# Patient Record
Sex: Male | Born: 1935 | Race: White | Hispanic: No | Marital: Married | Smoking: Former smoker
Health system: Southern US, Community
[De-identification: ages and names within clinical notes are randomized; demographics above are authoritative.]

## PROBLEM LIST (undated history)

## (undated) DIAGNOSIS — I10 Essential (primary) hypertension: Secondary | ICD-10-CM

## (undated) HISTORY — DX: Essential (primary) hypertension: I10

## (undated) HISTORY — PX: KNEE SURGERY: SHX244

## (undated) HISTORY — PX: COLON SURGERY: SHX602

---

## 2007-11-02 ENCOUNTER — Encounter: Admission: RE | Admit: 2007-11-02 | Discharge: 2007-11-02 | Payer: Self-pay | Admitting: Gastroenterology

## 2007-11-17 ENCOUNTER — Encounter: Payer: Self-pay | Admitting: General Surgery

## 2007-11-17 ENCOUNTER — Ambulatory Visit: Payer: Self-pay | Admitting: Cardiovascular Disease

## 2007-11-17 ENCOUNTER — Observation Stay (HOSPITAL_COMMUNITY): Admission: EM | Admit: 2007-11-17 | Discharge: 2007-11-18 | Payer: Self-pay | Admitting: Emergency Medicine

## 2007-11-18 ENCOUNTER — Encounter: Payer: Self-pay | Admitting: Cardiovascular Disease

## 2007-11-21 ENCOUNTER — Ambulatory Visit: Payer: Self-pay

## 2007-11-29 ENCOUNTER — Encounter (INDEPENDENT_AMBULATORY_CARE_PROVIDER_SITE_OTHER): Payer: Self-pay | Admitting: General Surgery

## 2007-11-29 ENCOUNTER — Inpatient Hospital Stay (HOSPITAL_COMMUNITY): Admission: RE | Admit: 2007-11-29 | Discharge: 2007-12-04 | Payer: Self-pay | Admitting: General Surgery

## 2007-12-12 ENCOUNTER — Ambulatory Visit: Payer: Self-pay | Admitting: Oncology

## 2007-12-22 ENCOUNTER — Encounter: Admission: RE | Admit: 2007-12-22 | Discharge: 2007-12-22 | Payer: Self-pay | Admitting: General Surgery

## 2008-01-06 ENCOUNTER — Encounter: Admission: RE | Admit: 2008-01-06 | Discharge: 2008-01-06 | Payer: Self-pay | Admitting: Gastroenterology

## 2008-01-18 ENCOUNTER — Inpatient Hospital Stay (HOSPITAL_COMMUNITY): Admission: RE | Admit: 2008-01-18 | Discharge: 2008-01-21 | Payer: Self-pay | Admitting: Orthopedic Surgery

## 2008-07-06 ENCOUNTER — Encounter: Admission: RE | Admit: 2008-07-06 | Discharge: 2008-07-06 | Payer: Self-pay | Admitting: Gastroenterology

## 2009-03-18 ENCOUNTER — Encounter
Admission: RE | Admit: 2009-03-18 | Discharge: 2009-03-18 | Payer: Self-pay | Admitting: Physical Medicine and Rehabilitation

## 2010-02-23 HISTORY — PX: PROSTATE SURGERY: SHX751

## 2010-07-08 NOTE — Discharge Summary (Signed)
NAME:  David [name], David [name] NO.:  [id number]   MEDICAL RECORD NO.:  [id number]          PATIENT TYPE:  INP   LOCATION:  1612                         FACILITY:  [name]   PHYSICIAN:  Ollen Gross, M.D.    DATE OF BIRTH:  [birth year]   DATE OF ADMISSION:  01/18/2008  DATE OF DISCHARGE:  01/21/2008                               DISCHARGE SUMMARY   ADMITTING DIAGNOSES:  1. Osteoarthritis right knee.  2. Hypertension.  3. Past history of bradycardia (recent stress test and recent chest x-      ray and EKG).  4. Emphysema.  5. Urinary incontinence.  6. Renal calculi.  7. Questionable liver cysts, MRI pending, results unknown.   DISCHARGE DIAGNOSES:  1. Osteoarthritis right knee, status post right total knee replacement      arthroplasty.  2. Preop hyperkalemia, resolved.  3. Osteoarthritis right knee.  4. Hypertension.  5. Past history of bradycardia (recent stress test and recent chest x-      ray and EKG).  6. Emphysema.  7. Urinary incontinence.  8. Renal calculi.  9. Questionable liver cysts, MRI pending, results unknown.  10.Postop hyponatremia, improved.   PROCEDURE:  On 01/18/2008, right total knee surgery by Dr Lequita Halt,  assistant Avel Peace PA-C.  Anesthesia:  General.   CONSULTS:  None.   BRIEF HISTORY:  The patient is a 77 year old male with end-stage  arthritis right knee, progressive worsening pain and dysfunction, failed  outpatient regimen including injection, now presents for right total  knee arthroplasty.   LABORATORY DATA:  Preop CBC showed hemoglobin of 13.8, hematocrit 40.2,  white cell count 5.7, platelets 237.  Postop hemoglobin 11.4, then 10.4,  last H/H with a crit of 29.6.  PT/PTT preop 13 and 0.7 respectively.  INR 1. Serial protimes were followed.  PT/INR 22.5/1.9.  Chem panel on  admission showed a potassium at 3.2.  Remaining chem panel within normal  limits.  Serial electrolytes followed.  Potassium came up to 3.3, last  level  at 3.6.  Sodium dropped from 141  to 134, back up to 136. Preop  urinalysis negative.   EKG on 01/17/2008 showed sinus bradycardia, marked sinus arrhythmia,  minimal voltage criteria, no old tracing, ST elevation, consider early  repolarization.  Confirmed by Dr. Chanda Busing.   HOSPITAL COURSE:  The patient was admitted to [facility],  tolerated procedure well, later transferred to recovery and the  orthopedic floor, started on PCA for pain control.  Following surgery  doing pretty well.  On the morning of day #1 started getting up out of  bed, ambulating with therapy, did have a little low potassium preop so  we put him on some potassium supplement.  By day #2, pain was under good  control, potassium was back up to 3.6, walking over 200 feet, up to 280  on postop day #2, dressing change and incision looked good.  By day #3,  pain was under excellent control, progressing well, meeting goals with  therapy and was discharged home.   DISCHARGE/PLAN:  Patient discharged home on 01/21/2008.  DISCHARGE MEDICATIONS:  1. Percocet.  2. Robaxin.  3. Coumadin.   DIET:  As tolerated.   FOLLOWUP:  Two weeks.   DISPOSITION:  Home.   CARDIAC ASSESSMENT:  Improved.      [name], P.A.C.      Ollen Gross, M.D.  Electronically Signed    ALP/MEDQ  D:  03/07/2008  T:  03/08/2008  Job:  [id number]   cc:   Ollen Gross, M.D.  Fax: [phone]   Fredia Beets, MD   Griffith Citron, M.D.  Fax: [phone]

## 2010-07-08 NOTE — Op Note (Signed)
NAME:  ZAINE, ELSASS NO.:  [id number]   MEDICAL RECORD NO.:  [id number]           PATIENT TYPE:   LOCATION:                                 FACILITY:   PHYSICIAN:  Ollen Gross, M.D.    DATE OF BIRTH:  February 27, 1933   DATE OF PROCEDURE:  01/18/2008  DATE OF DISCHARGE:                               OPERATIVE REPORT   PREOPERATIVE DIAGNOSIS:  Osteoarthritis right knee.   POSTOPERATIVE DIAGNOSIS:  Osteoarthritis right knee.   PROCEDURE:  Right total knee arthroplasty.   SURGEON:  Ollen Gross, M.D.   ASSISTANT:  [name], P.A.C.   ANESTHESIA:  General with postop Marcaine pain pump.   ESTIMATED BLOOD LOSS:  Minimal.   DRAIN:  None.   TOURNIQUET TIME:  41 minutes at 300 mmHg.   COMPLICATIONS:  None.   CONDITION:  Stable to recovery.   BRIEF CLINICAL NOTE:  Mr. Bielby is a 77 year old male with end-stage  arthritis of the right knee with progressively worsening pain and  dysfunction.  He has failed nonoperative management including injections  and presents now for right total knee arthroplasty.   PROCEDURE IN DETAIL:  After successful administration of general  anesthetic a tourniquet was placed high on the right thigh and right  lower extremity prepped and draped in the usual sterile fashion.  Extremity was wrapped in Esmarch, knee flexed, tourniquet inflated to  300 mmHg.  Midline incision was made with a 10 blade through  subcutaneous tissue to the level of the extensor mechanism.  A fresh  blade was used to make a medial parapatellar arthrotomy.  Soft tissue of  the proximal medial tibia is subperiosteally elevated to the joint line  with the knife and into the semimembranosus bursa with a Cobb elevator.  Soft tissue laterally is elevated with attention being paid to avoid the  patellar tendon on tibial tubercle.  Patella was subluxed laterally and  knee flexed 90 degrees.  ACL and PCL removed.  Drill was used to create  a starting  hole in the distal femur.  The canal was thoroughly  irrigated.  The 5 degree right valgus alignment guide is placed  referencing off the posterior condyles rotation is marked and the block  pinned to remove 11 mm off the distal femur.  I took 11 because of preop  flexion contracture.  Distal femoral resection is made with an  oscillating saw.  Sizing block was placed, size 5 is most appropriate.  Rotation is marked at the epicondylar axis.  Size 5 cutting block was  placed and the anterior-posterior and chamfer cuts were made.   The tibia is subluxed forward and the menisci removed.  Extramedullary  tibial alignment guide is placed referencing proximally at the medial  aspect of the tibial tubercle and distally along the second metatarsal  axis and tibial crest.  Block is pinned to remove 10 mm off the  nondeficient lateral side.  Tibial resection is made with an oscillating  saw.  Size 5 is the most appropriate tibial component and the proximal  tibia is prepared  with the modular drill and keel punch for the size 5.  Femoral preparation is completed with the intercondylar cut.   Size 5 mobile bearing tibial trial, size 5 posterior stabilized femoral  trial and a 10 mm posterior stabilized rotating platform insert trial  are placed.  With the 10 full extension is achieved with excellent varus-  valgus, anterior-posterior balance throughout full range of motion.  The  patella was everted and thickness measured to be 26 mm.  Freehand  resection was taken to 14 mm, 41 template was placed, lug holes were  drilled, trial patella was placed and it tracks normally.  Osteophytes  removed off the posterior femur with the trial in place.  All trials  were removed and the cut bone surfaces are prepared with pulsatile  lavage.  Cement is mixed and once ready for implantation the size 5  mobile bearing tibial tray, size 5 posterior stabilized femur and 41  patella are cemented into place.  Patella  was held with a clamp.  Trial  10 mm insert was placed and knee held in full extension and all extruded  cement removed.  When the cement has fully hardened then the permanent  10 mm posterior stabilized rotating platform insert was placed into the  tibial tray.  The wound was copiously irrigated with saline solution and  then FloSeal injected onto the posterior capsule of the medial and  lateral gutters and suprapatellar area.  Moist sponge is placed and  tourniquet released for the total time of 41 minutes.  Minimal bleeding  was encountered.  The bleeding that is encountered was stopped with  electrocautery.  The wound was again irrigated and the arthrotomy closed  with interrupted #1 PDS.  Flexion against gravity to 140 degrees.  Subcu  was closed with interrupted 2-0 Vicryl, subcuticular running 4-0  Monocryl.  The incision was cleaned and dried and then the catheter for  the Marcaine pain pump is placed and the pump was initiated.  Steri-  Strips and bulky sterile dressing are applied and he was placed into a  knee immobilizer, awakened and transported to recovery in stable  condition.      Ollen Gross, M.D.  Electronically Signed     FA/MEDQ  D:  01/18/2008  T:  01/18/2008  Job:  [id number]

## 2010-07-08 NOTE — Discharge Summary (Signed)
NAME:  David [name], David [name] NO.:  [id number]   MEDICAL RECORD NO.:  [id number]          PATIENT TYPE:  INP   LOCATION:  5149                         FACILITY:  [facility]   PHYSICIAN:  Ollen Gross. Vernell Morgans, M.D. DATE OF BIRTH:  13-Sep-1933   DATE OF ADMISSION:  11/29/2007  DATE OF DISCHARGE:  12/04/2007                               DISCHARGE SUMMARY   [name] is a 77 year old gentleman who had a right colon adenoma with  high-grade dysplasia.  He was brought to the operating room and  underwent a right laparoscopic-assisted right colectomy on November 29, 2007.  He tolerated the surgery well.  He was placed on the enteric  protocol prior to surgery.  On postop day 1, his blood pressure was a  little bit high.  He was restarted on his home doses of atenolol and  hydrochlorothiazide.  He was maintained on clears until December 02, 2007,  at which time his bowel function began to return.  He was advanced to  full liquids.  He gradually improved and by December 04, 2007, he was  tolerating a diet, ambulating without difficulty.  His pain was under  control with p.o. pain medicine and he was ready for discharge home.   MEDICATIONS:  He is to resume his home meds.  He was given a  prescription for pain medicine.   ACTIVITIES:  No heavy lifting.   DIET:  As tolerated.   FINAL DIAGNOSIS:  Right colon adenoma with dysplasia and follow up with  Dr. Carolynne Edouard in a week and he is discharged home.      Ollen Gross. Vernell Morgans, M.D.  Electronically Signed     PST/MEDQ  D:  01/31/2008  T:  01/31/2008  Job:  [id number]

## 2010-07-08 NOTE — Op Note (Signed)
NAME:  David [name], David [name]                 ACCOUNT NO.:  [id number]   MEDICAL RECORD NO.:  [id number]          PATIENT TYPE:  INP   LOCATION:  5120                         FACILITY:  [facility]   PHYSICIAN:  Ollen Gross. Vernell Morgans, M.D. DATE OF BIRTH:  Sep 02, 1933   DATE OF PROCEDURE:  11/29/2007  DATE OF DISCHARGE:                               OPERATIVE REPORT   PREOPERATIVE DIAGNOSES:  Right colon adenoma with high-grade dysplasia.   POSTOPERATIVE DIAGNOSES:  Right colon adenoma with high-grade dysplasia.   PROCEDURE:  Laparoscopic-assisted right colectomy.   SURGEON:  Ollen Gross. Vernell Morgans, MD   ASSISTANT:  Alfonse Ras, MD   ANESTHESIA:  General endotracheal.   PROCEDURE:  After informed consent was obtained, the patient was brought  to the operating room, and placed in a supine position on the operating  table.  After adequate induction of general anesthesia, the patient's  abdomen was prepped with Betadine and draped in usual sterile manner.  The area above the umbilicus was infiltrated with 0.25% Marcaine.  A  small incision was made with a 15-blade knife.  This incision was  carried down through the subcutaneous tissue bluntly with hemostat and  Army-[employer]y retractors until linea alba was identified.  The linea alba  was incised with a 15-blade knife.  Each side was grasped with Kocher  clamps and elevated anteriorly.  The preperitoneal space was then probed  bluntly with hemostat until the peritoneum was opened and access was  gained to the abdominal cavity.  A 0 Vicryl pursestring stitch was  placed in the fascia around the opening site.  A Hasson cannula was  placed through the opening and anchored in place at the previous with 0  Vicryl pursestring stitch.  The abdomen was then insufflated with carbon  dioxide without difficulty.  The patient was rotated with the right side  up.  Three sites were then chosen on the lower portion of the abdomen  for placement of a 5-mm port.  Each of  these areas were infiltrated with  0.25% Marcaine.  Small stab incisions were made with a 15-blade knife  and 5-mm ports were placed bluntly through these incisions into the  abdominal cavity under direct vision.  Using a Glassman grasper and  harmonic scalpel, the right colon was identified and was mobilized  medially by incising its retroperitoneal attachment along the white line  of Toldt.  The attachments of omentum to the right colon were taken down  also sharply with the harmonic scalpel.  The plane between the  transverse colon and duodenum and gallbladder was identified and this  was also opened up sharply with the harmonic scalpel under direct  vision.  This allowed [place] to bluntly mobilize the colon more medially.  Once this was accomplished, a right midabdomen transverse incision was  made laterally on the abdomen overlying the right colon.  This was done  with a 10-blade knife.  This incision was carried down through the skin,  subcutaneous tissue, fascia, and muscle sharply with the electrocautery  into the abdomen was entered.  We were able  to bring the small bowel and  terminal ileum up into the wound.  The site was chosen for division of  the small bowel at this point.  The mesentery at this point was opened  sharply with the electrocautery.  A GIA-75 stapler was placed across the  small bowel at this point, clamped and fired, thereby dividing the bowel  between staple lines.  The hepatic flexure was then also able to be  brought up into the wound.  A site was chosen for division of the colon  at this point and the mesentery was opened sharply with the  electrocautery.  A GIA stapler was then placed across the colon at this  point, clamped and fired, thereby dividing the colon between staple  lines and mesentery.  The right colon was then taken down with the  LigaSure.  The main right colic artery was then clamped near its base  with a Kelly clamp.  It was ligated and divided  with the LigaSure and  then the stay side of the artery was doubly ligated with two 2-0 silk  ties.  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  No other abnormalities  were noted on inspecting the abdominal cavity.  The terminal ileum and  transverse colon reached each other easily without any tension.  The two  limbs of bowel were held grossly approximated with a couple of  interrupted 3-0 silk stitches.  Small enterotomies were made on the  antimesenteric border near the staple line of each limb of intestine.  The GIA 75 stapler was then placed down the appropriate limb of colon or  small bowel.  It was then clamped and fired, thereby creating a nice  widely patent enteroenterostomy.  The anastomosis was inspected and  appeared to be intact and hemostatic.  The common enterotomy was then  closed with a TA-60 stapler.  Crotch stitch of 3-0 silk was then placed  at the distal into the anastomosis.  The mesentery was then closed with  multiple simple 2-0 silk stitches.  Once this was accomplished, the  anastomosis was then tacked, was able to be drop back into the abdomen  without difficulty.  The would was then irrigated with copious amounts  of saline.  The operative bed appeared to be hemostatic.  The ports were  then removed.  The fascial defect at the supraumbilical port was closed  with the pursestring stitch.  The deep fascial layer of the abdominal  wall was then closed with a running #1 double-stranded loop PDS.  The  superficial fascial layer was then closed with a single-stranded #1 PDS  stitch.  Each layer was irrigated with copious amounts of saline.  The  subcutaneous tissue was irrigated with saline.  Skin was then closed  with staples.  Sterile dressings were applied.  The patient tolerated  the procedure well.  At the end of the case, all  needle, sponge, and  instrument counts were correct.  The patient was then awakened and taken  to the recovery room in stable condition.      Ollen Gross. Vernell Morgans, M.D.  Electronically Signed     PST/MEDQ  D:  11/29/2007  T:  11/30/2007  Job:  [id number]

## 2010-07-08 NOTE — H&P (Signed)
NAME:  David [name], David [name] NO.:  [id number]   MEDICAL RECORD NO.:  [id number]          PATIENT TYPE:  INP   LOCATION:  NA                           FACILITY:  [facility]   PHYSICIAN:  Ollen Gross, M.D.    DATE OF BIRTH:  [birth year]   DATE OF ADMISSION:  01/18/2008  DATE OF DISCHARGE:                              HISTORY & PHYSICAL   DATE OF ADMISSION:  January 18, 2008   CHIEF COMPLAINTS:  Right knee pain.   HISTORY OF PRESENT ILLNESS:  The patient is a 77-year male seen by Dr.  Lequita Halt for ongoing bilateral knee pain right, more problematic and  symptomatic than the left knee.  Has been ongoing for quite some time  now.  He has been seen in the office and evaluated by Dr. Lequita Halt and  found to have moderate advanced arthritis in the right knee, medial and  patellofemoral compartments.  He has reached a point where he is having  significant pain, felt to benefit from surgery.  Risks and benefits have  been discussed and he elects to surgery.   ALLERGIES:  NO KNOWN DRUG ALLERGIES.   CURRENT MEDICATIONS:  Probiotic formula red yeast rice, glucosamine,  Resveratrol,  potassium, atenolol/chlorthalidone, finasteride, Vesicare,  calcium, aspirin, grape seed extract, Prostate Complete, zinc, psyllium,  magnesium,  Estra-C, gluten, folic acid, and B complex.   PAST MEDICAL HISTORY:  1. Hypertension.  2. Prior history of bradycardia for which he was hospitalized      approximately 5 to 6 weeks ago.  Did have a recent stress test so      he has had a recent chest x-ray and EKG.  3. Emphysema.  4. Urinary incontinence.  5. Kidney stones.  6. Questionable liver cyst.  He had an MRI pending at the time of his      preop evaluation, results unknown.   PAST SURGICAL HISTORY:  1. Appendectomy.  2. Tonsillectomy.  3. Kidney stone surgery.  4. Colon surgery with a partial colectomy for precancerous tumor.   FAMILY HISTORY:  Father deceased age 89 with MI.  Mother deceased  at 41  of breast cancer.   SOCIAL HISTORY:  Married, quit smoking about 40 years ago.  No alcohol.  Three children.  He does have a caregiver lined up has a one-story home  with one step entering   REVIEW OF SYSTEMS:  GENERAL:  No fever, chills or night sweats.  NEUROLOGIC:  No seizures, syncope or paralysis.  RESPIRATORY:  A little  bit of shortness of breath on exertion but no shortness of breath at  rest.  No  productive cough or hemoptysis.  CARDIOVASCULAR:  No chest  pain, angina or orthopnea.  GI:  No nausea, vomiting, diarrhea, or  constipation.  GU:  No dysuria, hematuria or discharge.  MUSCULOSKELETAL:  Pertinent to that of the left hip found in history of  present illness.   PHYSICAL EXAMINATION:  VITAL SIGNS:  Pulse 60, respirations 12, blood  pressure 156/60.  GENERAL:  A 77 year old white male,  well-nourished, well-developed in  no acute distress.  Alert, oriented, cooperative, pleasant.  HEENT:  Normocephalic, atraumatic.  Pupils are round and reactive.  Oropharynx clear.  EOMs are intact.  NECK:  Supple.  CHEST:  Clear.  Anterior and posterior chest wall.  No rhonchi, rales or  wheezing.  HEART:  Regular rate and rhythm with a grade 2/6 systolic ejection  murmur best heard aortic, pulmonic and slightly over Erb's point.  ABDOMEN:  Slightly round.  Bowel sounds present.  RECTAL/BREASTS/GENITALIA:  Not done and not pertinent to present  illness.  EXTREMITIES:  Right knee range of motion 5-125, slight varus  malalignment deformity.  Marked crepitus is noted.   IMPRESSION:  Osteoarthritis, right knee.   PLAN:  The patient will be admitted to [facility] to undergo  right total knee replacement arthroplasty.  Surgery will be performed by  Dr. Ollen Gross.      [name], P.A.C.      Ollen Gross, M.D.  Electronically Signed    ALP/MEDQ  D:  01/15/2008  T:  01/16/2008  Job:  [id number]   cc:   Dr. Jenne Campus   Dr. Duffy Bruce

## 2010-07-08 NOTE — H&P (Signed)
NAME:  David [name], David [name] NO.:  [id number]   MEDICAL RECORD NO.:  [id number]          PATIENT TYPE:  INP   LOCATION:  3733                         FACILITY:  [facility]   PHYSICIAN:  Verne Carrow, MDDATE OF BIRTH:  18-May-1933   DATE OF ADMISSION:  11/17/2007  DATE OF DISCHARGE:                              HISTORY & PHYSICAL   PRIMARY CARDIOLOGIST:  Verne Carrow, MD   PRIMARY CARE PHYSICIAN:  Eustaquio Boyden, MD   HISTORY OF PRESENT ILLNESS:  This is a 77 year old Caucasian male with  history of hypertension, hyperlipidemia, and colon mass who was sent to  the hospital today for preop labs for planned colon resection next week.  In short-stay, the patient was found to have an abnormal EKG with poor R-  wave progression and ST elevation in lead 2 only.  The patient has a  nonspecific ST changes inferiorly.  The patient has not had any  complaints of chest pain, shortness of breath, dizziness, diaphoresis,  nausea or vomiting, and no prior history of heart disease with mild  fatigue over 1 year.  As a result of the abnormal EKG, we are asked to  see the patient and admit for further evaluation and testing prior to  the patient's planned surgery.   Review of systems are negative for all systems, which have been  interviewed by Dr. Clifton James.   PAST MEDICAL HISTORY:  1. Hypertension.  2. Hyperlipidemia.  3. Colon mass of unknown etiology for planned colon resection in 1      week.   PAST SURGICAL HISTORY:  Appendectomy.   SOCIAL HISTORY:  He is a 60-pack-year smoker, but stopped in 1970.  He  is married, living with his wife.  He does not smoke.  He does not drink  alcohol.   FAMILY HISTORY:  Mother deceased with breast cancer at 109.  Father  deceased from an MI at age 33.  He has a sister who is deceased from  breast cancer.   CURRENT MEDICATIONS AT HOME:  1. Atenolol 50 mg once a day.  2. Klor-Con 10 mEq daily.  3. Propecia daily.   ALLERGIES:  No known drug allergies.   LABS:  Point of care; CK 130, MB 1.8, troponin less than 0.05.  CBC and  chemistries are pending.  EKG revealing normal sinus rhythm with  ventricular rate of 52 beats per minute with ST elevation in V2 only  with nonspecific changes in 2, 3 and aVF.   PHYSICAL EXAMINATION:  GENERAL:  He is an awake, alert, and oriented in  no acute distress.  HEENT:  Head is normocephalic and atraumatic.  Eyes, PERRLA.  Mucous  membranes and mouth pink and moist.  Tongue is midline.  NECK:  Supple without JVD or carotid bruits appreciated.  CARDIOVASCULAR:  Regular rate and rhythm without murmurs, rubs or  gallops.  LUNGS:  Clear to auscultation without wheezes, rales, or rhonchi.  ABDOMEN:  Soft, nontender, 2+ bowel sounds.  EXTREMITIES:  Without clubbing, cyanosis, or edema.  NEUROLOGIC:  Cranial nerves II through XII are grossly intact.  Chest x-ray results are pending.   PLAN:  This is a 77 year old Caucasian male with history of hypertension  and hyperlipidemia with strong family history of CAD without any  symptoms of pain, shortness of breath, who was admitted from short-stay  secondary to abnormal an EKG showing nonspecific changes inferiorly and  ST elevation in V2 only.  The patient will be admitted for 23-hour  observation.  We will rule out MI with serial cardiac enzymes and check  echocardiogram today.  If all is negative, may consider stress test in  the a.m. versus cardiac catheterization if results are positive with  cardiac enzyme.  In the interim, we will do an echocardiogram for LV  function and observe making further recommendations based upon test  results.      Bettey Mare. Lyman Bishop, NP      Verne Carrow, MD  Electronically Signed    KML/MEDQ  D:  11/17/2007  T:  11/18/2007  Job:  [id number]   cc:   Eustaquio Boyden, MD

## 2010-07-08 NOTE — Discharge Summary (Signed)
NAME:  David, [name] NO.:  [id number]   MEDICAL RECORD NO.:  [id number]          PATIENT TYPE:  INP   LOCATION:  3733                         FACILITY:  [facility]   PHYSICIAN:  David [name], MDDATE OF BIRTH:  Feb 11, 1934   DATE OF ADMISSION:  11/17/2007  DATE OF DISCHARGE:  11/18/2007                               DISCHARGE SUMMARY   PRIMARY CARDIOLOGIST:  David Carrow, MD   NEW PRIMARY CARE David [name]:  David Boyden, MD   SURGEON:  David Gross. Vernell Morgans, MD   DISCHARGE DIAGNOSIS:  Abnormal electrocardiogram.   SECONDARY DIAGNOSES:  1. Hypertension.  2. Hyperlipidemia.  3. History of colon mass with unknown etiology, plan for colon      resection next week.  4. Status post appendectomy.  5. Tobacco abuse.  6. Family history of coronary artery disease.   ALLERGIES:  No known drug allergies.   PROCEDURE:  2D echocardiogram.   HISTORY OF PRESENT ILLNESS:  A 77 year old Caucasian male without prior  history of coronary artery disease.  He was recently found to have colon  mass and was scheduled for colon resection next week.  He presented to  [facility] Short Stay for preoperative labs and ECG and was noted to  have an abnormal ECG with poor R wave progression and ST elevation in  lead V2 only with nonspecific ST changes in inferior leads.  The patient  denied any chest pain, otherwise hypertensive.  Decision was made to  observe him overnight and cycle cardiac markers.   HOSPITAL COURSE:  The patient ruled out for MI.  He has remained  asymptomatic.  He will undergo 2D echocardiogram prior to discharge and  we have arranged him for an outpatient Myoview on November 21, 2007, at  8 a.m.   DISCHARGE LABORATORIES:  Hemoglobin 15.9, hematocrit 45.9, WBC 7.7, and  platelets 240.  INR 1.0.  Sodium 138, potassium 3.3 which will replace  prior to discharge, chloride 101, CO2 of 29, BUN 12, creatinine 1.09,  glucose 106, calcium 8.9, total bilirubin  120, alkaline phosphatase 66,  AST 51, ALT 19, total protein 6.7, albumin 4.0, and magnesium 2.2.  CK  137, MB 1.8, troponin I 0.02.  Total cholesterol 218, triglycerides 227,  HDL 32, and LDL 141.  CEA 2.0.   DISPOSITION:  The patient is being discharged home today in good  condition.   FOLLOWUP APPOINTMENTS:  We will arrange for an outpatient exercise  Myoview on November 21, 2007, at 8 a.m.  Recommended that the patient  hold his atenolol and avoid caffeine on November 20, 2007.   DISCHARGE MEDICATIONS:  1. Aspirin 81 mg daily.  2. Atenolol/chlorthalidone 50/25 mg daily.  3. Propecia 1 mg daily.   OUTSTANDING LABORATORY STUDIES:  He is pending Myoview and echo.   DURATION OF DISCHARGE/ENCOUNTER:  Thirty-five minutes including  physician time.      David [name], ANP      David Carrow, MD  Electronically Signed    CB/MEDQ  D:  11/18/2007  T:  11/18/2007  Job:  [id number]   cc:  David [name], M.D.  David Boyden, MD

## 2010-11-24 LAB — POCT CARDIAC MARKERS
CKMB, poc: 1.8
Troponin i, poc: <0.05

## 2010-11-24 LAB — CARDIAC PANEL(CRET KIN+CKTOT+MB+TROPI)
CK, MB: 1.8
CK, MB: 2.1
Relative Index: 1.6
Relative Index: 1.7
Total CK: 128
Troponin I: 0.01

## 2010-11-24 LAB — LIPID PANEL
Cholesterol: 218 — ABNORMAL HIGH
LDL Cholesterol: 141 — ABNORMAL HIGH
Total CHOL/HDL Ratio: 6.8
Triglycerides: 227 — ABNORMAL HIGH

## 2010-11-24 LAB — BASIC METABOLIC PANEL
CO2: 29
Calcium: 8.9
Creatinine, Ser: 1.09
GFR calc non Af Amer: >60
Glucose, Bld: 106 — ABNORMAL HIGH

## 2010-11-24 LAB — DIFFERENTIAL
Eosinophils Absolute: 0.4
Lymphs Abs: 1.4
Monocytes Absolute: 0.8
Monocytes Relative: 10

## 2010-11-24 LAB — COMPREHENSIVE METABOLIC PANEL
BUN: 12
CO2: 30
Chloride: 102
Creatinine, Ser: 1.06
Total Bilirubin: 1.8 — ABNORMAL HIGH

## 2010-11-24 LAB — CBC
MCHC: 34.6
MCV: 83.8
RDW: 15

## 2010-11-24 LAB — PROTIME-INR: Prothrombin Time: 14.2

## 2010-11-24 LAB — TSH: TSH: 1.624

## 2010-11-25 LAB — CBC
HCT: 35.6 — ABNORMAL LOW
HCT: 38.2 — ABNORMAL LOW
HCT: 29.6 — ABNORMAL LOW
HCT: 40.2
Hemoglobin: 13.1
Hemoglobin: 10.4 — ABNORMAL LOW
MCHC: 34
MCHC: 34.3
MCHC: 34.4
MCHC: 34.7
MCHC: 35
MCV: 84.2
MCV: 84.4
MCV: 86.5
MCV: 84
MCV: 84.8
Platelets: 157
Platelets: 227
Platelets: 206
Platelets: 237
Platelets: 241
RBC: 4.12 — ABNORMAL LOW
RBC: 4.53
RBC: 3.52 — ABNORMAL LOW
RDW: 14.2
RDW: 14.7
RDW: 14.9
RDW: 15.4
RDW: 16 — ABNORMAL HIGH
RDW: 16 — ABNORMAL HIGH
WBC: 11 — ABNORMAL HIGH
WBC: 11.2 — ABNORMAL HIGH
WBC: 5.7

## 2010-11-25 LAB — BASIC METABOLIC PANEL
BUN: 14
BUN: 17
BUN: 13
CO2: 31
CO2: 31
Calcium: 8.4
Calcium: 8.9
Calcium: 8.9
Calcium: 8.1 — ABNORMAL LOW
Calcium: 8.1 — ABNORMAL LOW
Chloride: 100
Chloride: 97
Chloride: 99
Creatinine, Ser: 1.14
Creatinine, Ser: 1.26
Creatinine, Ser: 1.29
Creatinine, Ser: 1.3
GFR calc Af Amer: 57 — ABNORMAL LOW
GFR calc Af Amer: >60
GFR calc non Af Amer: 47 — ABNORMAL LOW
GFR calc non Af Amer: 56 — ABNORMAL LOW
GFR calc non Af Amer: >60
GFR calc non Af Amer: 55 — ABNORMAL LOW
Glucose, Bld: 102 — ABNORMAL HIGH
Glucose, Bld: 135 — ABNORMAL HIGH
Glucose, Bld: 96
Glucose, Bld: 121 — ABNORMAL HIGH
Potassium: 2.9 — ABNORMAL LOW
Potassium: 3.7
Sodium: 138
Sodium: 139

## 2010-11-25 LAB — DIFFERENTIAL
Basophils Absolute: 0
Basophils Absolute: 0
Basophils Absolute: 0.1
Basophils Relative: 0
Basophils Relative: 0
Basophils Relative: 1
Eosinophils Absolute: 0.3
Eosinophils Absolute: 0.3
Eosinophils Absolute: 0.3
Eosinophils Relative: 3
Eosinophils Relative: 4
Lymphocytes Relative: 10 — ABNORMAL LOW
Lymphocytes Relative: 7 — ABNORMAL LOW
Lymphs Abs: 1.1
Lymphs Abs: 1.2
Monocytes Absolute: 1.1 — ABNORMAL HIGH
Monocytes Relative: 11
Monocytes Relative: 13 — ABNORMAL HIGH
Neutro Abs: 9.9 — ABNORMAL HIGH
Neutrophils Relative %: 77
Neutrophils Relative %: 81 — ABNORMAL HIGH
Neutrophils Relative %: 84 — ABNORMAL HIGH

## 2010-11-25 LAB — APTT: aPTT: 30

## 2010-11-25 LAB — COMPREHENSIVE METABOLIC PANEL
ALT: 16
AST: 20
Albumin: 4.1
Albumin: 3.7
Alkaline Phosphatase: 66
Alkaline Phosphatase: 58
BUN: 18
Calcium: 9.3
GFR calc Af Amer: >60
Potassium: 3.2 — ABNORMAL LOW
Potassium: 3.2 — ABNORMAL LOW
Sodium: 138
Total Protein: 6.9
Total Protein: 6.5

## 2010-11-25 LAB — PROTIME-INR
INR: 1
INR: 1.2
INR: 1.9 — ABNORMAL HIGH
Prothrombin Time: 15.5 — ABNORMAL HIGH
Prothrombin Time: 19.4 — ABNORMAL HIGH

## 2010-11-25 LAB — URINALYSIS, ROUTINE W REFLEX MICROSCOPIC
Bilirubin Urine: NEGATIVE
Hgb urine dipstick: NEGATIVE
Protein, ur: NEGATIVE
Urobilinogen, UA: 0.2

## 2010-11-25 LAB — GLUCOSE, CAPILLARY: Glucose-Capillary: 153 — ABNORMAL HIGH

## 2010-11-25 LAB — ABO/RH: ABO/RH(D): O POS

## 2010-11-25 LAB — TYPE AND SCREEN: Antibody Screen: NEGATIVE

## 2012-01-04 ENCOUNTER — Encounter (INDEPENDENT_AMBULATORY_CARE_PROVIDER_SITE_OTHER): Payer: Self-pay | Admitting: General Surgery

## 2012-01-04 ENCOUNTER — Ambulatory Visit (INDEPENDENT_AMBULATORY_CARE_PROVIDER_SITE_OTHER): Payer: Medicare Other | Admitting: General Surgery

## 2012-01-04 VITALS — BP 140/76 | HR 60 | Temp 98.6°F | Resp 18 | Ht 72.0 in | Wt 220.0 lb

## 2012-01-04 DIAGNOSIS — M6208 Separation of muscle (nontraumatic), other site: Secondary | ICD-10-CM

## 2012-01-04 DIAGNOSIS — M6200 Separation of muscle (nontraumatic), unspecified site: Secondary | ICD-10-CM

## 2012-01-04 DIAGNOSIS — K439 Ventral hernia without obstruction or gangrene: Secondary | ICD-10-CM

## 2012-01-04 HISTORY — DX: Separation of muscle (nontraumatic), other site: M62.08

## 2012-01-04 NOTE — Progress Notes (Signed)
Subjective:     Patient ID: David [name], male   DOB: October 17, 1933, 78 y.o.   MRN: [id number]  HPI We are asked to see this patient in consultation by Dr. Judith Blonder to evaluate him for a ventral hernia. The patient is a 78 year old white male who has noticed for a number of years a bulge along his upper abdomen. He does note some occasional discomfort with it. He is also occasionally experience some nausea but no vomiting. He has been able to maintain his weight. His bowels are moving regularly.  Review of Systems  Constitutional: Negative.   HENT: Negative.   Eyes: Negative.   Respiratory: Negative.   Cardiovascular: Negative.   Gastrointestinal: Negative.   Genitourinary: Negative.   Musculoskeletal: Negative.   Skin: Negative.   Neurological: Negative.   Hematological: Negative.   Psychiatric/Behavioral: Negative.        Objective:   Physical Exam  Constitutional: He is oriented to person, place, and time. He appears well-developed and well-nourished.  HENT:  Head: Normocephalic and atraumatic.  Eyes: Conjunctivae normal and EOM are normal. Pupils are equal, round, and reactive to light.  Neck: Normal range of motion. Neck supple.  Cardiovascular: Normal rate, regular rhythm and normal heart sounds.   Pulmonary/Chest: Effort normal and breath sounds normal.  Abdominal: Soft. Bowel sounds are normal.       The patient has a normal rectus diastases along his upper midline abdomen. No evidence of hernia.  Musculoskeletal: Normal range of motion.  Neurological: He is alert and oriented to person, place, and time.  Skin: Skin is warm and dry.  Psychiatric: He has a normal mood and affect. His behavior is normal.       Assessment:     The patient has a rectus diastases of his upper abdominal wall. This is a variant of normal. Unfortunately there is nothing to do surgically to try to fix this without weakening of his abdominal wall.    Plan:     At this point he may return all his  normal activities without any restrictions. He can followup with [place] on a when necessary basis.

## 2014-06-04 NOTE — Progress Notes (Signed)
Surgery on 06/18/2014  Preop on 06/14/14 at 130pm.  Need orders in EPIC.  Thank You.

## 2014-06-05 ENCOUNTER — Ambulatory Visit: Payer: Self-pay | Admitting: Orthopedic Surgery

## 2014-06-05 NOTE — Progress Notes (Signed)
Preoperative surgical orders have been place into the Epic hospital system for David [name] on 06/05/2014, 5:50 PM  by Mickel Crow for surgery on 06-18-2014.  Preop Knee orders including IV Tylenol and IV Decadron as long as there are no contraindications to the above medications. David Muslim, PA-C

## 2014-06-13 NOTE — Patient Instructions (Signed)
David [name]  06/13/2014   Your procedure is scheduled on: Monday 06/18/14  Report to [facility] Main  Entrance and follow signs to               [place] at 12:55 PM.  Call this number if you have problems the morning of surgery [phone]   Remember:  Do not eat food or drink liquids :After Midnight.   You may have Clear Liquids up until 9:55AM the morning of surgery.    Take these medicines the morning of surgery with A SIP OF WATER: Norvasc                               You may not have any metal on your body including hair pins and              piercings  Do not wear jewelry, make-up, lotions, powders or perfumes.             Do not wear nail polish.  Do not shave  48 hours prior to surgery.              Men may shave face and neck.   Do not bring valuables to the hospital. [name].  Contacts, dentures or bridgework may not be worn into surgery.  Leave suitcase in the car. After surgery it may be brought to your room.     Patients discharged the day of surgery will not be allowed to drive home.  Name and phone number of your driver:  Special Instructions: N/A              Please read over the following fact sheets you were given: _____________________________________________________________________             [facility] - Preparing for Surgery Before surgery, you can play an important role.  Because skin is not sterile, your skin needs to be as free of germs as possible.  You can reduce the number of germs on your skin by washing with CHG (chlorahexidine gluconate) soap before surgery.  CHG is an antiseptic cleaner which kills germs and bonds with the skin to continue killing germs even after washing. Please DO NOT use if you have an allergy to CHG or antibacterial soaps.  If your skin becomes reddened/irritated stop using the CHG and inform your nurse when you arrive at Short Stay. Do  not shave (including legs and underarms) for at least 48 hours prior to the first CHG shower.  You may shave your face/neck. Please follow these instructions carefully:  1.  Shower with CHG Soap the night before surgery and the  morning of Surgery.  2.  If you choose to wash your hair, wash your hair first as usual with your  normal  shampoo.  3.  After you shampoo, rinse your hair and body thoroughly to remove the  shampoo.                           4.  Use CHG as you would any other liquid soap.  You can apply chg directly  to the skin and wash  Gently with a scrungie or clean washcloth.  5.  Apply the CHG Soap to your body ONLY FROM THE NECK DOWN.   Do not use on face/ open                           Wound or open sores. Avoid contact with eyes, ears mouth and genitals (private parts).                       Wash face,  Genitals (private parts) with your normal soap.             6.  Wash thoroughly, paying special attention to the area where your surgery  will be performed.  7.  Thoroughly rinse your body with warm water from the neck down.  8.  DO NOT shower/wash with your normal soap after using and rinsing off  the CHG Soap.                9.  Pat yourself dry with a clean towel.            10.  Wear clean pajamas.            11.  Place clean sheets on your bed the night of your first shower and do not  sleep with pets. Day of Surgery : Do not apply any lotions/deodorants the morning of surgery.  Please wear clean clothes to the hospital/surgery center.  FAILURE TO FOLLOW THESE INSTRUCTIONS MAY RESULT IN THE CANCELLATION OF YOUR SURGERY PATIENT SIGNATURE_________________________________  NURSE SIGNATURE__________________________________  ________________________________________________________________________   David [name]  An incentive spirometer is a tool that can help keep your lungs clear and active. This tool measures how well you are filling your  lungs with each breath. Taking long deep breaths may help reverse or decrease the chance of developing breathing (pulmonary) problems (especially infection) following:  A long period of time when you are unable to move or be active. BEFORE THE PROCEDURE   If the spirometer includes an indicator to show your best effort, your nurse or respiratory therapist will set it to a desired goal.  If possible, sit up straight or lean slightly forward. Try not to slouch.  Hold the incentive spirometer in an upright position. INSTRUCTIONS FOR USE  1. Sit on the edge of your bed if possible, or sit up as far as you can in bed or on a chair. 2. Hold the incentive spirometer in an upright position. 3. Breathe out normally. 4. Place the mouthpiece in your mouth and seal your lips tightly around it. 5. Breathe in slowly and as deeply as possible, raising the piston or the ball toward the top of the column. 6. Hold your breath for 3-5 seconds or for as long as possible. Allow the piston or ball to fall to the bottom of the column. 7. Remove the mouthpiece from your mouth and breathe out normally. 8. Rest for a few seconds and repeat Steps 1 through 7 at least 10 times every 1-2 hours when you are awake. Take your time and take a few normal breaths between deep breaths. 9. The spirometer may include an indicator to show your best effort. Use the indicator as a goal to work toward during each repetition. 10. After each set of 10 deep breaths, practice coughing to be sure your lungs are clear. If you have an incision (the cut made at the time of surgery),  support your incision when coughing by placing a pillow or rolled up towels firmly against it. Once you are able to get out of bed, walk around indoors and cough well. You may stop using the incentive spirometer when instructed by your caregiver.  RISKS AND COMPLICATIONS  Take your time so you do not get dizzy or light-headed.  If you are in pain, you may need  to take or ask for pain medication before doing incentive spirometry. It is harder to take a deep breath if you are having pain. AFTER USE  Rest and breathe slowly and easily.  It can be helpful to keep track of a log of your progress. Your caregiver can provide you with a simple table to help with this. If you are using the spirometer at home, follow these instructions: [place] IF:   You are having difficultly using the spirometer.  You have trouble using the spirometer as often as instructed.  Your pain medication is not giving enough relief while using the spirometer.  You develop fever of 100.5 F (38.1 C) or higher. SEEK IMMEDIATE MEDICAL CARE IF:   You cough up bloody sputum that had not been present before.  You develop fever of 102 F (38.9 C) or greater.  You develop worsening pain at or near the incision site. MAKE SURE YOU:   Understand these instructions.  Will watch your condition.  Will get help right away if you are not doing well or get worse. Document Released: 06/22/2006 Document Revised: 05/04/2011 Document Reviewed: 08/23/2006 ExitCare Patient Information 2014 [place].   ________________________________________________________________________    CLEAR LIQUID DIET   Foods Allowed                                                                     Foods Excluded  Coffee and tea, regular and decaf                             liquids that you cannot  Plain Jell-O in any flavor                                             see through such as: Fruit ices (not with fruit pulp)                                     milk, soups, orange juice  Iced Popsicles                                    All solid food Carbonated beverages, regular and diet                                    Cranberry, grape and apple juices Sports drinks like Gatorade Lightly seasoned clear broth or consume(fat free) Sugar, honey syrup  Sample Menu Breakfast  Lunch                                     Supper Cranberry juice                    Beef broth                            Chicken broth Jell-O                                     Grape juice                           Apple juice Coffee or tea                        Jell-O                                      Popsicle                                                Coffee or tea                        Coffee or tea  _____________________________________________________________________

## 2014-06-14 ENCOUNTER — Encounter (HOSPITAL_COMMUNITY)
Admission: RE | Admit: 2014-06-14 | Discharge: 2014-06-14 | Disposition: A | Discharge status: Home / Self Care | Payer: Medicare Other | Source: Ambulatory Visit | Attending: Orthopedic Surgery | Admitting: Orthopedic Surgery

## 2014-06-14 DIAGNOSIS — Z01812 Encounter for preprocedural laboratory examination: Secondary | ICD-10-CM | POA: Diagnosis not present

## 2014-06-14 NOTE — Progress Notes (Addendum)
Pt arrived for pre-op appointment. Pt was asked to review the surgical consent and sign when he felt comfortable. He requested to know the specific risks of the surgery. He was told to discuss that with his surgeon, and he can sign the consent the morning of surgery. He stated he does not want to come in the morning of surgery if he is going to refuse. I highly encouraged pt to go ahead with the pre-op appointment and discuss risks with Dr. Wynelle Link after appt. He did not want to pay for the lab work or the EKG. I encouraged him to stay so I could go over medications, health history and other pre-op charting, and he could refuse the labs and EKG until he decided if he wanted to have the surgery. He said "This is just bad business" and stated he wanted to cancel his surgery and get his money back.  I asked the pt if he is certain this is what he wants to do, and he said yes.   Spoke with Abigail Butts at Dr. Anne Fu office to let her know pt wants to cancel his surgery. She plans to follow up with pt this afternoon.  Wyn Quaker

## 2014-06-18 ENCOUNTER — Ambulatory Visit (HOSPITAL_COMMUNITY): Admission: RE | Admit: 2014-06-18 | Payer: Medicare Other | Source: Ambulatory Visit | Admitting: Orthopedic Surgery

## 2014-06-18 ENCOUNTER — Encounter (HOSPITAL_COMMUNITY): Admission: RE | Payer: Self-pay | Source: Ambulatory Visit

## 2014-06-18 SURGERY — BURSECTOMY, KNEE
Anesthesia: Choice | Site: Knee | Laterality: Left

## 2015-04-11 DIAGNOSIS — Z961 Presence of intraocular lens: Secondary | ICD-10-CM | POA: Diagnosis not present

## 2015-04-11 DIAGNOSIS — H04123 Dry eye syndrome of bilateral lacrimal glands: Secondary | ICD-10-CM | POA: Diagnosis not present

## 2015-04-11 DIAGNOSIS — H18413 Arcus senilis, bilateral: Secondary | ICD-10-CM | POA: Diagnosis not present

## 2015-04-11 DIAGNOSIS — I10 Essential (primary) hypertension: Secondary | ICD-10-CM | POA: Diagnosis not present

## 2015-05-07 DIAGNOSIS — Z23 Encounter for immunization: Secondary | ICD-10-CM | POA: Diagnosis not present

## 2015-05-07 DIAGNOSIS — M79672 Pain in left foot: Secondary | ICD-10-CM | POA: Diagnosis not present

## 2015-05-07 DIAGNOSIS — H532 Diplopia: Secondary | ICD-10-CM | POA: Diagnosis not present

## 2015-05-07 DIAGNOSIS — E663 Overweight: Secondary | ICD-10-CM | POA: Diagnosis not present

## 2015-05-07 DIAGNOSIS — I10 Essential (primary) hypertension: Secondary | ICD-10-CM | POA: Diagnosis not present

## 2015-05-14 ENCOUNTER — Encounter: Payer: Self-pay | Admitting: Diagnostic Neuroimaging

## 2015-05-14 ENCOUNTER — Ambulatory Visit (INDEPENDENT_AMBULATORY_CARE_PROVIDER_SITE_OTHER): Payer: PPO | Admitting: Diagnostic Neuroimaging

## 2015-05-14 VITALS — BP 164/63 | HR 53 | Ht 72.0 in | Wt 217.2 lb

## 2015-05-14 DIAGNOSIS — H532 Diplopia: Secondary | ICD-10-CM

## 2015-05-14 DIAGNOSIS — H02403 Unspecified ptosis of bilateral eyelids: Secondary | ICD-10-CM

## 2015-05-14 NOTE — Progress Notes (Signed)
[facility]  PATIENT: David [name] DOB: Nov 06, 1933  REFERRING CLINICIAN: Peter Minium, MD HISTORY FROM: patient  REASON FOR VISIT: new consult    HISTORICAL  CHIEF COMPLAINT:  Chief Complaint  Patient presents with  . Double Vision    rm 6, New Pt, "eyelids dropping, seeing double vision sometimes past 8-10 months"    HISTORY OF PRESENT ILLNESS:   82 year old ambidextrous male with hypertension, here for evaluation of intermittent double vision. For past 8-10 months, patient has had intermittent episodes of blurred vision, with both eyes open, improving with one eye closed. Sometimes this is double vision. This can occur at nighttime, outside, inside, in sunlight or cloudy environment. Sometimes happens when he is watching TV. Patient has history of bilateral ptosis which was treated with eyelid surgery more than 20 years ago. No recurrent ptosis. No speech or swallowing difficulty. No extremity numbness or weakness. No balance difficulty. No other specific triggering right ring factors.  Patient went to eye doctor who referred patient here for further evaluation. No family history of similar problem.   REVIEW OF SYSTEMS: Full 14 system review of systems performed and negative with exception of: Swelling in legs snoring.  ALLERGIES: No Known Allergies  HOME MEDICATIONS: Outpatient Prescriptions Prior to Visit  Medication Sig Dispense Refill  . amLODipine (NORVASC) 10 MG tablet Take 10 mg by mouth every morning.     Marland Kitchen KLOR-CON M20 20 MEQ tablet Take 20 mEq by mouth 2 (two) times daily.     Marland Kitchen OVER THE COUNTER MEDICATION Place 2-3 drops into both eyes daily as needed (dry eyes.).     No facility-administered medications prior to visit.    PAST MEDICAL HISTORY: Past Medical History  Diagnosis Date  . Hypertension     PAST SURGICAL HISTORY: Past Surgical History  Procedure Laterality Date  . Colon surgery    . Knee surgery  2009 or 2010    replaced  knee cap  . Prostate surgery  2012    FAMILY HISTORY: Family History  Problem Relation Age of Onset  . Cancer Mother     breast  . Heart disease Father     heart attack at age 2    SOCIAL HISTORY:  Social History   Social History  . Marital Status: Married    Spouse Name: Hoyle Sauer  . Number of Children: 3  . Years of Education: 12   Occupational History  .      retired   Social History Main Topics  . Smoking status: Former Smoker    Quit date: 05/14/1978  . Smokeless tobacco: Not on file     Comment: stopped 1971  . Alcohol Use: Not on file  . Drug Use: Not on file  . Sexual Activity: Not on file   Other Topics Concern  . Not on file   Social History Narrative   Lives at home with spouse   Caffeine use- occass coffee, tea     PHYSICAL EXAM  GENERAL EXAM/CONSTITUTIONAL: Vitals:  Filed Vitals:   05/14/15 1034  BP: 164/63  Pulse: 53  Height: 6' (1.829 m)  Weight: 217 lb 3.2 oz (98.521 kg)     Body mass index is 29.45 kg/(m^2).  Visual Acuity Screening   Right eye Left eye Both eyes  Without correction: 20/30 20/30   With correction:        Patient is in no distress; well developed, nourished and groomed; neck is supple  CARDIOVASCULAR:  Examination of  carotid arteries is normal; no carotid bruits  Regular rate and rhythm, no murmurs  Examination of peripheral vascular system by observation and palpation is normal  EYES:  Ophthalmoscopic exam of optic discs and posterior segments is normal; no papilledema or hemorrhages  MUSCULOSKELETAL:  Gait, strength, tone, movements noted in Neurologic exam below  NEUROLOGIC: MENTAL STATUS:  No flowsheet data found.  awake, alert, oriented to person, place and time  recent and remote memory intact  normal attention and concentration  language fluent, comprehension intact, naming intact,   fund of knowledge appropriate  CRANIAL NERVE:   2nd - no papilledema on fundoscopic exam  2nd,  3rd, 4th, 6th - pupils equal and reactive to light, visual fields full to confrontation, extraocular muscles intact, no nystagmus  5th - facial sensation symmetric  7th - facial strength symmetric  8th - hearing intact  9th - palate elevates symmetrically, uvula midline  11th - shoulder shrug symmetric  12th - tongue protrusion midline  MOTOR:   normal bulk and tone, full strength in the BUE, BLE  SENSORY:   normal and symmetric to light touch, temperature, vibration  COORDINATION:   finger-nose-finger, fine finger movements normal  REFLEXES:   deep tendon reflexes TRACE and symmetric  GAIT/STATION:   narrow based gait; DIFF WITH TANDEM    DIAGNOSTIC DATA (LABS, IMAGING, TESTING) - I reviewed patient records, labs, notes, testing and imaging myself where available.  Lab Results  Component Value Date   WBC 10.6* 01/21/2008   HGB 10.4* 01/21/2008   HCT 29.6* 01/21/2008   MCV 84.0 01/21/2008   PLT 222 01/21/2008      Component Value Date/Time   NA 136 REPEATED TO VERIFY 01/20/2008 0430   K 3.6 REPEATED TO VERIFY 01/20/2008 0430   CL 99 REPEATED TO VERIFY 01/20/2008 0430   CO2 32 REPEATED TO VERIFY 01/20/2008 0430   GLUCOSE 111* 01/20/2008 0430   BUN 13 01/20/2008 0430   CREATININE 1.29 01/20/2008 0430   CALCIUM 8.1* 01/20/2008 0430   PROT 6.5 01/13/2008 0750   ALBUMIN 3.7 01/13/2008 0750   AST 20 01/13/2008 0750   ALT 18 01/13/2008 0750   ALKPHOS 58 01/13/2008 0750   BILITOT 0.8 01/13/2008 0750   GFRNONAA 55* 01/20/2008 0430   GFRAA  01/20/2008 0430    >60        The eGFR has been calculated using the MDRD equation. This calculation has not been validated in all clinical   Lab Results  Component Value Date   CHOL * 11/18/2007    218        ATP III CLASSIFICATION:  <200     mg/dL   Desirable  200-239  mg/dL   Borderline High  >=240    mg/dL   High   HDL 32* 11/18/2007   LDLCALC * 11/18/2007    141        Total Cholesterol/HDL:CHD  Risk Coronary Heart Disease Risk Table                     Men   Women  1/2 Average Risk   3.4   3.3   TRIG 227* 11/18/2007   CHOLHDL 6.8 11/18/2007   No results found for: HGBA1C No results found for: VITAMINB12 Lab Results  Component Value Date   TSH 1.624 Test methodology is 3rd generation TSH 11/18/2007   11/17/07 CXR [I reviewed images myself and agree with interpretation. -VRP]  - Mild hyperinflation configuration. No acute process  seen.  03/18/09 MRI lumbar spine [I reviewed images myself and agree with interpretation. -VRP]  1. Moderate central canal stenosis at L3-4 greater than L4-5. 2. Disc bulging, facet hypertrophy, and ligamentum flavum thickening contribute at both levels. 3. Lateral recess narrowing is worse on the left at both levels. 4. Mild to moderate foraminal stenosis is present at both levels, worse on the left. 5. Far left lateral disc protrusion at L2-3 without significant stenosis. 6. Asymmetric left-sided facet hypertrophy at L5-S1 without significant stenosis.    ASSESSMENT AND PLAN  82 y.o. year old male here with intermittent double vision with both eyes open for past 8-10 months. Most likely represents misalignment problem which could be related to brain, cranial nerve, extraocular muscle or neuromuscular junction problem. We'll check further testing.   Ddx: myasthenia gravis, thyroid disease, diabetes, cranial neuropathy, CNS vascular  1. Double vision with both eyes open   2. Ptosis, bilateral      PLAN: - ADDITIONAL WORKUP  Orders Placed This Encounter  Procedures  . MR Brain Wo Contrast  . Acetylcholine Receptor, Binding  . TSH  . Hemoglobin A1c   Return in about 1 month (around 06/14/2015).  I reviewed images, labs, notes, records myself. I summarized findings and reviewed with patient, for this high risk condition (new onset double vision, ptosis) requiring high complexity decision making.    Penni Bombard, MD  9/53/2023, 34:35 AM Certified in Neurology, Neurophysiology and Neuroimaging  [facility] [address] [place], [place] [address] [phone]

## 2015-05-14 NOTE — Patient Instructions (Signed)

## 2015-05-15 LAB — ACETYLCHOLINE RECEPTOR, BINDING: AChR Binding Ab, Serum: <0.03 nmol/L (ref 0.00–0.24)

## 2015-05-15 LAB — HEMOGLOBIN A1C
ESTIMATED AVERAGE GLUCOSE: 111 mg/dL
Hgb A1c MFr Bld: 5.5 % (ref 4.8–5.6)

## 2015-05-15 LAB — TSH: TSH: 2.83 u[IU]/mL (ref 0.450–4.500)

## 2015-05-16 ENCOUNTER — Telehealth: Payer: Self-pay | Admitting: *Deleted

## 2015-05-16 NOTE — Telephone Encounter (Signed)
Spoke with patient and informed him last lab result is normal. Confirmed his MRI appointment. He verbalized understanding, appreciation for call.

## 2015-05-27 ENCOUNTER — Other Ambulatory Visit: Payer: Medicare Other

## 2015-06-19 ENCOUNTER — Ambulatory Visit: Payer: PPO | Admitting: Diagnostic Neuroimaging

## 2015-07-10 DIAGNOSIS — Z85820 Personal history of malignant melanoma of skin: Secondary | ICD-10-CM | POA: Diagnosis not present

## 2015-07-10 DIAGNOSIS — Z08 Encounter for follow-up examination after completed treatment for malignant neoplasm: Secondary | ICD-10-CM | POA: Diagnosis not present

## 2015-07-10 DIAGNOSIS — L570 Actinic keratosis: Secondary | ICD-10-CM | POA: Diagnosis not present

## 2015-08-23 DIAGNOSIS — M79662 Pain in left lower leg: Secondary | ICD-10-CM | POA: Diagnosis not present

## 2015-08-23 DIAGNOSIS — S8012XA Contusion of left lower leg, initial encounter: Secondary | ICD-10-CM | POA: Diagnosis not present

## 2015-09-04 DIAGNOSIS — S8012XD Contusion of left lower leg, subsequent encounter: Secondary | ICD-10-CM | POA: Diagnosis not present

## 2015-09-04 DIAGNOSIS — I824Z2 Acute embolism and thrombosis of unspecified deep veins of left distal lower extremity: Secondary | ICD-10-CM | POA: Diagnosis not present

## 2015-09-04 DIAGNOSIS — R600 Localized edema: Secondary | ICD-10-CM | POA: Diagnosis not present

## 2015-09-04 DIAGNOSIS — M79662 Pain in left lower leg: Secondary | ICD-10-CM | POA: Diagnosis not present

## 2015-09-11 DIAGNOSIS — G4730 Sleep apnea, unspecified: Secondary | ICD-10-CM | POA: Diagnosis not present

## 2015-09-16 DIAGNOSIS — S8012XD Contusion of left lower leg, subsequent encounter: Secondary | ICD-10-CM | POA: Diagnosis not present

## 2015-09-16 DIAGNOSIS — L03116 Cellulitis of left lower limb: Secondary | ICD-10-CM | POA: Diagnosis not present

## 2015-10-22 DIAGNOSIS — L03116 Cellulitis of left lower limb: Secondary | ICD-10-CM | POA: Diagnosis not present

## 2015-10-22 DIAGNOSIS — N529 Male erectile dysfunction, unspecified: Secondary | ICD-10-CM | POA: Diagnosis not present

## 2015-10-22 DIAGNOSIS — S8012XD Contusion of left lower leg, subsequent encounter: Secondary | ICD-10-CM | POA: Diagnosis not present

## 2015-10-22 DIAGNOSIS — E663 Overweight: Secondary | ICD-10-CM | POA: Diagnosis not present

## 2015-12-24 DIAGNOSIS — N400 Enlarged prostate without lower urinary tract symptoms: Secondary | ICD-10-CM | POA: Diagnosis not present

## 2016-01-02 DIAGNOSIS — W010XXA Fall on same level from slipping, tripping and stumbling without subsequent striking against object, initial encounter: Secondary | ICD-10-CM | POA: Diagnosis not present

## 2016-01-02 DIAGNOSIS — E785 Hyperlipidemia, unspecified: Secondary | ICD-10-CM | POA: Diagnosis not present

## 2016-01-02 DIAGNOSIS — W228XXA Striking against or struck by other objects, initial encounter: Secondary | ICD-10-CM | POA: Diagnosis not present

## 2016-01-02 DIAGNOSIS — Z7982 Long term (current) use of aspirin: Secondary | ICD-10-CM | POA: Diagnosis not present

## 2016-01-02 DIAGNOSIS — M1990 Unspecified osteoarthritis, unspecified site: Secondary | ICD-10-CM | POA: Diagnosis not present

## 2016-01-02 DIAGNOSIS — Z87891 Personal history of nicotine dependence: Secondary | ICD-10-CM | POA: Diagnosis not present

## 2016-01-02 DIAGNOSIS — J449 Chronic obstructive pulmonary disease, unspecified: Secondary | ICD-10-CM | POA: Diagnosis not present

## 2016-01-02 DIAGNOSIS — S50811A Abrasion of right forearm, initial encounter: Secondary | ICD-10-CM | POA: Diagnosis not present

## 2016-01-02 DIAGNOSIS — S81811A Laceration without foreign body, right lower leg, initial encounter: Secondary | ICD-10-CM | POA: Diagnosis not present

## 2016-01-02 DIAGNOSIS — I10 Essential (primary) hypertension: Secondary | ICD-10-CM | POA: Diagnosis not present

## 2016-01-10 DIAGNOSIS — Z85820 Personal history of malignant melanoma of skin: Secondary | ICD-10-CM | POA: Diagnosis not present

## 2016-01-10 DIAGNOSIS — Z08 Encounter for follow-up examination after completed treatment for malignant neoplasm: Secondary | ICD-10-CM | POA: Diagnosis not present

## 2016-01-10 DIAGNOSIS — L570 Actinic keratosis: Secondary | ICD-10-CM | POA: Diagnosis not present

## 2016-01-13 DIAGNOSIS — I10 Essential (primary) hypertension: Secondary | ICD-10-CM | POA: Diagnosis not present

## 2016-01-13 DIAGNOSIS — L02415 Cutaneous abscess of right lower limb: Secondary | ICD-10-CM | POA: Diagnosis not present

## 2016-01-14 DIAGNOSIS — L02415 Cutaneous abscess of right lower limb: Secondary | ICD-10-CM | POA: Diagnosis not present

## 2016-01-14 DIAGNOSIS — T148XXA Other injury of unspecified body region, initial encounter: Secondary | ICD-10-CM | POA: Diagnosis not present

## 2016-01-27 DIAGNOSIS — M48061 Spinal stenosis, lumbar region without neurogenic claudication: Secondary | ICD-10-CM | POA: Diagnosis not present

## 2016-01-27 DIAGNOSIS — R600 Localized edema: Secondary | ICD-10-CM | POA: Diagnosis not present

## 2016-01-27 DIAGNOSIS — I10 Essential (primary) hypertension: Secondary | ICD-10-CM | POA: Diagnosis not present

## 2016-01-31 DIAGNOSIS — Z471 Aftercare following joint replacement surgery: Secondary | ICD-10-CM | POA: Diagnosis not present

## 2016-01-31 DIAGNOSIS — M25552 Pain in left hip: Secondary | ICD-10-CM | POA: Diagnosis not present

## 2016-01-31 DIAGNOSIS — M25551 Pain in right hip: Secondary | ICD-10-CM | POA: Diagnosis not present

## 2016-01-31 DIAGNOSIS — Z96651 Presence of right artificial knee joint: Secondary | ICD-10-CM | POA: Diagnosis not present

## 2016-02-14 DIAGNOSIS — Z9889 Other specified postprocedural states: Secondary | ICD-10-CM | POA: Diagnosis not present

## 2016-02-14 DIAGNOSIS — T148XXA Other injury of unspecified body region, initial encounter: Secondary | ICD-10-CM | POA: Diagnosis not present

## 2016-02-18 DIAGNOSIS — J209 Acute bronchitis, unspecified: Secondary | ICD-10-CM | POA: Diagnosis not present

## 2016-02-19 DIAGNOSIS — Z9889 Other specified postprocedural states: Secondary | ICD-10-CM | POA: Diagnosis not present

## 2016-02-19 DIAGNOSIS — T148XXA Other injury of unspecified body region, initial encounter: Secondary | ICD-10-CM | POA: Diagnosis not present

## 2016-04-16 DIAGNOSIS — I10 Essential (primary) hypertension: Secondary | ICD-10-CM | POA: Diagnosis not present

## 2016-04-16 DIAGNOSIS — Z961 Presence of intraocular lens: Secondary | ICD-10-CM | POA: Diagnosis not present

## 2016-04-16 DIAGNOSIS — H18413 Arcus senilis, bilateral: Secondary | ICD-10-CM | POA: Diagnosis not present

## 2016-04-16 DIAGNOSIS — H04123 Dry eye syndrome of bilateral lacrimal glands: Secondary | ICD-10-CM | POA: Diagnosis not present

## 2016-04-27 DIAGNOSIS — J449 Chronic obstructive pulmonary disease, unspecified: Secondary | ICD-10-CM | POA: Diagnosis not present

## 2016-04-27 DIAGNOSIS — I10 Essential (primary) hypertension: Secondary | ICD-10-CM | POA: Diagnosis not present

## 2016-04-27 DIAGNOSIS — M159 Polyosteoarthritis, unspecified: Secondary | ICD-10-CM | POA: Diagnosis not present

## 2016-07-15 DIAGNOSIS — Z08 Encounter for follow-up examination after completed treatment for malignant neoplasm: Secondary | ICD-10-CM | POA: Diagnosis not present

## 2016-07-15 DIAGNOSIS — Z85820 Personal history of malignant melanoma of skin: Secondary | ICD-10-CM | POA: Diagnosis not present

## 2016-07-30 DIAGNOSIS — M5416 Radiculopathy, lumbar region: Secondary | ICD-10-CM | POA: Diagnosis not present

## 2016-07-30 DIAGNOSIS — M48061 Spinal stenosis, lumbar region without neurogenic claudication: Secondary | ICD-10-CM | POA: Diagnosis not present

## 2016-08-17 DIAGNOSIS — M5416 Radiculopathy, lumbar region: Secondary | ICD-10-CM | POA: Diagnosis not present

## 2016-10-28 DIAGNOSIS — M159 Polyosteoarthritis, unspecified: Secondary | ICD-10-CM | POA: Diagnosis not present

## 2016-10-28 DIAGNOSIS — J449 Chronic obstructive pulmonary disease, unspecified: Secondary | ICD-10-CM | POA: Diagnosis not present

## 2016-10-28 DIAGNOSIS — E782 Mixed hyperlipidemia: Secondary | ICD-10-CM | POA: Diagnosis not present

## 2016-10-28 DIAGNOSIS — I10 Essential (primary) hypertension: Secondary | ICD-10-CM | POA: Diagnosis not present

## 2016-11-10 DIAGNOSIS — M48061 Spinal stenosis, lumbar region without neurogenic claudication: Secondary | ICD-10-CM | POA: Diagnosis not present

## 2016-11-10 DIAGNOSIS — M5416 Radiculopathy, lumbar region: Secondary | ICD-10-CM | POA: Diagnosis not present

## 2016-11-17 ENCOUNTER — Encounter (HOSPITAL_BASED_OUTPATIENT_CLINIC_OR_DEPARTMENT_OTHER): Payer: Self-pay | Admitting: *Deleted

## 2016-11-17 ENCOUNTER — Emergency Department (HOSPITAL_BASED_OUTPATIENT_CLINIC_OR_DEPARTMENT_OTHER): Payer: PPO

## 2016-11-17 ENCOUNTER — Emergency Department (HOSPITAL_BASED_OUTPATIENT_CLINIC_OR_DEPARTMENT_OTHER)
Admission: EM | Admit: 2016-11-17 | Discharge: 2016-11-17 | Disposition: A | Discharge status: Home / Self Care | Payer: PPO | Attending: Emergency Medicine | Admitting: Emergency Medicine

## 2016-11-17 DIAGNOSIS — Z87891 Personal history of nicotine dependence: Secondary | ICD-10-CM | POA: Insufficient documentation

## 2016-11-17 DIAGNOSIS — Z79899 Other long term (current) drug therapy: Secondary | ICD-10-CM | POA: Diagnosis not present

## 2016-11-17 DIAGNOSIS — I10 Essential (primary) hypertension: Secondary | ICD-10-CM | POA: Diagnosis not present

## 2016-11-17 DIAGNOSIS — M545 Low back pain: Secondary | ICD-10-CM | POA: Diagnosis not present

## 2016-11-17 DIAGNOSIS — R1032 Left lower quadrant pain: Secondary | ICD-10-CM | POA: Diagnosis present

## 2016-11-17 DIAGNOSIS — M5136 Other intervertebral disc degeneration, lumbar region: Secondary | ICD-10-CM | POA: Diagnosis not present

## 2016-11-17 DIAGNOSIS — M5137 Other intervertebral disc degeneration, lumbosacral region: Secondary | ICD-10-CM

## 2016-11-17 MED ORDER — KETOROLAC TROMETHAMINE 30 MG/ML IJ SOLN
30.0000 mg | Freq: Once | INTRAMUSCULAR | Status: AC
  Administered 2016-11-17: 30 mg via INTRAMUSCULAR
  Filled 2016-11-17: qty 1

## 2016-11-17 MED ORDER — PREDNISONE 10 MG (21) PO TBPK: ORAL_TABLET | ORAL | 0 refills | Status: DC

## 2016-11-17 MED ORDER — DEXAMETHASONE SODIUM PHOSPHATE 10 MG/ML IJ SOLN
10.0000 mg | Freq: Once | INTRAMUSCULAR | Status: AC
  Administered 2016-11-17: 10 mg via INTRAMUSCULAR
  Filled 2016-11-17: qty 1
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
## 2016-11-20 ENCOUNTER — Emergency Department (HOSPITAL_BASED_OUTPATIENT_CLINIC_OR_DEPARTMENT_OTHER)
Admission: EM | Admit: 2016-11-20 | Discharge: 2016-11-20 | Disposition: A | Discharge status: Home / Self Care | Payer: PPO | Attending: Emergency Medicine | Admitting: Emergency Medicine

## 2016-11-20 ENCOUNTER — Encounter (HOSPITAL_BASED_OUTPATIENT_CLINIC_OR_DEPARTMENT_OTHER): Payer: Self-pay | Admitting: Emergency Medicine

## 2016-11-20 DIAGNOSIS — I10 Essential (primary) hypertension: Secondary | ICD-10-CM | POA: Diagnosis not present

## 2016-11-20 DIAGNOSIS — M5442 Lumbago with sciatica, left side: Secondary | ICD-10-CM | POA: Insufficient documentation

## 2016-11-20 DIAGNOSIS — R200 Anesthesia of skin: Secondary | ICD-10-CM

## 2016-11-20 DIAGNOSIS — Z79899 Other long term (current) drug therapy: Secondary | ICD-10-CM | POA: Insufficient documentation

## 2016-11-20 DIAGNOSIS — M545 Low back pain: Secondary | ICD-10-CM | POA: Diagnosis present

## 2016-11-20 DIAGNOSIS — R29898 Other symptoms and signs involving the musculoskeletal system: Secondary | ICD-10-CM

## 2016-11-20 DIAGNOSIS — Z87891 Personal history of nicotine dependence: Secondary | ICD-10-CM | POA: Insufficient documentation

## 2016-11-20 DIAGNOSIS — R531 Weakness: Secondary | ICD-10-CM | POA: Insufficient documentation

## 2016-11-20 DIAGNOSIS — R202 Paresthesia of skin: Secondary | ICD-10-CM | POA: Insufficient documentation
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
## 2016-11-20 NOTE — ED Notes (Signed)
ED Provider at bedside. 
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
## 2016-11-21 ENCOUNTER — Emergency Department (HOSPITAL_BASED_OUTPATIENT_CLINIC_OR_DEPARTMENT_OTHER): Payer: PPO

## 2016-11-21 ENCOUNTER — Emergency Department (HOSPITAL_BASED_OUTPATIENT_CLINIC_OR_DEPARTMENT_OTHER)
Admission: EM | Admit: 2016-11-21 | Discharge: 2016-11-21 | Disposition: A | Discharge status: Home / Self Care | Payer: PPO | Attending: Emergency Medicine | Admitting: Emergency Medicine

## 2016-11-21 ENCOUNTER — Encounter (HOSPITAL_BASED_OUTPATIENT_CLINIC_OR_DEPARTMENT_OTHER): Payer: Self-pay | Admitting: Emergency Medicine

## 2016-11-21 DIAGNOSIS — M545 Low back pain: Secondary | ICD-10-CM | POA: Diagnosis present

## 2016-11-21 DIAGNOSIS — Z79899 Other long term (current) drug therapy: Secondary | ICD-10-CM | POA: Insufficient documentation

## 2016-11-21 DIAGNOSIS — I10 Essential (primary) hypertension: Secondary | ICD-10-CM | POA: Insufficient documentation

## 2016-11-21 DIAGNOSIS — M549 Dorsalgia, unspecified: Secondary | ICD-10-CM | POA: Diagnosis not present

## 2016-11-21 DIAGNOSIS — S299XXA Unspecified injury of thorax, initial encounter: Secondary | ICD-10-CM | POA: Diagnosis not present

## 2016-11-21 DIAGNOSIS — Z87891 Personal history of nicotine dependence: Secondary | ICD-10-CM | POA: Diagnosis not present

## 2016-11-21 DIAGNOSIS — S3992XA Unspecified injury of lower back, initial encounter: Secondary | ICD-10-CM | POA: Diagnosis not present

## 2016-11-21 DIAGNOSIS — M5432 Sciatica, left side: Secondary | ICD-10-CM | POA: Diagnosis not present

## 2016-11-21 NOTE — ED Notes (Signed)
Awaiting MRI to be transferred to disc before able to D/C home

## 2016-11-21 NOTE — ED Notes (Signed)
TO MRI

## 2016-11-21 NOTE — ED Notes (Signed)
Report given to Sue RN
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
## 2016-11-21 NOTE — ED Notes (Signed)
Returned to room form MRI, declines any refreshments

## 2016-12-07 DIAGNOSIS — M5416 Radiculopathy, lumbar region: Secondary | ICD-10-CM | POA: Diagnosis not present

## 2016-12-07 DIAGNOSIS — I10 Essential (primary) hypertension: Secondary | ICD-10-CM | POA: Diagnosis not present

## 2016-12-07 DIAGNOSIS — M48061 Spinal stenosis, lumbar region without neurogenic claudication: Secondary | ICD-10-CM | POA: Diagnosis not present

## 2016-12-07 DIAGNOSIS — M545 Low back pain: Secondary | ICD-10-CM | POA: Diagnosis not present

## 2017-01-11 DIAGNOSIS — M5416 Radiculopathy, lumbar region: Secondary | ICD-10-CM | POA: Diagnosis not present

## 2017-01-20 DIAGNOSIS — Z85820 Personal history of malignant melanoma of skin: Secondary | ICD-10-CM | POA: Diagnosis not present

## 2017-01-20 DIAGNOSIS — Z08 Encounter for follow-up examination after completed treatment for malignant neoplasm: Secondary | ICD-10-CM | POA: Diagnosis not present

## 2017-01-20 DIAGNOSIS — L570 Actinic keratosis: Secondary | ICD-10-CM | POA: Diagnosis not present

## 2017-02-04 DIAGNOSIS — M5416 Radiculopathy, lumbar region: Secondary | ICD-10-CM | POA: Diagnosis not present

## 2017-02-04 DIAGNOSIS — M48062 Spinal stenosis, lumbar region with neurogenic claudication: Secondary | ICD-10-CM | POA: Diagnosis not present

## 2017-02-04 DIAGNOSIS — I10 Essential (primary) hypertension: Secondary | ICD-10-CM | POA: Diagnosis not present

## 2017-02-04 DIAGNOSIS — Z6829 Body mass index (BMI) 29.0-29.9, adult: Secondary | ICD-10-CM | POA: Diagnosis not present

## 2017-04-27 DIAGNOSIS — E782 Mixed hyperlipidemia: Secondary | ICD-10-CM | POA: Diagnosis not present

## 2017-04-27 DIAGNOSIS — M159 Polyosteoarthritis, unspecified: Secondary | ICD-10-CM | POA: Diagnosis not present

## 2017-04-27 DIAGNOSIS — I10 Essential (primary) hypertension: Secondary | ICD-10-CM | POA: Diagnosis not present

## 2017-04-27 DIAGNOSIS — H04123 Dry eye syndrome of bilateral lacrimal glands: Secondary | ICD-10-CM | POA: Diagnosis not present

## 2017-04-27 DIAGNOSIS — H18413 Arcus senilis, bilateral: Secondary | ICD-10-CM | POA: Diagnosis not present

## 2017-04-27 DIAGNOSIS — R600 Localized edema: Secondary | ICD-10-CM | POA: Diagnosis not present

## 2017-04-27 DIAGNOSIS — Z961 Presence of intraocular lens: Secondary | ICD-10-CM | POA: Diagnosis not present

## 2017-06-08 DIAGNOSIS — J449 Chronic obstructive pulmonary disease, unspecified: Secondary | ICD-10-CM | POA: Diagnosis not present

## 2017-06-08 DIAGNOSIS — M159 Polyosteoarthritis, unspecified: Secondary | ICD-10-CM | POA: Diagnosis not present

## 2017-06-08 DIAGNOSIS — M7541 Impingement syndrome of right shoulder: Secondary | ICD-10-CM | POA: Diagnosis not present

## 2017-06-08 DIAGNOSIS — I10 Essential (primary) hypertension: Secondary | ICD-10-CM | POA: Diagnosis not present

## 2017-09-09 ENCOUNTER — Encounter (HOSPITAL_COMMUNITY): Payer: Self-pay

## 2017-09-09 ENCOUNTER — Emergency Department (HOSPITAL_COMMUNITY)
Admission: EM | Admit: 2017-09-09 | Discharge: 2017-09-09 | Disposition: A | Discharge status: Home / Self Care | Payer: PPO | Attending: Emergency Medicine | Admitting: Emergency Medicine

## 2017-09-09 ENCOUNTER — Emergency Department (HOSPITAL_COMMUNITY): Payer: PPO

## 2017-09-09 ENCOUNTER — Other Ambulatory Visit: Payer: Self-pay

## 2017-09-09 ENCOUNTER — Encounter (HOSPITAL_COMMUNITY): Payer: Self-pay | Admitting: Emergency Medicine

## 2017-09-09 ENCOUNTER — Ambulatory Visit (HOSPITAL_COMMUNITY)
Admission: EM | Admit: 2017-09-09 | Discharge: 2017-09-09 | Disposition: A | Discharge status: Home / Self Care | Payer: PPO | Source: Home / Self Care

## 2017-09-09 DIAGNOSIS — R0789 Other chest pain: Secondary | ICD-10-CM | POA: Diagnosis not present

## 2017-09-09 DIAGNOSIS — R002 Palpitations: Secondary | ICD-10-CM

## 2017-09-09 DIAGNOSIS — Z5321 Procedure and treatment not carried out due to patient leaving prior to being seen by health care provider: Secondary | ICD-10-CM | POA: Diagnosis not present

## 2017-09-09 DIAGNOSIS — R079 Chest pain, unspecified: Secondary | ICD-10-CM | POA: Insufficient documentation

## 2017-09-09 LAB — CBC
HCT: 44.4 % (ref 39.0–52.0)
Hemoglobin: 14.8 g/dL (ref 13.0–17.0)
MCH: 28.5 pg (ref 26.0–34.0)
MCHC: 33.3 g/dL (ref 30.0–36.0)
MCV: 85.4 fL (ref 78.0–100.0)
PLATELETS: 231 10*3/uL (ref 150–400)
RBC: 5.2 MIL/uL (ref 4.22–5.81)
RDW: 14.5 % (ref 11.5–15.5)
WBC: 6.5 10*3/uL (ref 4.0–10.5)

## 2017-09-09 LAB — BASIC METABOLIC PANEL
Anion gap: 9 (ref 5–15)
BUN: 9 mg/dL (ref 8–23)
CHLORIDE: 108 mmol/L (ref 98–111)
CO2: 25 mmol/L (ref 22–32)
CREATININE: 1.37 mg/dL — AB (ref 0.61–1.24)
Calcium: 9.5 mg/dL (ref 8.9–10.3)
GFR, EST AFRICAN AMERICAN: 54 mL/min — AB (ref >60)
GFR, EST NON AFRICAN AMERICAN: 46 mL/min — AB (ref >60)
Glucose, Bld: 102 mg/dL — ABNORMAL HIGH (ref 70–99)
Potassium: 4.1 mmol/L (ref 3.5–5.1)
SODIUM: 142 mmol/L (ref 135–145)

## 2017-09-09 LAB — I-STAT TROPONIN, ED: Troponin i, poc: 0.01 ng/mL (ref 0.00–0.08)

## 2017-09-09 NOTE — ED Notes (Signed)
Pt to go to ED for further eval per YN

## 2017-09-09 NOTE — ED Triage Notes (Signed)
Pt had chest tightness yesterday; pt sts some recent episodes of palpitations

## 2017-09-09 NOTE — ED Triage Notes (Signed)
Pt states that he has been having tightness in his chest since last night, denies cardiac history. Went to UC today and was sent here to have blood work.

## 2017-09-09 NOTE — ED Notes (Signed)
Pt requested to know how long his wait would be. Tech informed the patient of the current longest wait time He then proceeded to ask if he could get his blood test results.

## 2017-09-10 NOTE — ED Notes (Signed)
Follow up  call made  Will follow up w pcp  09/10/17    1310  s Vincentina Sollers rn

## 2017-10-05 DIAGNOSIS — E876 Hypokalemia: Secondary | ICD-10-CM | POA: Diagnosis not present

## 2017-10-05 DIAGNOSIS — Z6833 Body mass index (BMI) 33.0-33.9, adult: Secondary | ICD-10-CM | POA: Diagnosis not present

## 2017-10-05 DIAGNOSIS — E782 Mixed hyperlipidemia: Secondary | ICD-10-CM | POA: Diagnosis not present

## 2017-10-05 DIAGNOSIS — N401 Enlarged prostate with lower urinary tract symptoms: Secondary | ICD-10-CM | POA: Diagnosis not present

## 2017-10-05 DIAGNOSIS — I10 Essential (primary) hypertension: Secondary | ICD-10-CM | POA: Diagnosis not present

## 2017-10-05 DIAGNOSIS — R7303 Prediabetes: Secondary | ICD-10-CM | POA: Diagnosis not present

## 2017-10-05 DIAGNOSIS — R600 Localized edema: Secondary | ICD-10-CM | POA: Diagnosis not present

## 2017-10-05 DIAGNOSIS — E669 Obesity, unspecified: Secondary | ICD-10-CM | POA: Diagnosis not present

## 2017-10-14 DIAGNOSIS — R7303 Prediabetes: Secondary | ICD-10-CM | POA: Diagnosis not present

## 2018-01-12 DIAGNOSIS — Z7184 Encounter for health counseling related to travel: Secondary | ICD-10-CM | POA: Diagnosis not present

## 2018-01-12 DIAGNOSIS — N401 Enlarged prostate with lower urinary tract symptoms: Secondary | ICD-10-CM | POA: Diagnosis not present

## 2018-01-12 DIAGNOSIS — I10 Essential (primary) hypertension: Secondary | ICD-10-CM | POA: Diagnosis not present

## 2018-01-12 DIAGNOSIS — N3941 Urge incontinence: Secondary | ICD-10-CM | POA: Diagnosis not present

## 2018-01-13 DIAGNOSIS — D239 Other benign neoplasm of skin, unspecified: Secondary | ICD-10-CM | POA: Diagnosis not present

## 2018-01-13 DIAGNOSIS — D1801 Hemangioma of skin and subcutaneous tissue: Secondary | ICD-10-CM | POA: Diagnosis not present

## 2018-01-13 DIAGNOSIS — Z85820 Personal history of malignant melanoma of skin: Secondary | ICD-10-CM | POA: Diagnosis not present

## 2018-01-13 DIAGNOSIS — Z08 Encounter for follow-up examination after completed treatment for malignant neoplasm: Secondary | ICD-10-CM | POA: Diagnosis not present

## 2018-01-13 DIAGNOSIS — L821 Other seborrheic keratosis: Secondary | ICD-10-CM | POA: Diagnosis not present

## 2018-01-26 DIAGNOSIS — N3942 Incontinence without sensory awareness: Secondary | ICD-10-CM | POA: Diagnosis not present

## 2018-01-26 DIAGNOSIS — I10 Essential (primary) hypertension: Secondary | ICD-10-CM | POA: Diagnosis not present

## 2018-01-26 DIAGNOSIS — Z7184 Encounter for health counseling related to travel: Secondary | ICD-10-CM | POA: Diagnosis not present

## 2018-01-26 DIAGNOSIS — R972 Elevated prostate specific antigen [PSA]: Secondary | ICD-10-CM | POA: Diagnosis not present

## 2018-02-15 DIAGNOSIS — I150 Renovascular hypertension: Secondary | ICD-10-CM | POA: Diagnosis not present

## 2018-02-22 DIAGNOSIS — Z86010 Personal history of colonic polyps: Secondary | ICD-10-CM | POA: Diagnosis not present

## 2018-02-22 DIAGNOSIS — Z85038 Personal history of other malignant neoplasm of large intestine: Secondary | ICD-10-CM | POA: Diagnosis not present

## 2018-02-22 DIAGNOSIS — D126 Benign neoplasm of colon, unspecified: Secondary | ICD-10-CM | POA: Diagnosis not present

## 2018-02-22 DIAGNOSIS — Z9049 Acquired absence of other specified parts of digestive tract: Secondary | ICD-10-CM | POA: Diagnosis not present

## 2018-02-22 DIAGNOSIS — K5730 Diverticulosis of large intestine without perforation or abscess without bleeding: Secondary | ICD-10-CM | POA: Diagnosis not present

## 2018-02-22 DIAGNOSIS — K621 Rectal polyp: Secondary | ICD-10-CM | POA: Diagnosis not present

## 2018-02-22 DIAGNOSIS — K6389 Other specified diseases of intestine: Secondary | ICD-10-CM | POA: Diagnosis not present

## 2018-02-22 DIAGNOSIS — Z1211 Encounter for screening for malignant neoplasm of colon: Secondary | ICD-10-CM | POA: Diagnosis not present

## 2018-04-20 DIAGNOSIS — M48061 Spinal stenosis, lumbar region without neurogenic claudication: Secondary | ICD-10-CM | POA: Diagnosis not present

## 2018-04-20 DIAGNOSIS — N3941 Urge incontinence: Secondary | ICD-10-CM | POA: Diagnosis not present

## 2018-04-27 DIAGNOSIS — M5380 Other specified dorsopathies, site unspecified: Secondary | ICD-10-CM | POA: Diagnosis not present

## 2018-04-27 DIAGNOSIS — Z7409 Other reduced mobility: Secondary | ICD-10-CM | POA: Diagnosis not present

## 2018-04-27 DIAGNOSIS — M48061 Spinal stenosis, lumbar region without neurogenic claudication: Secondary | ICD-10-CM | POA: Diagnosis not present

## 2018-05-05 DIAGNOSIS — H18413 Arcus senilis, bilateral: Secondary | ICD-10-CM | POA: Diagnosis not present

## 2018-05-05 DIAGNOSIS — H43811 Vitreous degeneration, right eye: Secondary | ICD-10-CM | POA: Diagnosis not present

## 2018-05-05 DIAGNOSIS — H04123 Dry eye syndrome of bilateral lacrimal glands: Secondary | ICD-10-CM | POA: Diagnosis not present

## 2018-05-05 DIAGNOSIS — Z961 Presence of intraocular lens: Secondary | ICD-10-CM | POA: Diagnosis not present

## 2018-05-12 DIAGNOSIS — J069 Acute upper respiratory infection, unspecified: Secondary | ICD-10-CM | POA: Diagnosis not present

## 2018-07-21 DIAGNOSIS — Z08 Encounter for follow-up examination after completed treatment for malignant neoplasm: Secondary | ICD-10-CM | POA: Diagnosis not present

## 2018-07-21 DIAGNOSIS — Z85820 Personal history of malignant melanoma of skin: Secondary | ICD-10-CM | POA: Diagnosis not present

## 2018-07-21 DIAGNOSIS — D239 Other benign neoplasm of skin, unspecified: Secondary | ICD-10-CM | POA: Diagnosis not present

## 2018-07-21 DIAGNOSIS — L821 Other seborrheic keratosis: Secondary | ICD-10-CM | POA: Diagnosis not present

## 2018-07-21 DIAGNOSIS — D692 Other nonthrombocytopenic purpura: Secondary | ICD-10-CM | POA: Diagnosis not present

## 2018-09-01 DIAGNOSIS — E876 Hypokalemia: Secondary | ICD-10-CM | POA: Diagnosis not present

## 2018-09-01 DIAGNOSIS — N3941 Urge incontinence: Secondary | ICD-10-CM | POA: Diagnosis not present

## 2018-09-01 DIAGNOSIS — R4189 Other symptoms and signs involving cognitive functions and awareness: Secondary | ICD-10-CM | POA: Diagnosis not present

## 2018-09-01 DIAGNOSIS — I150 Renovascular hypertension: Secondary | ICD-10-CM | POA: Diagnosis not present

## 2018-09-01 DIAGNOSIS — M5416 Radiculopathy, lumbar region: Secondary | ICD-10-CM | POA: Diagnosis not present

## 2018-09-20 DIAGNOSIS — M5416 Radiculopathy, lumbar region: Secondary | ICD-10-CM | POA: Diagnosis not present

## 2018-09-20 DIAGNOSIS — Z6828 Body mass index (BMI) 28.0-28.9, adult: Secondary | ICD-10-CM | POA: Diagnosis not present

## 2018-09-20 DIAGNOSIS — I10 Essential (primary) hypertension: Secondary | ICD-10-CM | POA: Diagnosis not present

## 2018-09-20 DIAGNOSIS — M48061 Spinal stenosis, lumbar region without neurogenic claudication: Secondary | ICD-10-CM | POA: Diagnosis not present

## 2018-09-29 DIAGNOSIS — N289 Disorder of kidney and ureter, unspecified: Secondary | ICD-10-CM | POA: Diagnosis not present

## 2018-10-24 DIAGNOSIS — M5416 Radiculopathy, lumbar region: Secondary | ICD-10-CM | POA: Diagnosis not present

## 2018-11-02 DIAGNOSIS — Z87891 Personal history of nicotine dependence: Secondary | ICD-10-CM | POA: Diagnosis not present

## 2018-11-02 DIAGNOSIS — Z2821 Immunization not carried out because of patient refusal: Secondary | ICD-10-CM | POA: Diagnosis not present

## 2018-11-02 DIAGNOSIS — N289 Disorder of kidney and ureter, unspecified: Secondary | ICD-10-CM | POA: Diagnosis not present

## 2018-11-02 DIAGNOSIS — R7303 Prediabetes: Secondary | ICD-10-CM | POA: Diagnosis not present

## 2018-11-02 DIAGNOSIS — J449 Chronic obstructive pulmonary disease, unspecified: Secondary | ICD-10-CM | POA: Diagnosis not present

## 2018-11-02 DIAGNOSIS — I10 Essential (primary) hypertension: Secondary | ICD-10-CM | POA: Diagnosis not present

## 2018-11-02 DIAGNOSIS — E7800 Pure hypercholesterolemia, unspecified: Secondary | ICD-10-CM | POA: Diagnosis not present

## 2018-12-08 DIAGNOSIS — I10 Essential (primary) hypertension: Secondary | ICD-10-CM | POA: Diagnosis not present

## 2019-01-24 DIAGNOSIS — I10 Essential (primary) hypertension: Secondary | ICD-10-CM | POA: Diagnosis not present

## 2019-05-09 DIAGNOSIS — I10 Essential (primary) hypertension: Secondary | ICD-10-CM | POA: Diagnosis not present

## 2019-05-23 DIAGNOSIS — Z961 Presence of intraocular lens: Secondary | ICD-10-CM | POA: Diagnosis not present

## 2019-05-23 DIAGNOSIS — H43811 Vitreous degeneration, right eye: Secondary | ICD-10-CM | POA: Diagnosis not present

## 2019-05-23 DIAGNOSIS — H18413 Arcus senilis, bilateral: Secondary | ICD-10-CM | POA: Diagnosis not present

## 2019-05-23 DIAGNOSIS — H04123 Dry eye syndrome of bilateral lacrimal glands: Secondary | ICD-10-CM | POA: Diagnosis not present

## 2019-05-24 DIAGNOSIS — D485 Neoplasm of uncertain behavior of skin: Secondary | ICD-10-CM | POA: Diagnosis not present

## 2019-05-24 DIAGNOSIS — D692 Other nonthrombocytopenic purpura: Secondary | ICD-10-CM | POA: Diagnosis not present

## 2019-05-24 DIAGNOSIS — L820 Inflamed seborrheic keratosis: Secondary | ICD-10-CM | POA: Diagnosis not present

## 2019-05-24 DIAGNOSIS — L821 Other seborrheic keratosis: Secondary | ICD-10-CM | POA: Diagnosis not present

## 2019-06-06 DIAGNOSIS — L821 Other seborrheic keratosis: Secondary | ICD-10-CM | POA: Diagnosis not present

## 2019-06-06 DIAGNOSIS — I10 Essential (primary) hypertension: Secondary | ICD-10-CM | POA: Diagnosis not present

## 2019-07-27 DIAGNOSIS — L700 Acne vulgaris: Secondary | ICD-10-CM | POA: Diagnosis not present

## 2019-07-27 DIAGNOSIS — D692 Other nonthrombocytopenic purpura: Secondary | ICD-10-CM | POA: Diagnosis not present

## 2019-07-27 DIAGNOSIS — D239 Other benign neoplasm of skin, unspecified: Secondary | ICD-10-CM | POA: Diagnosis not present

## 2019-07-27 DIAGNOSIS — L821 Other seborrheic keratosis: Secondary | ICD-10-CM | POA: Diagnosis not present

## 2019-07-27 DIAGNOSIS — D1801 Hemangioma of skin and subcutaneous tissue: Secondary | ICD-10-CM | POA: Diagnosis not present
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
## 2019-09-05 DIAGNOSIS — J410 Simple chronic bronchitis: Secondary | ICD-10-CM | POA: Diagnosis not present

## 2019-09-05 DIAGNOSIS — M5136 Other intervertebral disc degeneration, lumbar region: Secondary | ICD-10-CM | POA: Diagnosis not present

## 2019-09-05 DIAGNOSIS — N182 Chronic kidney disease, stage 2 (mild): Secondary | ICD-10-CM | POA: Diagnosis not present

## 2019-09-05 DIAGNOSIS — M545 Low back pain: Secondary | ICD-10-CM | POA: Diagnosis not present

## 2019-09-05 DIAGNOSIS — W19XXXA Unspecified fall, initial encounter: Secondary | ICD-10-CM | POA: Diagnosis not present

## 2019-09-05 DIAGNOSIS — I700 Atherosclerosis of aorta: Secondary | ICD-10-CM | POA: Diagnosis not present

## 2019-09-05 DIAGNOSIS — E7800 Pure hypercholesterolemia, unspecified: Secondary | ICD-10-CM | POA: Diagnosis not present

## 2019-09-05 DIAGNOSIS — I129 Hypertensive chronic kidney disease with stage 1 through stage 4 chronic kidney disease, or unspecified chronic kidney disease: Secondary | ICD-10-CM | POA: Diagnosis not present

## 2019-09-12 DIAGNOSIS — I10 Essential (primary) hypertension: Secondary | ICD-10-CM | POA: Diagnosis not present

## 2019-10-23 DIAGNOSIS — I6789 Other cerebrovascular disease: Secondary | ICD-10-CM | POA: Diagnosis not present

## 2019-10-23 DIAGNOSIS — R519 Headache, unspecified: Secondary | ICD-10-CM | POA: Diagnosis not present

## 2019-11-08 DIAGNOSIS — G500 Trigeminal neuralgia: Secondary | ICD-10-CM | POA: Diagnosis not present

## 2020-01-22 DIAGNOSIS — N289 Disorder of kidney and ureter, unspecified: Secondary | ICD-10-CM | POA: Diagnosis not present

## 2020-01-22 DIAGNOSIS — R4189 Other symptoms and signs involving cognitive functions and awareness: Secondary | ICD-10-CM | POA: Diagnosis not present

## 2020-01-22 DIAGNOSIS — I700 Atherosclerosis of aorta: Secondary | ICD-10-CM | POA: Diagnosis not present

## 2020-01-22 DIAGNOSIS — E876 Hypokalemia: Secondary | ICD-10-CM | POA: Diagnosis not present

## 2020-01-22 DIAGNOSIS — Z0000 Encounter for general adult medical examination without abnormal findings: Secondary | ICD-10-CM | POA: Diagnosis not present

## 2020-01-22 DIAGNOSIS — R7303 Prediabetes: Secondary | ICD-10-CM | POA: Diagnosis not present

## 2020-01-22 DIAGNOSIS — N3941 Urge incontinence: Secondary | ICD-10-CM | POA: Diagnosis not present

## 2020-01-22 DIAGNOSIS — C189 Malignant neoplasm of colon, unspecified: Secondary | ICD-10-CM | POA: Diagnosis not present

## 2020-01-22 DIAGNOSIS — C439 Malignant melanoma of skin, unspecified: Secondary | ICD-10-CM | POA: Diagnosis not present

## 2020-01-22 DIAGNOSIS — I129 Hypertensive chronic kidney disease with stage 1 through stage 4 chronic kidney disease, or unspecified chronic kidney disease: Secondary | ICD-10-CM | POA: Diagnosis not present

## 2020-01-22 DIAGNOSIS — G500 Trigeminal neuralgia: Secondary | ICD-10-CM | POA: Diagnosis not present

## 2020-01-22 DIAGNOSIS — E7800 Pure hypercholesterolemia, unspecified: Secondary | ICD-10-CM | POA: Diagnosis not present

## 2020-01-22 DIAGNOSIS — N182 Chronic kidney disease, stage 2 (mild): Secondary | ICD-10-CM | POA: Diagnosis not present

## 2020-01-23 DIAGNOSIS — N182 Chronic kidney disease, stage 2 (mild): Secondary | ICD-10-CM | POA: Diagnosis not present

## 2020-01-23 DIAGNOSIS — R7303 Prediabetes: Secondary | ICD-10-CM | POA: Diagnosis not present

## 2020-01-23 DIAGNOSIS — I10 Essential (primary) hypertension: Secondary | ICD-10-CM | POA: Diagnosis not present

## 2020-01-23 DIAGNOSIS — Z125 Encounter for screening for malignant neoplasm of prostate: Secondary | ICD-10-CM | POA: Diagnosis not present

## 2020-01-23 DIAGNOSIS — E7800 Pure hypercholesterolemia, unspecified: Secondary | ICD-10-CM | POA: Diagnosis not present

## 2020-01-25 DIAGNOSIS — N182 Chronic kidney disease, stage 2 (mild): Secondary | ICD-10-CM | POA: Diagnosis not present

## 2020-01-25 DIAGNOSIS — E7800 Pure hypercholesterolemia, unspecified: Secondary | ICD-10-CM | POA: Diagnosis not present

## 2020-01-25 DIAGNOSIS — R1909 Other intra-abdominal and pelvic swelling, mass and lump: Secondary | ICD-10-CM | POA: Diagnosis not present

## 2020-01-25 DIAGNOSIS — N281 Cyst of kidney, acquired: Secondary | ICD-10-CM | POA: Diagnosis not present

## 2020-01-25 DIAGNOSIS — I129 Hypertensive chronic kidney disease with stage 1 through stage 4 chronic kidney disease, or unspecified chronic kidney disease: Secondary | ICD-10-CM | POA: Diagnosis not present

## 2020-01-25 DIAGNOSIS — R7303 Prediabetes: Secondary | ICD-10-CM | POA: Diagnosis not present

## 2020-03-22 DIAGNOSIS — I083 Combined rheumatic disorders of mitral, aortic and tricuspid valves: Secondary | ICD-10-CM | POA: Diagnosis not present

## 2020-03-22 DIAGNOSIS — I272 Pulmonary hypertension, unspecified: Secondary | ICD-10-CM | POA: Diagnosis not present

## 2020-03-22 DIAGNOSIS — I6523 Occlusion and stenosis of bilateral carotid arteries: Secondary | ICD-10-CM | POA: Diagnosis not present

## 2020-04-10 DIAGNOSIS — J3489 Other specified disorders of nose and nasal sinuses: Secondary | ICD-10-CM | POA: Diagnosis not present

## 2020-04-10 DIAGNOSIS — R519 Headache, unspecified: Secondary | ICD-10-CM | POA: Diagnosis not present

## 2020-04-10 DIAGNOSIS — J321 Chronic frontal sinusitis: Secondary | ICD-10-CM | POA: Diagnosis not present

## 2020-05-30 DIAGNOSIS — J410 Simple chronic bronchitis: Secondary | ICD-10-CM | POA: Diagnosis not present

## 2020-05-30 DIAGNOSIS — C189 Malignant neoplasm of colon, unspecified: Secondary | ICD-10-CM | POA: Diagnosis not present

## 2020-05-30 DIAGNOSIS — I129 Hypertensive chronic kidney disease with stage 1 through stage 4 chronic kidney disease, or unspecified chronic kidney disease: Secondary | ICD-10-CM | POA: Diagnosis not present

## 2020-05-30 DIAGNOSIS — E7800 Pure hypercholesterolemia, unspecified: Secondary | ICD-10-CM | POA: Diagnosis not present

## 2020-05-30 DIAGNOSIS — N289 Disorder of kidney and ureter, unspecified: Secondary | ICD-10-CM | POA: Diagnosis not present

## 2020-05-30 DIAGNOSIS — E876 Hypokalemia: Secondary | ICD-10-CM | POA: Diagnosis not present

## 2020-05-30 DIAGNOSIS — N1831 Chronic kidney disease, stage 3a: Secondary | ICD-10-CM | POA: Diagnosis not present

## 2020-05-30 DIAGNOSIS — M48061 Spinal stenosis, lumbar region without neurogenic claudication: Secondary | ICD-10-CM | POA: Diagnosis not present

## 2020-05-30 DIAGNOSIS — R4189 Other symptoms and signs involving cognitive functions and awareness: Secondary | ICD-10-CM | POA: Diagnosis not present

## 2020-05-30 DIAGNOSIS — I700 Atherosclerosis of aorta: Secondary | ICD-10-CM | POA: Diagnosis not present

## 2020-05-30 DIAGNOSIS — C439 Malignant melanoma of skin, unspecified: Secondary | ICD-10-CM | POA: Diagnosis not present

## 2020-05-30 DIAGNOSIS — R7303 Prediabetes: Secondary | ICD-10-CM | POA: Diagnosis not present

## 2020-05-31 DIAGNOSIS — Z961 Presence of intraocular lens: Secondary | ICD-10-CM | POA: Diagnosis not present

## 2020-05-31 DIAGNOSIS — H18413 Arcus senilis, bilateral: Secondary | ICD-10-CM | POA: Diagnosis not present

## 2020-05-31 DIAGNOSIS — H43811 Vitreous degeneration, right eye: Secondary | ICD-10-CM | POA: Diagnosis not present

## 2020-05-31 DIAGNOSIS — H04123 Dry eye syndrome of bilateral lacrimal glands: Secondary | ICD-10-CM | POA: Diagnosis not present

## 2020-06-13 DIAGNOSIS — G500 Trigeminal neuralgia: Secondary | ICD-10-CM | POA: Diagnosis not present

## 2020-07-19 DIAGNOSIS — N410 Acute prostatitis: Secondary | ICD-10-CM | POA: Diagnosis not present

## 2020-07-25 DIAGNOSIS — Z85820 Personal history of malignant melanoma of skin: Secondary | ICD-10-CM | POA: Diagnosis not present

## 2020-07-25 DIAGNOSIS — D1801 Hemangioma of skin and subcutaneous tissue: Secondary | ICD-10-CM | POA: Diagnosis not present

## 2020-07-25 DIAGNOSIS — D239 Other benign neoplasm of skin, unspecified: Secondary | ICD-10-CM | POA: Diagnosis not present

## 2020-07-25 DIAGNOSIS — L821 Other seborrheic keratosis: Secondary | ICD-10-CM | POA: Diagnosis not present

## 2020-08-20 DIAGNOSIS — M5431 Sciatica, right side: Secondary | ICD-10-CM | POA: Diagnosis not present

## 2020-08-20 DIAGNOSIS — M5432 Sciatica, left side: Secondary | ICD-10-CM | POA: Diagnosis not present

## 2020-08-20 DIAGNOSIS — M4726 Other spondylosis with radiculopathy, lumbar region: Secondary | ICD-10-CM | POA: Diagnosis not present

## 2020-08-20 DIAGNOSIS — M48062 Spinal stenosis, lumbar region with neurogenic claudication: Secondary | ICD-10-CM | POA: Diagnosis not present

## 2020-08-20 DIAGNOSIS — M545 Low back pain, unspecified: Secondary | ICD-10-CM | POA: Diagnosis not present

## 2020-09-11 DIAGNOSIS — M5116 Intervertebral disc disorders with radiculopathy, lumbar region: Secondary | ICD-10-CM | POA: Diagnosis not present

## 2020-09-11 DIAGNOSIS — M4316 Spondylolisthesis, lumbar region: Secondary | ICD-10-CM | POA: Diagnosis not present

## 2020-09-11 DIAGNOSIS — M4726 Other spondylosis with radiculopathy, lumbar region: Secondary | ICD-10-CM | POA: Diagnosis not present

## 2020-09-11 DIAGNOSIS — M48061 Spinal stenosis, lumbar region without neurogenic claudication: Secondary | ICD-10-CM | POA: Diagnosis not present

## 2020-09-17 DIAGNOSIS — M5432 Sciatica, left side: Secondary | ICD-10-CM | POA: Diagnosis not present

## 2020-09-17 DIAGNOSIS — M5431 Sciatica, right side: Secondary | ICD-10-CM | POA: Diagnosis not present

## 2020-09-17 DIAGNOSIS — M4726 Other spondylosis with radiculopathy, lumbar region: Secondary | ICD-10-CM | POA: Diagnosis not present

## 2020-09-17 DIAGNOSIS — M48062 Spinal stenosis, lumbar region with neurogenic claudication: Secondary | ICD-10-CM | POA: Diagnosis not present

## 2020-09-20 DIAGNOSIS — U071 COVID-19: Secondary | ICD-10-CM | POA: Diagnosis not present

## 2020-10-25 DIAGNOSIS — Z20822 Contact with and (suspected) exposure to covid-19: Secondary | ICD-10-CM | POA: Diagnosis not present

## 2020-11-15 DIAGNOSIS — I272 Pulmonary hypertension, unspecified: Secondary | ICD-10-CM | POA: Diagnosis not present

## 2020-11-15 DIAGNOSIS — M48061 Spinal stenosis, lumbar region without neurogenic claudication: Secondary | ICD-10-CM | POA: Diagnosis not present

## 2020-11-15 DIAGNOSIS — M47816 Spondylosis without myelopathy or radiculopathy, lumbar region: Secondary | ICD-10-CM | POA: Diagnosis not present

## 2020-11-15 DIAGNOSIS — D649 Anemia, unspecified: Secondary | ICD-10-CM | POA: Diagnosis not present

## 2020-11-15 DIAGNOSIS — I129 Hypertensive chronic kidney disease with stage 1 through stage 4 chronic kidney disease, or unspecified chronic kidney disease: Secondary | ICD-10-CM | POA: Diagnosis not present

## 2020-11-15 DIAGNOSIS — N401 Enlarged prostate with lower urinary tract symptoms: Secondary | ICD-10-CM | POA: Diagnosis not present

## 2020-11-15 DIAGNOSIS — E876 Hypokalemia: Secondary | ICD-10-CM | POA: Diagnosis not present

## 2020-11-15 DIAGNOSIS — N1831 Chronic kidney disease, stage 3a: Secondary | ICD-10-CM | POA: Diagnosis not present

## 2020-11-15 DIAGNOSIS — M5136 Other intervertebral disc degeneration, lumbar region: Secondary | ICD-10-CM | POA: Diagnosis not present

## 2020-11-15 DIAGNOSIS — I6521 Occlusion and stenosis of right carotid artery: Secondary | ICD-10-CM | POA: Diagnosis not present

## 2020-11-15 DIAGNOSIS — E785 Hyperlipidemia, unspecified: Secondary | ICD-10-CM | POA: Diagnosis not present

## 2020-11-15 DIAGNOSIS — Z2821 Immunization not carried out because of patient refusal: Secondary | ICD-10-CM | POA: Diagnosis not present

## 2020-11-16 DIAGNOSIS — Z136 Encounter for screening for cardiovascular disorders: Secondary | ICD-10-CM | POA: Diagnosis not present

## 2020-11-16 DIAGNOSIS — D649 Anemia, unspecified: Secondary | ICD-10-CM | POA: Diagnosis not present

## 2020-11-16 DIAGNOSIS — Z125 Encounter for screening for malignant neoplasm of prostate: Secondary | ICD-10-CM | POA: Diagnosis not present

## 2020-11-16 DIAGNOSIS — R7989 Other specified abnormal findings of blood chemistry: Secondary | ICD-10-CM | POA: Diagnosis not present

## 2020-11-16 DIAGNOSIS — Z803 Family history of malignant neoplasm of breast: Secondary | ICD-10-CM | POA: Diagnosis not present

## 2020-12-04 DIAGNOSIS — G894 Chronic pain syndrome: Secondary | ICD-10-CM | POA: Diagnosis not present

## 2020-12-04 DIAGNOSIS — M5136 Other intervertebral disc degeneration, lumbar region: Secondary | ICD-10-CM | POA: Diagnosis not present

## 2020-12-04 DIAGNOSIS — M47816 Spondylosis without myelopathy or radiculopathy, lumbar region: Secondary | ICD-10-CM | POA: Diagnosis not present

## 2020-12-04 DIAGNOSIS — M48061 Spinal stenosis, lumbar region without neurogenic claudication: Secondary | ICD-10-CM | POA: Diagnosis not present

## 2020-12-16 DIAGNOSIS — D649 Anemia, unspecified: Secondary | ICD-10-CM | POA: Diagnosis not present

## 2020-12-16 DIAGNOSIS — L570 Actinic keratosis: Secondary | ICD-10-CM | POA: Diagnosis not present

## 2020-12-16 DIAGNOSIS — Z7689 Persons encountering health services in other specified circumstances: Secondary | ICD-10-CM | POA: Diagnosis not present

## 2020-12-16 DIAGNOSIS — R630 Anorexia: Secondary | ICD-10-CM | POA: Diagnosis not present

## 2020-12-16 DIAGNOSIS — R634 Abnormal weight loss: Secondary | ICD-10-CM | POA: Diagnosis not present

## 2020-12-16 DIAGNOSIS — R6889 Other general symptoms and signs: Secondary | ICD-10-CM | POA: Diagnosis not present

## 2021-01-14 DIAGNOSIS — H04123 Dry eye syndrome of bilateral lacrimal glands: Secondary | ICD-10-CM | POA: Diagnosis not present

## 2021-01-14 DIAGNOSIS — H0015 Chalazion left lower eyelid: Secondary | ICD-10-CM | POA: Diagnosis not present

## 2021-01-14 DIAGNOSIS — Z961 Presence of intraocular lens: Secondary | ICD-10-CM | POA: Diagnosis not present

## 2021-02-04 DIAGNOSIS — I10 Essential (primary) hypertension: Secondary | ICD-10-CM | POA: Diagnosis not present

## 2021-02-04 DIAGNOSIS — M48062 Spinal stenosis, lumbar region with neurogenic claudication: Secondary | ICD-10-CM | POA: Diagnosis not present

## 2021-02-04 DIAGNOSIS — M5416 Radiculopathy, lumbar region: Secondary | ICD-10-CM | POA: Diagnosis not present

## 2021-02-04 DIAGNOSIS — Z6828 Body mass index (BMI) 28.0-28.9, adult: Secondary | ICD-10-CM | POA: Diagnosis not present

## 2021-02-09 DIAGNOSIS — R0602 Shortness of breath: Secondary | ICD-10-CM | POA: Diagnosis not present

## 2022-02-26 ENCOUNTER — Inpatient Hospital Stay (HOSPITAL_COMMUNITY)
Admission: EM | Admit: 2022-02-26 | Discharge: 2022-03-10 | DRG: 177 | Disposition: A | Discharge status: Skilled Nursing Facility | Payer: Medicare Other | Attending: Internal Medicine | Admitting: Internal Medicine

## 2022-02-26 ENCOUNTER — Inpatient Hospital Stay (HOSPITAL_COMMUNITY): Payer: Medicare Other

## 2022-02-26 ENCOUNTER — Emergency Department (HOSPITAL_COMMUNITY): Payer: Medicare Other

## 2022-02-26 ENCOUNTER — Other Ambulatory Visit: Payer: Self-pay

## 2022-02-26 ENCOUNTER — Encounter (HOSPITAL_COMMUNITY): Payer: Self-pay

## 2022-02-26 DIAGNOSIS — J9601 Acute respiratory failure with hypoxia: Secondary | ICD-10-CM

## 2022-02-26 DIAGNOSIS — Z1152 Encounter for screening for COVID-19: Secondary | ICD-10-CM | POA: Diagnosis not present

## 2022-02-26 DIAGNOSIS — Z85820 Personal history of malignant melanoma of skin: Secondary | ICD-10-CM

## 2022-02-26 DIAGNOSIS — J918 Pleural effusion in other conditions classified elsewhere: Secondary | ICD-10-CM

## 2022-02-26 DIAGNOSIS — Z79899 Other long term (current) drug therapy: Secondary | ICD-10-CM

## 2022-02-26 DIAGNOSIS — N400 Enlarged prostate without lower urinary tract symptoms: Secondary | ICD-10-CM | POA: Diagnosis present

## 2022-02-26 DIAGNOSIS — Z85038 Personal history of other malignant neoplasm of large intestine: Secondary | ICD-10-CM

## 2022-02-26 DIAGNOSIS — I129 Hypertensive chronic kidney disease with stage 1 through stage 4 chronic kidney disease, or unspecified chronic kidney disease: Secondary | ICD-10-CM | POA: Diagnosis present

## 2022-02-26 DIAGNOSIS — M1990 Unspecified osteoarthritis, unspecified site: Secondary | ICD-10-CM | POA: Diagnosis present

## 2022-02-26 DIAGNOSIS — E43 Unspecified severe protein-calorie malnutrition: Secondary | ICD-10-CM | POA: Diagnosis present

## 2022-02-26 DIAGNOSIS — D649 Anemia, unspecified: Secondary | ICD-10-CM | POA: Insufficient documentation

## 2022-02-26 DIAGNOSIS — Y831 Surgical operation with implant of artificial internal device as the cause of abnormal reaction of the patient, or of later complication, without mention of misadventure at the time of the procedure: Secondary | ICD-10-CM | POA: Diagnosis not present

## 2022-02-26 DIAGNOSIS — F0390 Unspecified dementia without behavioral disturbance: Secondary | ICD-10-CM | POA: Diagnosis present

## 2022-02-26 DIAGNOSIS — F05 Delirium due to known physiological condition: Secondary | ICD-10-CM | POA: Diagnosis present

## 2022-02-26 DIAGNOSIS — J189 Pneumonia, unspecified organism: Secondary | ICD-10-CM

## 2022-02-26 DIAGNOSIS — I10 Essential (primary) hypertension: Secondary | ICD-10-CM

## 2022-02-26 DIAGNOSIS — J869 Pyothorax without fistula: Principal | ICD-10-CM | POA: Insufficient documentation

## 2022-02-26 DIAGNOSIS — Z8249 Family history of ischemic heart disease and other diseases of the circulatory system: Secondary | ICD-10-CM | POA: Diagnosis not present

## 2022-02-26 DIAGNOSIS — J9811 Atelectasis: Secondary | ICD-10-CM | POA: Diagnosis present

## 2022-02-26 DIAGNOSIS — T85628A Displacement of other specified internal prosthetic devices, implants and grafts, initial encounter: Secondary | ICD-10-CM | POA: Diagnosis not present

## 2022-02-26 DIAGNOSIS — G9341 Metabolic encephalopathy: Secondary | ICD-10-CM | POA: Diagnosis present

## 2022-02-26 DIAGNOSIS — I272 Pulmonary hypertension, unspecified: Secondary | ICD-10-CM | POA: Diagnosis present

## 2022-02-26 DIAGNOSIS — Z888 Allergy status to other drugs, medicaments and biological substances status: Secondary | ICD-10-CM

## 2022-02-26 DIAGNOSIS — Z781 Physical restraint status: Secondary | ICD-10-CM

## 2022-02-26 DIAGNOSIS — J13 Pneumonia due to Streptococcus pneumoniae: Secondary | ICD-10-CM | POA: Diagnosis present

## 2022-02-26 DIAGNOSIS — N1831 Chronic kidney disease, stage 3a: Secondary | ICD-10-CM | POA: Insufficient documentation

## 2022-02-26 DIAGNOSIS — Z87442 Personal history of urinary calculi: Secondary | ICD-10-CM

## 2022-02-26 DIAGNOSIS — J90 Pleural effusion, not elsewhere classified: Secondary | ICD-10-CM | POA: Insufficient documentation

## 2022-02-26 DIAGNOSIS — E876 Hypokalemia: Secondary | ICD-10-CM | POA: Diagnosis not present

## 2022-02-26 DIAGNOSIS — Z6822 Body mass index (BMI) 22.0-22.9, adult: Secondary | ICD-10-CM

## 2022-02-26 DIAGNOSIS — Z66 Do not resuscitate: Secondary | ICD-10-CM | POA: Diagnosis not present

## 2022-02-26 DIAGNOSIS — E785 Hyperlipidemia, unspecified: Secondary | ICD-10-CM | POA: Diagnosis present

## 2022-02-26 DIAGNOSIS — Z87891 Personal history of nicotine dependence: Secondary | ICD-10-CM

## 2022-02-26 DIAGNOSIS — Z9049 Acquired absence of other specified parts of digestive tract: Secondary | ICD-10-CM

## 2022-02-26 HISTORY — DX: Anemia, unspecified: D64.9

## 2022-02-26 HISTORY — DX: Pneumonia, unspecified organism: J18.9

## 2022-02-26 HISTORY — DX: Acute respiratory failure with hypoxia: J96.01

## 2022-02-26 HISTORY — DX: Chronic kidney disease, stage 3a: N18.31

## 2022-02-26 HISTORY — DX: Essential (primary) hypertension: I10

## 2022-02-26 HISTORY — DX: Pleural effusion, not elsewhere classified: J90

## 2022-02-26 LAB — COMPREHENSIVE METABOLIC PANEL
ALT: 33 U/L (ref 0–44)
AST: 24 U/L (ref 15–41)
Albumin: 2.9 g/dL — ABNORMAL LOW (ref 3.5–5.0)
Alkaline Phosphatase: 74 U/L (ref 38–126)
Anion gap: 12 (ref 5–15)
BUN: 41 mg/dL — ABNORMAL HIGH (ref 8–23)
CO2: 22 mmol/L (ref 22–32)
Calcium: 9 mg/dL (ref 8.9–10.3)
Chloride: 101 mmol/L (ref 98–111)
Creatinine, Ser: 1.45 mg/dL — ABNORMAL HIGH (ref 0.61–1.24)
GFR, Estimated: 47 mL/min — ABNORMAL LOW (ref >60)
Glucose, Bld: 116 mg/dL — ABNORMAL HIGH (ref 70–99)
Potassium: 3.6 mmol/L (ref 3.5–5.1)
Sodium: 135 mmol/L (ref 135–145)
Total Bilirubin: 0.7 mg/dL (ref 0.3–1.2)
Total Protein: 6.6 g/dL (ref 6.5–8.1)

## 2022-02-26 LAB — URINALYSIS, ROUTINE W REFLEX MICROSCOPIC
Bilirubin Urine: NEGATIVE
Glucose, UA: NEGATIVE mg/dL
Hgb urine dipstick: NEGATIVE
Ketones, ur: NEGATIVE mg/dL
Leukocytes,Ua: NEGATIVE
Nitrite: NEGATIVE
Protein, ur: >=300 mg/dL — AB
RBC / HPF: >50 RBC/hpf — ABNORMAL HIGH (ref 0–5)
Specific Gravity, Urine: 1.024 (ref 1.005–1.030)
WBC, UA: >50 WBC/hpf — ABNORMAL HIGH (ref 0–5)
pH: 7 (ref 5.0–8.0)

## 2022-02-26 LAB — STREP PNEUMONIAE URINARY ANTIGEN: Strep Pneumo Urinary Antigen: NEGATIVE

## 2022-02-26 LAB — LACTIC ACID, PLASMA
Lactic Acid, Venous: 1.7 mmol/L (ref 0.5–1.9)
Lactic Acid, Venous: 1.1 mmol/L (ref 0.5–1.9)

## 2022-02-26 LAB — BODY FLUID CELL COUNT WITH DIFFERENTIAL
Eos, Fluid: 0 %
Lymphs, Fluid: 5 %
Monocyte-Macrophage-Serous Fluid: 3 % — ABNORMAL LOW (ref 50–90)
Neutrophil Count, Fluid: 92 % — ABNORMAL HIGH (ref 0–25)
Total Nucleated Cell Count, Fluid: 2144 cu mm — ABNORMAL HIGH (ref 0–1000)

## 2022-02-26 LAB — CBC WITH DIFFERENTIAL/PLATELET
Abs Immature Granulocytes: 1.88 K/uL — ABNORMAL HIGH (ref 0.00–0.07)
Basophils Absolute: 0.1 K/uL (ref 0.0–0.1)
Basophils Relative: 0 %
Eosinophils Absolute: 0 K/uL (ref 0.0–0.5)
Eosinophils Relative: 0 %
HCT: 34.6 % — ABNORMAL LOW (ref 39.0–52.0)
Hemoglobin: 11.2 g/dL — ABNORMAL LOW (ref 13.0–17.0)
Immature Granulocytes: 4 %
Lymphocytes Relative: 1 %
Lymphs Abs: 0.3 K/uL — ABNORMAL LOW (ref 0.7–4.0)
MCH: 28 pg (ref 26.0–34.0)
MCHC: 32.4 g/dL (ref 30.0–36.0)
MCV: 86.5 fL (ref 80.0–100.0)
Monocytes Absolute: 2.7 K/uL — ABNORMAL HIGH (ref 0.1–1.0)
Monocytes Relative: 6 %
Neutro Abs: 39.5 K/uL — ABNORMAL HIGH (ref 1.7–7.7)
Neutrophils Relative %: 89 %
Platelets: 449 K/uL — ABNORMAL HIGH (ref 150–400)
RBC: 4 MIL/uL — ABNORMAL LOW (ref 4.22–5.81)
RDW: 14.7 % (ref 11.5–15.5)
WBC: 44.5 K/uL — ABNORMAL HIGH (ref 4.0–10.5)
nRBC: 0 % (ref 0.0–0.2)

## 2022-02-26 LAB — RESP PANEL BY RT-PCR (RSV, FLU A&B, COVID)  RVPGX2
Influenza A by PCR: NEGATIVE
Influenza B by PCR: NEGATIVE
Resp Syncytial Virus by PCR: NEGATIVE
SARS Coronavirus 2 by RT PCR: NEGATIVE

## 2022-02-26 LAB — PROTEIN, PLEURAL OR PERITONEAL FLUID: Total protein, fluid: 3.8 g/dL

## 2022-02-26 LAB — LACTATE DEHYDROGENASE: LDH: 97 U/L — ABNORMAL LOW (ref 98–192)

## 2022-02-26 LAB — PROTIME-INR
INR: 1.8 — ABNORMAL HIGH (ref 0.8–1.2)
Prothrombin Time: 20.3 s — ABNORMAL HIGH (ref 11.4–15.2)

## 2022-02-26 LAB — APTT: aPTT: 37 seconds — ABNORMAL HIGH (ref 24–36)

## 2022-02-26 LAB — LACTATE DEHYDROGENASE, PLEURAL OR PERITONEAL FLUID: LD, Fluid: 453 U/L — ABNORMAL HIGH (ref 3–23)

## 2022-02-26 LAB — MRSA NEXT GEN BY PCR, NASAL: MRSA by PCR Next Gen: NOT DETECTED

## 2022-02-26 LAB — CBG MONITORING, ED: Glucose-Capillary: 108 mg/dL — ABNORMAL HIGH (ref 70–99)

## 2022-02-26 MED ORDER — SODIUM CHLORIDE 0.9 % IV SOLN
1.0000 g | Freq: Once | INTRAVENOUS | Status: DC
  Filled 2022-02-26: qty 10

## 2022-02-26 MED ORDER — SODIUM CHLORIDE 0.9% FLUSH
10.0000 mL | Freq: Three times a day (TID) | INTRAVENOUS | Status: DC
  Administered 2022-02-27 – 2022-03-02 (×6): 10 mL via INTRAPLEURAL

## 2022-02-26 MED ORDER — LIDOCAINE HCL (PF) 1 % IJ SOLN
10.0000 mL | Freq: Once | INTRAMUSCULAR | Status: AC
  Administered 2022-02-26: 10 mL via INTRADERMAL
  Filled 2022-02-26: qty 30

## 2022-02-26 MED ORDER — SODIUM CHLORIDE 0.9 % IV SOLN
500.0000 mg | Freq: Once | INTRAVENOUS | Status: DC
  Filled 2022-02-26: qty 5

## 2022-02-26 MED ORDER — SODIUM CHLORIDE 0.9 % IV SOLN: INTRAVENOUS | Status: DC

## 2022-02-26 MED ORDER — VANCOMYCIN HCL IN DEXTROSE 1-5 GM/200ML-% IV SOLN: 1000.0000 mg | Freq: Once | INTRAVENOUS | Status: DC

## 2022-02-26 MED ORDER — SODIUM CHLORIDE 0.9 % IV BOLUS (SEPSIS)
500.0000 mL | Freq: Once | INTRAVENOUS | Status: AC
  Administered 2022-02-26: 500 mL via INTRAVENOUS

## 2022-02-26 MED ORDER — IOHEXOL 350 MG/ML SOLN
100.0000 mL | Freq: Once | INTRAVENOUS | Status: AC | PRN
  Administered 2022-02-26: 100 mL via INTRAVENOUS

## 2022-02-26 MED ORDER — VANCOMYCIN HCL 1500 MG/300ML IV SOLN
1500.0000 mg | Freq: Once | INTRAVENOUS | Status: AC
  Administered 2022-02-26: 1500 mg via INTRAVENOUS
  Filled 2022-02-26: qty 300

## 2022-02-26 MED ORDER — SODIUM CHLORIDE 0.9 % IV SOLN
2.0000 g | Freq: Once | INTRAVENOUS | Status: AC
  Administered 2022-02-26: 2 g via INTRAVENOUS
  Filled 2022-02-26: qty 12.5

## 2022-02-26 NOTE — H&P (Signed)
History and Physical    Patient: David [name]:[id number] DOB: 1934-02-22 DOA: 02/26/2022 DOS: the patient was seen and examined on 02/26/2022 PCP: [name], MD  Patient coming from: Home  Chief Complaint:  Chief Complaint  Patient presents with   Code Sepsis   HPI: David [name] is a 89 y.o. male with medical history significant of CKD3a, HTN, Colon CA s/p right hemicolectomy, malignant melanoma, BPH. Presenting with cough and shortness of breath. He reports that he was recently in the hospital for PNA. After discharge 12/20, he never got better. His cough persisted. He became progressively weaker. He has become more short of breath. His appetite has been poor. He hasn't had any fever, N/V or sick contacts. When his symptoms did not improve this morning, he decided to come to the ED for evaluation. He denies any other aggravating or alleviating factors.   Review of Systems: As mentioned in the history of present illness. All other systems reviewed and are negative. Past Medical History:  Diagnosis Date   Hypertension    Past Surgical History:  Procedure Laterality Date   COLON SURGERY     KNEE SURGERY  2009 or 2010   replaced knee cap   PROSTATE SURGERY  2012   Social History:  reports that he quit smoking about 43 years ago. He has never used smokeless tobacco. He reports that he does not drink alcohol and does not use drugs.  Allergies  Allergen Reactions   Hydralazine     Family History  Problem Relation Age of Onset   Cancer Mother        breast   Heart disease Father        heart attack at age 57    Prior to Admission medications   Medication Sig Start Date End Date Taking? Authorizing Provider  amLODipine (NORVASC) 10 MG tablet Take 10 mg by mouth every morning.  12/03/11   [provider]  KLOR-CON M20 20 MEQ tablet Take 20 mEq by mouth 2 (two) times daily.  12/03/11   [provider]  OVER THE COUNTER MEDICATION Place 2-3 drops into  both eyes daily as needed (dry eyes.).    [provider]  predniSONE (STERAPRED UNI-PAK 21 TAB) 10 MG (21) TBPK tablet Take 6 tabs by mouth daily  for 2 days, then 5 tabs for 2 days, then 4 tabs for 2 days, then 3 tabs for 2 days, 2 tabs for 2 days, then 1 tab by mouth daily for 2 days 11/17/16   Isla Pence, MD  UNKNOWN TO PATIENT Blood pressure pill    [provider]    Physical Exam: Vitals:   02/26/22 1259 02/26/22 1400 02/26/22 1430  BP: (!) 153/59 (!) 143/52 (!) 139/55  Pulse: 87 80 75  Resp: (!) 26 (!) 31 (!) 27  Temp: 98.1 F (36.7 C)    TempSrc: Oral    SpO2: 91% 98% 100%   General: 89 y.o. male resting in bed in NAD Eyes: PERRL, normal sclera ENMT: Nares patent w/o discharge, orophaynx clear, dentition normal, ears w/o discharge/lesions/ulcers Neck: Supple, trachea midline Cardiovascular: RRR, +S1, S2, no m/g/r, equal pulses throughout Respiratory: decreased LLL, no w/r/r, increased WOB on 5L [place] GI: BS+, NDNT, no masses noted, no organomegaly noted MSK: No e/c/c Neuro: A&O x 3, no focal deficits Psyc: Appropriate interaction and affect, calm/cooperative  Data Reviewed:  Results for orders placed or performed during the hospital encounter of 02/26/22 (from the past  24 hour(s))  CBG monitoring, ED     Status: Abnormal   Collection Time: 02/26/22  1:03 PM  Result Value Ref Range   Glucose-Capillary 108 (H) 70 - 99 mg/dL  Resp panel by RT-PCR (RSV, Flu A&B, Covid) Anterior Nasal Swab     Status: None   Collection Time: 02/26/22  1:07 PM   Specimen: Anterior Nasal Swab  Result Value Ref Range   SARS Coronavirus 2 by RT PCR NEGATIVE NEGATIVE   Influenza A by PCR NEGATIVE NEGATIVE   Influenza B by PCR NEGATIVE NEGATIVE   Resp Syncytial Virus by PCR NEGATIVE NEGATIVE  Lactic acid, plasma     Status: None   Collection Time: 02/26/22  1:07 PM  Result Value Ref Range   Lactic Acid, Venous 1.7 0.5 - 1.9 mmol/L  Comprehensive metabolic panel      Status: Abnormal   Collection Time: 02/26/22  1:07 PM  Result Value Ref Range   Sodium 135 135 - 145 mmol/L   Potassium 3.6 3.5 - 5.1 mmol/L   Chloride 101 98 - 111 mmol/L   CO2 22 22 - 32 mmol/L   Glucose, Bld 116 (H) 70 - 99 mg/dL   BUN 41 (H) 8 - 23 mg/dL   Creatinine, Ser 1.45 (H) 0.61 - 1.24 mg/dL   Calcium 9.0 8.9 - 10.3 mg/dL   Total Protein 6.6 6.5 - 8.1 g/dL   Albumin 2.9 (L) 3.5 - 5.0 g/dL   AST 24 15 - 41 U/L   ALT 33 0 - 44 U/L   Alkaline Phosphatase 74 38 - 126 U/L   Total Bilirubin 0.7 0.3 - 1.2 mg/dL   GFR, Estimated 47 (L) >60 mL/min   Anion gap 12 5 - 15  CBC with Differential     Status: Abnormal   Collection Time: 02/26/22  1:07 PM  Result Value Ref Range   WBC 44.5 (H) 4.0 - 10.5 K/uL   RBC 4.00 (L) 4.22 - 5.81 MIL/uL   Hemoglobin 11.2 (L) 13.0 - 17.0 g/dL   HCT 34.6 (L) 39.0 - 52.0 %   MCV 86.5 80.0 - 100.0 fL   MCH 28.0 26.0 - 34.0 pg   MCHC 32.4 30.0 - 36.0 g/dL   RDW 14.7 11.5 - 15.5 %   Platelets 449 (H) 150 - 400 K/uL   nRBC 0.0 0.0 - 0.2 %   Neutrophils Relative % 89 %   Neutro Abs 39.5 (H) 1.7 - 7.7 K/uL   Lymphocytes Relative 1 %   Lymphs Abs 0.3 (L) 0.7 - 4.0 K/uL   Monocytes Relative 6 %   Monocytes Absolute 2.7 (H) 0.1 - 1.0 K/uL   Eosinophils Relative 0 %   Eosinophils Absolute 0.0 0.0 - 0.5 K/uL   Basophils Relative 0 %   Basophils Absolute 0.1 0.0 - 0.1 K/uL   WBC Morphology VACUOLATED NEUTROPHILS    Immature Granulocytes 4 %   Abs Immature Granulocytes 1.88 (H) 0.00 - 0.07 K/uL  Protime-INR     Status: Abnormal   Collection Time: 02/26/22  1:07 PM  Result Value Ref Range   Prothrombin Time 20.3 (H) 11.4 - 15.2 seconds   INR 1.8 (H) 0.8 - 1.2  APTT     Status: Abnormal   Collection Time: 02/26/22  1:07 PM  Result Value Ref Range   aPTT 37 (H) 24 - 36 seconds  Blood Culture (routine x 2)     Status: None (Preliminary result)   Collection Time: 02/26/22  1:12 PM   Specimen: BLOOD LEFT FOREARM  Result Value Ref Range   Specimen  Description      BLOOD LEFT FOREARM Performed at [facility], [place] [address], [place], [place] [address]    Special Requests      BOTTLES DRAWN AEROBIC AND ANAEROBIC Blood Culture adequate volume Performed at [facility] [address] [place], [place] [address]    Culture PENDING    Report Status PENDING    CXR: Developing moderate left effusion with adjacent increasing opacity. Recommend follow-up.  CTA chest 1. No pulmonary embolus. 2. Moderate left-sided pleural effusion is partially loculated. 3. Complete opacification of the left lower lobe, with heterogeneous areas of low-density within the collapsed lung. The left lower lobe bronchus appears to be occluded. There is moderate debris in the left aspect of the trachea tracking into the left greater than right mainstem bronchi. Differential considerations include pneumonia (including aspiration pneumonia) as well as neoplasm. 4. Mildly prominent anterior right paratracheal node, likely reactive. 5. Coronary artery, aortic valvular, and mitral annulus calcifications. 6. Dilated main pulmonary artery suggesting pulmonary arterial hypertension.  Assessment and Plan: Acute hypoxic respiratory failure LLL PNA Loculated effusion     - admit to inpt, tele     - continue vanc, cefepime for now     - check Urine legionella/strep; expectorated sputum     - CTA w/ loculated effusion; spoke with pulm, will likely need chest tube and maybe lytics; they will see him, appreciate assistance     - nebs, guaifenesin     - wean O2 as able  CKD 3a     - at baseline, follow  HTN     - continue home regimen when confirmed  Normocytic anemia     - no evidence of bleed     - check iron studies  Advance Care Planning:   Code Status: FULL  Consults: Pulmonology  Family Communication: w/ wife at bedside  Severity of Illness: The appropriate patient status for this patient is INPATIENT. Inpatient status  is judged to be reasonable and necessary in order to provide the required intensity of service to ensure the patient's safety. The patient's presenting symptoms, physical exam findings, and initial radiographic and laboratory data in the context of their chronic comorbidities is felt to place them at high risk for further clinical deterioration. Furthermore, it is not anticipated that the patient will be medically stable for discharge from the hospital within 2 midnights of admission.   * I certify that at the point of admission it is my clinical judgment that the patient will require inpatient hospital care spanning beyond 2 midnights from the point of admission due to high intensity of service, high risk for further deterioration and high frequency of surveillance required.*  Author: Jonnie Finner, DO 02/26/2022 3:04 PM  For on call review [email or web address].

## 2022-02-26 NOTE — ED Notes (Signed)
Pt back from CT

## 2022-02-26 NOTE — ED Notes (Signed)
TIME OUT AT 1809

## 2022-02-26 NOTE — ED Notes (Signed)
Pt called out due accidentally pulling out his chest tube while attempting to use restroom. This Probation officer made floor coverage and ED provider aware as well. New orders being placed at this time.

## 2022-02-26 NOTE — ED Provider Notes (Signed)
Emergency Department Provider Note   I have reviewed the triage vital signs and the nursing notes.   HISTORY  Chief Complaint Code Sepsis   HPI David [name] is a 89 y.o. male past history of hypertension presents to the emergency department with continued shortness of breath and weakness.  Patient was recently admitted in the [facility] system on 12/18.  He had had several weeks of tachypnea with productive cough and borderline O2 requirement.  He was discharged on 12/20 with plan to finish Augmentin and azithromycin for 3 additional days and was given cough syrup.  Patient tells me that he did not feel much better at the time of discharge and ultimately presented to urgent care today and found to be hypoxemic and fatigued.  Patient denies any chest pain.  Has been feeling some subjective fevers. No vomiting, diarrhea, or abdominal pain.    Past Medical History:  Diagnosis Date   Hypertension     Review of Systems  Constitutional: No fever/chills Cardiovascular: Denies chest pain. Respiratory: Positive shortness of breath and cough.  Gastrointestinal: No abdominal pain.  No nausea, no vomiting.  No diarrhea.  No constipation. Genitourinary: Negative for dysuria. Musculoskeletal: Negative for back pain. Skin: Negative for rash. Neurological: Negative for headaches.  ____________________________________________   PHYSICAL EXAM:  VITAL SIGNS: ED Triage Vitals  Enc Vitals Group     BP 02/26/22 1259 (!) 153/59     Pulse Rate 02/26/22 1259 87     Resp 02/26/22 1259 (!) 26     Temp 02/26/22 1259 98.1 F (36.7 C)     Temp Source 02/26/22 1259 Oral     SpO2 02/26/22 1259 91 %   Constitutional: Alert and oriented. Well appearing and in no acute distress. Eyes: Conjunctivae are normal.  Head: Atraumatic. Nose: No congestion/rhinnorhea. Mouth/Throat: Mucous membranes are moist.  Neck: No stridor.   Cardiovascular: Normal rate, regular rhythm. Good peripheral  circulation. Grossly normal heart sounds.   Respiratory: Normal respiratory effort.  No retractions. Lungs CTAB. Gastrointestinal: Soft and nontender. No distention.  Musculoskeletal: No gross deformities of extremities. Neurologic:  Normal speech and language.  Skin:  Skin is warm, dry and intact. No rash noted.  ____________________________________________   LABS (all labs ordered are listed, but only abnormal results are displayed)  Labs Reviewed  COMPREHENSIVE METABOLIC PANEL - Abnormal; Notable for the following components:      Result Value   Glucose, Bld 116 (*)    BUN 41 (*)    Creatinine, Ser 1.45 (*)    Albumin 2.9 (*)    GFR, Estimated 47 (*)    All other components within normal limits  CBC WITH DIFFERENTIAL/PLATELET - Abnormal; Notable for the following components:   WBC 44.5 (*)    RBC 4.00 (*)    Hemoglobin 11.2 (*)    HCT 34.6 (*)    Platelets 449 (*)    Neutro Abs 39.5 (*)    Lymphs Abs 0.3 (*)    Monocytes Absolute 2.7 (*)    Abs Immature Granulocytes 1.88 (*)    All other components within normal limits  PROTIME-INR - Abnormal; Notable for the following components:   Prothrombin Time 20.3 (*)    INR 1.8 (*)    All other components within normal limits  APTT - Abnormal; Notable for the following components:   aPTT 37 (*)    All other components within normal limits  CBG MONITORING, ED - Abnormal; Notable for the following components:  Glucose-Capillary 108 (*)    All other components within normal limits  RESP PANEL BY RT-PCR (RSV, FLU A&B, COVID)  RVPGX2  CULTURE, BLOOD (ROUTINE X 2)  CULTURE, BLOOD (ROUTINE X 2)  URINE CULTURE  EXPECTORATED SPUTUM ASSESSMENT W GRAM STAIN, RFLX TO RESP C  LACTIC ACID, PLASMA  LACTIC ACID, PLASMA  URINALYSIS, ROUTINE W REFLEX MICROSCOPIC  STREP PNEUMONIAE URINARY ANTIGEN  LEGIONELLA PNEUMOPHILA SEROGP 1 UR AG   ____________________________________________  EKG   EKG Interpretation  Date/Time:  Thursday  February 26 2022 13:18:11 EST Ventricular Rate:  81 PR Interval:  191 QRS Duration: 143 QT Interval:  416 QTC Calculation: 483 R Axis:   -68 Text Interpretation: Sinus rhythm Atrial premature complex RBBB and LAFB Confirmed by Nanda Quinton [phone]) on 02/26/2022 2:32:55 PM        ____________________________________________  RADIOLOGY  CT Angio Chest PE W and/or Wo Contrast  Result Date: 02/26/2022 CLINICAL DATA:  Pulmonary embolism (PE) suspected, high prob Shortness of breath.  Recent pneumonia diagnosis. EXAM: CT ANGIOGRAPHY CHEST WITH CONTRAST TECHNIQUE: Multidetector CT imaging of the chest was performed using the standard protocol during bolus administration of intravenous contrast. Multiplanar CT image reconstructions and MIPs were obtained to evaluate the vascular anatomy. RADIATION DOSE REDUCTION: This exam was performed according to the departmental dose-optimization program which includes automated exposure control, adjustment of the mA and/or kV according to patient size and/or use of iterative reconstruction technique. CONTRAST:  164m OMNIPAQUE IOHEXOL 350 MG/ML SOLN COMPARISON:  Radiograph earlier today. Radiograph 02/09/2022. No prior chest CT available. FINDINGS: Cardiovascular: There are no filling defects within the pulmonary arteries to suggest pulmonary embolus. Atherosclerosis of the thoracic aorta. No dissection or acute aortic findings. The heart is upper normal in size. There are coronary artery, aortic valvular and mitral annulus calcifications. Dilated main pulmonary artery measuring 3.5 cm. Lobulated fluid adjacent to the anterior superior mediastinum is felt to represent loculated pleural fluid. Trace pericardial effusion. Mediastinum/Nodes: 11 mm anterior right paratracheal node series 5, image 51. Left hilar assessment is limited due to adjacent airspace disease. No right hilar adenopathy. The esophagus is decompressed. Lungs/Pleura: Breathing motion artifact in the lung  bases. Patient is partially loss, tract inguinal fissure as well as anterior and post early. No convincing pleural thickening. Complete collapse/atelectasis of the left lower lobe, with a heterogeneous areas of low-density within the collapsed lung. Presumed atelectasis within the lingula adjacent to pleural fluid. There is moderate debris in the left aspect of the trachea tracking into the left greater than right mainstem bronchi. The left lower lobe bronchus appears to be occluded. No discrete pulmonary nodules in the aerated lungs. No features of pulmonary edema. There is no right pleural effusion. Upper Abdomen: Abdominal aortic atherosclerosis. No acute findings in the included upper abdomen. Musculoskeletal: Exaggerated thoracic kyphosis with diffuse spondylosis. There are no acute or suspicious osseous abnormalities. No chest wall soft tissue abnormalities. Review of the MIP images confirms the above findings. IMPRESSION: 1. No pulmonary embolus. 2. Moderate left-sided pleural effusion is partially loculated. 3. Complete opacification of the left lower lobe, with heterogeneous areas of low-density within the collapsed lung. The left lower lobe bronchus appears to be occluded. There is moderate debris in the left aspect of the trachea tracking into the left greater than right mainstem bronchi. Differential considerations include pneumonia (including aspiration pneumonia) as well as neoplasm. 4. Mildly prominent anterior right paratracheal node, likely reactive. 5. Coronary artery, aortic valvular, and mitral annulus calcifications. 6. Dilated main pulmonary  artery suggesting pulmonary arterial hypertension. Aortic Atherosclerosis (ICD10-I70.0). Electronically Signed   By: Keith Rake M.D.   On: 02/26/2022 16:04   DG Chest Port 1 View  Result Date: 02/26/2022 CLINICAL DATA:  Sepsis.  Pneumonia 3 weeks ago EXAM: PORTABLE CHEST 1 VIEW COMPARISON:  Chest x-ray 02/09/2022 and older FINDINGS: Developing  moderate left pleural effusion with increasing adjacent opacity compared to the prior x-ray. No pneumothorax or edema. Enlarged cardiopericardial silhouette with calcified aorta. Films are under penetrated. Overlapping cardiac leads. Film is rotated to the left. IMPRESSION: Developing moderate left effusion with adjacent increasing opacity. Recommend follow-up. Electronically Signed   By: Jill Side M.D.   On: 02/26/2022 14:06    ____________________________________________   PROCEDURES  Procedure(s) performed:   .Critical Care  Performed by: Margette Fast, MD Authorized by: Margette Fast, MD   Critical care provider statement:    Critical care time (minutes):  45   Critical care time was exclusive of:  Separately billable procedures and treating other patients and teaching time   Critical care was necessary to treat or prevent imminent or life-threatening deterioration of the following conditions:  Respiratory failure   Critical care was time spent personally by me on the following activities:  Development of treatment plan with patient or surrogate, discussions with consultants, evaluation of patient's response to treatment, examination of patient, ordering and review of laboratory studies, ordering and review of radiographic studies, ordering and performing treatments and interventions, pulse oximetry, re-evaluation of patient's condition, review of old charts and obtaining history from patient or surrogate   I assumed direction of critical care for this patient from another provider in my specialty: no     Care discussed with: admitting provider      ____________________________________________   INITIAL IMPRESSION / [place] / ED COURSE  Pertinent labs & imaging results that were available during my care of the patient were reviewed by me and considered in my medical decision making (see chart for details).   This patient is Presenting for Evaluation of palpitations,  which does require a range of treatment options, and is a complaint that involves a high risk of morbidity and mortality.  The Differential Diagnoses   Critical Interventions-    Medications  0.9 %  sodium chloride infusion ( Intravenous New Bag/Given 02/26/22 1602)  vancomycin (VANCOREADY) IVPB 1500 mg/300 mL (1,500 mg Intravenous New Bag/Given 02/26/22 1606)  sodium chloride 0.9 % bolus 500 mL (0 mLs Intravenous Stopped 02/26/22 1554)  ceFEPIme (MAXIPIME) 2 g in sodium chloride 0.9 % 100 mL IVPB (2 g Intravenous New Bag/Given 02/26/22 1603)  iohexol (OMNIPAQUE) 350 MG/ML injection 100 mL (100 mLs Intravenous Contrast Given 02/26/22 1538)    Reassessment after intervention: O2 saturation improved.    I did obtain Additional Historical Information from EMS at bedside.   I decided to review pertinent External Data, and in summary patient with admit in the [facility] system in mid-December with LLL PNA.   Clinical Laboratory Tests Ordered, included significant leukocytosis to 44.5.  Lactic acid normal.  Respiratory panel negative for flu, COVID, RSV.  Renal insufficiency similar to prior.  Radiologic Tests Ordered, included CXR and CTA PE study. I independently interpreted the images and agree with radiology interpretation.   Cardiac Monitor Tracing which shows NSR.   Social Determinants of Health Risk patient is not an active smoker.   Consult complete with Hospitalist, Dr. Marylyn Ishihara. Plan for admit.   Medical Decision Making: Summary:  Patient  presents to the emergency department with shortness of breath and new oxygen requirement.  Requiring 5 L on arrival but appears stable on this.  Not requiring BiPAP or other airway interventions at this time.  He is awake and alert.  Significant leukocytosis consistent with pneumonia symptoms.  Respiratory panel negative.  Chest x-ray as above.  Planning to send for PE study but also possible get better characterization of the effusion noted on chest x-ray.    Reevaluation with update and discussion with patient.  Plan for antibiotics and.  He is in agreement.   Patient's presentation is most consistent with acute presentation with potential threat to life or bodily function.   Disposition: admit  ____________________________________________  FINAL CLINICAL IMPRESSION(S) / ED DIAGNOSES  Final diagnoses:  Acute respiratory failure with hypoxia (HCC)  Pleural effusion    Note:  This document was prepared using Dragon voice recognition software and may include unintentional dictation errors.  Nanda Quinton, MD, [facility] Emergency Medicine    Terese Heier, [facility], MD 02/26/22 [phone]

## 2022-02-26 NOTE — ED Notes (Signed)
Pt in CT.

## 2022-02-26 NOTE — Consult Note (Signed)
NAME:  David [name], MRN:  [id number], DOB:  June 06, 1933, LOS: 0 ADMISSION DATE:  02/26/2022, CONSULTATION DATE:  02/26/22 REFERRING MD:  Dr. Marylyn Ishihara, CHIEF COMPLAINT:  SOB   History of Present Illness:  89 y/o M who presented to [facility] on 1/4 with reports of shortness of breath.   The patient was recently admitted to [facility] from 12/18-12/20/2023 with working diagnosis of community acquired PNA.  He was discharged on Augmentin, Azithromycin and codeine cough syrup.  He followed up in primary care clinic on 12/29 and reported at that time that his symptoms had largely resolved short of residual cough.  He was given additional cough syrup Rx at that visit. His blood pressures were noted to have been elevated - he was taken off his blood pressure medications while in hospital for hypotension. Subsequently, he was seen for blood pressure medication adjustment.   He returned 1/4 to his PCP with reports of ongoing SOB. He reported difficulty walking to the bathroom. He reported a wet cough & difficulty catching his breath.  He was hypoxic in the clinic with room air saturations of 91% prompting ER referral via EMS.    In the ER, patient noted to require 4 L nasal cannula to main saturations.  Significant leukocytosis.  Creatinine appears at baseline.  BUN elevated.  Chest x-ray demonstrated left-sided infiltrate with what appears to be loculated pleural effusion on my review and interpretation.  Subsequent CTA PE protocol demonstrates no PE but loculated left-sided effusion and total atelectasis of the left lower lobe on my review and interpretation.  He was given antibiotics.  PCCM consulted for loculated pleural effusion.  Chest tube was placed in the emergency room by PCCM team.  Pertinent  Medical History  Hypertension  HLD CKD IIIa Pulmonary Hypertension  Cataracts  Arthritis  Renal Calculi   Significant Hospital Events: Including procedures, antibiotic start and stop dates in addition  to other pertinent events   1/4 Admit with SOB, empyema noted on CT imaging   Interim History / Subjective:    Objective   Blood pressure (!) 138/54, pulse 85, temperature 98.1 F (36.7 C), temperature source Oral, resp. rate (!) 26, SpO2 96 %.       No intake or output data in the 24 hours ending 02/26/22 1708 There were no vitals filed for this visit.  Examination: General: Elderly, frail HENT: No icterus, moist mucous membranes Lungs: Diminished on left, weak cough Cardiovascular: Warm, no edema Abdomen: Nontender, nondistended Neuro: Stands up and moves unassisted, no focal deficits    Resolved Hospital Problem list      Assessment & Plan:   Left Empyema versus parapneumonic effusion: Admitted 1218-1220 at [facility] with left-sided pneumonia on chest x-ray on my review of results.  Worsening cough, weakness prompting presentation to the ED.  Loculated left effusion.  Complicated on bedside ultrasound.  Chest tube placed 02/26/2022 -Chest tube to suction -- Daily chest x-ray with tube in place -Drainage, radiologic improvement, may require intrapleural lytic therapy to aid in draining space -Agree with broad-spectrum antibiotics, given recent hospitalization, reasonable to cover for MRSA empirically -Pleural fluid cultures  Acute hypoxemic respiratory failure: Related to left lower lobe atelectasis, shunt physiology. -- Antibiotics, drainage with chest tube as above, assess response -- O2 sat goal 90% or greater   Best Practice (right click and "Reselect all SmartList Selections" daily)  Per [facility]   Labs   CBC: Recent Labs  Lab 02/26/22 1307  WBC 44.5*  NEUTROABS 39.5*  HGB 11.2*  HCT 34.6*  MCV 86.5  PLT 449*    Basic Metabolic Panel: Recent Labs  Lab 02/26/22 1307  NA 135  K 3.6  CL 101  CO2 22  GLUCOSE 116*  BUN 41*  CREATININE 1.45*  CALCIUM 9.0   GFR: CrCl cannot be calculated (Unknown ideal weight.). Recent Labs  Lab 02/26/22 1307  WBC  44.5*  LATICACIDVEN 1.7    Liver Function Tests: Recent Labs  Lab 02/26/22 1307  AST 24  ALT 33  ALKPHOS 74  BILITOT 0.7  PROT 6.6  ALBUMIN 2.9*   No results for input(s): "LIPASE", "AMYLASE" in the last 168 hours. No results for input(s): "AMMONIA" in the last 168 hours.  ABG No results found for: "PHART", "PCO2ART", "PO2ART", "HCO3", "TCO2", "ACIDBASEDEF", "O2SAT"   Coagulation Profile: Recent Labs  Lab 02/26/22 1307  INR 1.8*    Cardiac Enzymes: No results for input(s): "CKTOTAL", "CKMB", "CKMBINDEX", "TROPONINI" in the last 168 hours.  HbA1C: Hgb A1c MFr Bld  Date/Time Value Ref Range Status  05/14/2015 11:34 AM 5.5 4.8 - 5.6 % Final    Comment:             Pre-diabetes: 5.7 - 6.4          Diabetes: >6.4          Glycemic control for adults with diabetes: <7.0     CBG: Recent Labs  Lab 02/26/22 1303  GLUCAP 108*    Review of Systems:   No fever.  Worsening cough.  Weakness.  No orthopnea or PND.  No chest pain.  Comprehensive review of systems otherwise negative.  Past Medical History:  He,  has a past medical history of Hypertension.   Surgical History:   Past Surgical History:  Procedure Laterality Date   COLON SURGERY     KNEE SURGERY  2009 or 2010   replaced knee cap   PROSTATE SURGERY  2012     Social History:   reports that he quit smoking about 43 years ago. He has never used smokeless tobacco. He reports that he does not drink alcohol and does not use drugs.   Family History:  His family history includes Cancer in his mother; Heart disease in his father.   Allergies Allergies  Allergen Reactions   Hydralazine      Home Medications  Prior to Admission medications   Medication Sig Start Date End Date Taking? Authorizing Provider  amLODipine (NORVASC) 10 MG tablet Take 10 mg by mouth every morning.  12/03/11   [provider]  KLOR-CON M20 20 MEQ tablet Take 20 mEq by mouth 2 (two) times daily.  12/03/11   [provider]  OVER THE COUNTER MEDICATION Place 2-3 drops into both eyes daily as needed (dry eyes.).    [provider]  predniSONE (STERAPRED UNI-PAK 21 TAB) 10 MG (21) TBPK tablet Take 6 tabs by mouth daily  for 2 days, then 5 tabs for 2 days, then 4 tabs for 2 days, then 3 tabs for 2 days, 2 tabs for 2 days, then 1 tab by mouth daily for 2 days 11/17/16   Isla Pence, MD  UNKNOWN TO PATIENT Blood pressure pill    [provider]     Critical care time:     Lanier Clam, MD See Shea Evans for contact info

## 2022-02-26 NOTE — ED Notes (Signed)
Consent filled out and signed by patient and NP Noe Gens

## 2022-02-26 NOTE — Progress Notes (Signed)
A consult was received from an ED physician for vancomycin + cefepime per pharmacy dosing.  The patient's profile has been reviewed for ht/wt/allergies/indication/available labs.    No weight entered for this encounter. Using weight of 76 kg recorded on 02/09/22 for dosing.  An order has been placed for cefepime 2 g IV once + vancomycin 1500 mg IV once.    Further antibiotics/pharmacy consults should be ordered by admitting physician if indicated.                       Thank you, Lenis Noon, PharmD 02/26/2022  2:58 PM

## 2022-02-26 NOTE — ED Triage Notes (Signed)
Patient BIB [facility] from urgent care. Diagnosed with pneumonia 3 weeks ago. Given antibiotics and completed them. Today went to urgent care for SOB, 90% room air. Placed on 2L and came up to 99%. Capnography 25. Breathing 35 times a minute. Code Sepsis called by EMS. Has pulmonary hypertension.   20G right wrist 174m fluid

## 2022-02-26 NOTE — Procedures (Signed)
Insertion of Chest Tube Procedure Note  David [name]  [id number]  1933-03-19  Date:02/26/22  Time:6:45 PM    Provider Performing: Noe Gens   Procedure: Chest Tube Insertion (00938)  Indication(s) Effusion  Consent Risks of the procedure as well as the alternatives and risks of each were explained to the patient and/or caregiver.  Consent for the procedure was obtained and is signed in the bedside chart  Anesthesia Topical only with 1% lidocaine    Time Out Verified patient identification, verified procedure, site/side was marked, verified correct patient position, special equipment/implants available, medications/allergies/relevant history reviewed, required imaging and test results available.   Sterile Technique Maximal sterile technique including full sterile barrier drape, hand hygiene, sterile gown, sterile gloves, mask, hair covering, sterile ultrasound probe cover (if used).   Procedure Description Ultrasound used to identify appropriate pleural anatomy for placement and overlying skin marked. Area of placement cleaned and draped in sterile fashion.  A 14 French pigtail pleural catheter was placed into the left pleural space using Seldinger technique. Appropriate return of yellow fluid was obtained.  The tube was connected to atrium and placed on -20 cm H2O wall suction.   Complications/Tolerance None; patient tolerated the procedure well. Chest X-ray is ordered to verify placement.   EBL Minimal  Specimen(s) none    Noe Gens, MSN, APRN, NP-C, AGACNP-BC [facility] 02/26/2022, 6:47 PM   Please see [email or web address] for pager details.   From 7A-7P if no response, please call [phone] After hours, please call ELink [phone]

## 2022-02-27 ENCOUNTER — Inpatient Hospital Stay (HOSPITAL_COMMUNITY): Payer: Medicare Other

## 2022-02-27 ENCOUNTER — Other Ambulatory Visit (HOSPITAL_COMMUNITY): Payer: PPO

## 2022-02-27 DIAGNOSIS — N1831 Chronic kidney disease, stage 3a: Secondary | ICD-10-CM | POA: Diagnosis not present

## 2022-02-27 DIAGNOSIS — J189 Pneumonia, unspecified organism: Secondary | ICD-10-CM | POA: Diagnosis present

## 2022-02-27 DIAGNOSIS — J9601 Acute respiratory failure with hypoxia: Secondary | ICD-10-CM | POA: Diagnosis not present

## 2022-02-27 DIAGNOSIS — J90 Pleural effusion, not elsewhere classified: Secondary | ICD-10-CM | POA: Diagnosis not present

## 2022-02-27 HISTORY — DX: Pneumonia, unspecified organism: J18.9

## 2022-02-27 LAB — URINE CULTURE: Culture: NO GROWTH

## 2022-02-27 LAB — CBC
HCT: 32.5 % — ABNORMAL LOW (ref 39.0–52.0)
Hemoglobin: 10.6 g/dL — ABNORMAL LOW (ref 13.0–17.0)
MCH: 28.3 pg (ref 26.0–34.0)
MCHC: 32.6 g/dL (ref 30.0–36.0)
MCV: 86.9 fL (ref 80.0–100.0)
Platelets: 452 10*3/uL — ABNORMAL HIGH (ref 150–400)
RBC: 3.74 MIL/uL — ABNORMAL LOW (ref 4.22–5.81)
RDW: 15.1 % (ref 11.5–15.5)
WBC: 39.9 10*3/uL — ABNORMAL HIGH (ref 4.0–10.5)
nRBC: 0 % (ref 0.0–0.2)

## 2022-02-27 LAB — COMPREHENSIVE METABOLIC PANEL
ALT: 32 U/L (ref 0–44)
AST: 40 U/L (ref 15–41)
Albumin: 2.5 g/dL — ABNORMAL LOW (ref 3.5–5.0)
Alkaline Phosphatase: 83 U/L (ref 38–126)
Anion gap: 10 (ref 5–15)
BUN: 40 mg/dL — ABNORMAL HIGH (ref 8–23)
CO2: 20 mmol/L — ABNORMAL LOW (ref 22–32)
Calcium: 8.7 mg/dL — ABNORMAL LOW (ref 8.9–10.3)
Chloride: 107 mmol/L (ref 98–111)
Creatinine, Ser: 1.2 mg/dL (ref 0.61–1.24)
GFR, Estimated: 59 mL/min — ABNORMAL LOW (ref >60)
Glucose, Bld: 103 mg/dL — ABNORMAL HIGH (ref 70–99)
Potassium: 3.5 mmol/L (ref 3.5–5.1)
Sodium: 137 mmol/L (ref 135–145)
Total Bilirubin: 0.6 mg/dL (ref 0.3–1.2)
Total Protein: 6 g/dL — ABNORMAL LOW (ref 6.5–8.1)

## 2022-02-27 LAB — IRON AND TIBC
Iron: 13 ug/dL — ABNORMAL LOW (ref 45–182)
Saturation Ratios: 15 % — ABNORMAL LOW (ref 17.9–39.5)
TIBC: 87 ug/dL — ABNORMAL LOW (ref 250–450)
UIBC: 74 ug/dL

## 2022-02-27 MED ORDER — ACETAMINOPHEN 325 MG PO TABS
650.0000 mg | ORAL_TABLET | Freq: Four times a day (QID) | ORAL | Status: DC | PRN
  Administered 2022-02-28 (×2): 650 mg via ORAL
  Filled 2022-02-27 (×2): qty 2

## 2022-02-27 MED ORDER — VANCOMYCIN HCL 1500 MG/300ML IV SOLN: 1500.0000 mg | Freq: Once | INTRAVENOUS | Status: DC

## 2022-02-27 MED ORDER — MORPHINE SULFATE (PF) 2 MG/ML IV SOLN
1.0000 mg | INTRAVENOUS | Status: DC | PRN
  Administered 2022-02-28: 1 mg via INTRAVENOUS
  Filled 2022-02-27: qty 1

## 2022-02-27 MED ORDER — GUAIFENESIN 100 MG/5ML PO LIQD: 5.0000 mL | ORAL | Status: DC | PRN

## 2022-02-27 MED ORDER — MELATONIN 5 MG PO TABS: 10.0000 mg | ORAL_TABLET | Freq: Every day | ORAL | Status: DC

## 2022-02-27 MED ORDER — PIPERACILLIN-TAZOBACTAM 3.375 G IVPB
3.3750 g | Freq: Three times a day (TID) | INTRAVENOUS | Status: DC
  Administered 2022-02-27 – 2022-03-02 (×9): 3.375 g via INTRAVENOUS
  Filled 2022-02-27 (×9): qty 50

## 2022-02-27 MED ORDER — MELATONIN 5 MG PO TABS
10.0000 mg | ORAL_TABLET | Freq: Every day | ORAL | Status: AC
  Administered 2022-02-27: 10 mg via ORAL
  Filled 2022-02-27: qty 2

## 2022-02-27 MED ORDER — MORPHINE SULFATE (PF) 2 MG/ML IV SOLN
INTRAVENOUS | Status: AC
  Administered 2022-02-27: 2 mg
  Filled 2022-02-27: qty 1

## 2022-02-27 MED ORDER — VANCOMYCIN HCL 1250 MG/250ML IV SOLN
1250.0000 mg | INTRAVENOUS | Status: DC
  Administered 2022-02-27 – 2022-03-01 (×3): 1250 mg via INTRAVENOUS
  Filled 2022-02-27 (×3): qty 250

## 2022-02-27 MED ORDER — MORPHINE SULFATE (PF) 2 MG/ML IV SOLN
1.0000 mg | Freq: Once | INTRAVENOUS | Status: AC
  Administered 2022-02-27: 1 mg via INTRAVENOUS

## 2022-02-27 MED ORDER — IPRATROPIUM-ALBUTEROL 0.5-2.5 (3) MG/3ML IN SOLN
3.0000 mL | Freq: Three times a day (TID) | RESPIRATORY_TRACT | Status: DC
  Administered 2022-02-27 – 2022-02-28 (×3): 3 mL via RESPIRATORY_TRACT
  Filled 2022-02-27 (×4): qty 3

## 2022-02-27 MED ORDER — LIDOCAINE HCL (CARDIAC) PF 100 MG/5ML IV SOSY
PREFILLED_SYRINGE | INTRAVENOUS | Status: AC
  Filled 2022-02-27: qty 5

## 2022-02-27 MED ORDER — ACETAMINOPHEN 650 MG RE SUPP: 650.0000 mg | Freq: Four times a day (QID) | RECTAL | Status: DC | PRN

## 2022-02-27 MED ORDER — SODIUM CHLORIDE 0.9 % IV SOLN
500.0000 mg | INTRAVENOUS | Status: DC
  Administered 2022-02-27: 500 mg via INTRAVENOUS
  Filled 2022-02-27: qty 5

## 2022-02-27 MED ORDER — SODIUM CHLORIDE 0.9 % IV SOLN
2.0000 g | INTRAVENOUS | Status: DC
  Administered 2022-02-27: 2 g via INTRAVENOUS
  Filled 2022-02-27: qty 20

## 2022-02-27 MED ORDER — IPRATROPIUM-ALBUTEROL 0.5-2.5 (3) MG/3ML IN SOLN: 3.0000 mL | Freq: Four times a day (QID) | RESPIRATORY_TRACT | Status: DC | PRN

## 2022-02-27 MED ORDER — MORPHINE SULFATE (PF) 2 MG/ML IV SOLN
0.5000 mg | Freq: Once | INTRAVENOUS | Status: AC
  Administered 2022-02-27: 0.5 mg via INTRAVENOUS
  Filled 2022-02-27: qty 1

## 2022-02-27 MED ORDER — SODIUM CHLORIDE 0.9 % IV SOLN: Freq: Once | INTRAVENOUS | Status: AC

## 2022-02-27 NOTE — ED Notes (Signed)
Pt was attempting to use restroom when his chest tube became dislodged. No bleeding noted at insertion site. Site still covered with Tegaderm. Floor coverage provider made aware as well as ED provider. New orders being placed at this time. Vitals being updated continuously.

## 2022-02-27 NOTE — Progress Notes (Addendum)
NAME:  David [name], MRN:  [id number], DOB:  February 16, 1934, LOS: 1 ADMISSION DATE:  02/26/2022, CONSULTATION DATE:  02/26/22 REFERRING MD:  Dr. Marylyn Ishihara, CHIEF COMPLAINT:  SOB   History of Present Illness:  89 y/o M who presented to [facility] on 1/4 with reports of shortness of breath.   The patient was recently admitted to [facility] from 12/18-12/20/2023 with working diagnosis of community acquired PNA.  He was discharged on Augmentin, Azithromycin and codeine cough syrup.  He followed up in primary care clinic on 12/29 and reported at that time that his symptoms had largely resolved short of residual cough.  He was given additional cough syrup Rx at that visit. His blood pressures were noted to have been elevated - he was taken off his blood pressure medications while in hospital for hypotension. Subsequently, he was seen for blood pressure medication adjustment.   He returned 1/4 to his PCP with reports of ongoing SOB. He reported difficulty walking to the bathroom. He reported a wet cough & difficulty catching his breath.  He was hypoxic in the clinic with room air saturations of 91% prompting ER referral via EMS.    In the ER, patient noted to require 4 L nasal cannula to main saturations.  Significant leukocytosis.  Creatinine appears at baseline.  BUN elevated.  Chest x-ray demonstrated left-sided infiltrate with what appears to be loculated pleural effusion on my review and interpretation.  Subsequent CTA PE protocol demonstrates no PE but loculated left-sided effusion and total atelectasis of the left lower lobe on my review and interpretation.  He was given antibiotics.  PCCM consulted for loculated pleural effusion.  Chest tube was placed in the emergency room by PCCM team.  Pertinent  Medical History  Hypertension  HLD CKD IIIa Pulmonary Hypertension  Cataracts  Arthritis  Renal Calculi   Significant Hospital Events: Including procedures, antibiotic start and stop dates in addition  to other pertinent events   1/4 Admit with SOB, loculated effusion noted on CT imaging. CT placed but with ~ 400 ml returned post placement.  Pt pulled chest tube out overnight.   Interim History / Subjective:  Patient pulled chest tube out overnight. Does not remember having one placed or pulling it out.   Objective   Blood pressure 132/73, pulse (!) 120, temperature 98.3 F (36.8 C), temperature source Oral, resp. rate 20, SpO2 99 %.       No intake or output data in the 24 hours ending 02/27/22 1400 There were no vitals filed for this visit.  Examination: General:  elderly adult male lying in bed in NAD  HEENT: MM pink/moist, anicteric, pupils =/reactive  Neuro: Awake/alert, oriented to self/place.  CV: s1s2 RRR, no m/r/g PULM: non-labored at rest, clear anterior, diminished posterior left base  GI: soft, bsx4 active  Extremities: warm/dry, trace BLE edema  Skin: no rashes or lesions    Resolved Hospital Problem list      Assessment & Plan:   Left Empyema versus Parapneumonic Effusion Admitted 12/18-12/20 at [facility] with left-sided pneumonia on chest x-ray. Worsening cough, weakness prompting presentation to the ED.  Loculated left effusion.  Complicated on bedside ultrasound.  Chest tube placed 02/26/2022 but pt pulled out overnight.   Exudative process by Light's.  -assess chest with [place] to determine if enough fluid to replace chest tube for evacuation of pleural space  -follow up pleural studies, cultures  -continue broad spectrum abx, note recent admission.  Change to zosyn /  vanco.  -would not narrow until cultures / gram stain available  -frank pus pulled out on second chest tube insertion > would consider 4 weeks abx duration after complete evacuation of pleural space with repeat imaging  -anticipate need for tPA / dornase on 1/6  Acute Hypoxic Respiratory Failure secondary to LLL Atelectasis, Shunt Physiology  -O2 to support sats >90% -abx as above  Best Practice  (right click and "Reselect all SmartList Selections" daily)  Per TRH   Critical care time:  n/a   Noe Gens, MSN, APRN, NP-C, AGACNP-BC [facility] 02/27/2022, 2:00 PM   Please see [email or web address] for pager details.   From 7A-7P if no response, please call [phone] After hours, please call ELink [phone]

## 2022-02-27 NOTE — Procedures (Signed)
Insertion of Chest Tube Procedure Note  David [name]  [id number]  22-Feb-1934  Date:02/27/22  Time:7:48 PM    Provider Performing: Noe Gens   Procedure: Pleural Catheter Insertion w/ Imaging Guidance [phone])  Indication(s) Effusion  Consent Risks of the procedure as well as the alternatives and risks of each were explained to the patient and/or caregiver.  Consent for the procedure was obtained and is signed in the bedside chart  Anesthesia Topical only with 1% lidocaine    Time Out Verified patient identification, verified procedure, site/side was marked, verified correct patient position, special equipment/implants available, medications/allergies/relevant history reviewed, required imaging and test results available.   Sterile Technique Maximal sterile technique including full sterile barrier drape, hand hygiene, sterile gown, sterile gloves, mask, hair covering, sterile ultrasound probe cover (if used).   Procedure Description Ultrasound used to identify appropriate pleural anatomy for placement and overlying skin marked. Area of placement cleaned and draped in sterile fashion.  A 13Fr French pigtail pleural catheter was placed into the left pleural space using Seldinger technique. Appropriate return of bloody pus was obtained.  The tube was connected to atrium and placed on -20 cm H2O wall suction.   Complications/Tolerance None; patient tolerated the procedure well. Chest X-ray is ordered to verify placement.   EBL Minimal  Specimen(s) none  Noe Gens, MSN, APRN, NP-C, AGACNP-BC  [facility] 02/27/2022, 7:49 PM   Please see [email or web address] for pager details.   From 7A-7P if no response, please call [phone] After hours, please call ELink [phone]

## 2022-02-27 NOTE — ED Notes (Signed)
Patient disconnecting self from monitors, nasal cannula, external condom catheter, IV fluids, and getting out of bed unassisted. Patient helped back into bed, connected to lines, and educated on using call light for any assistance.

## 2022-02-27 NOTE — Progress Notes (Signed)
Pharmacy Antibiotic Note  David [name] is a 89 y.o. male admitted on 02/26/2022 with pneumonia.  Pharmacy has been consulted for vancomycin dosing.  Pt with chest tube placement on 1/4, concern for left empyema vs parapneumonic effusion. Chest CT showed complete opaicification of left lower lobe. Left lower lobe bronchus occulusion suspected with moderate debris in trachea.     Plan: Vancomycin 1500 mg IV x1 given in ED, then vancomycin 1250 mg IV q24 ( AUC 536, SCr 1.20, Wt 74.9 kg on outpt visit on 02/20/22)  Azithromycin 500 mg IV q24h  Ceftriaxone 2 gr Iv q24h      Temp (24hrs), Avg:98.1 F (36.7 C), Min:97.9 F (36.6 C), Max:98.4 F (36.9 C)  Recent Labs  Lab 02/26/22 1307 02/26/22 2122 02/27/22 0816  WBC 44.5*  --  39.9*  CREATININE 1.45*  --  1.20  LATICACIDVEN 1.7 1.1  --     CrCl cannot be calculated (Unknown ideal weight.).    No Known Allergies  Antimicrobials this admission: 1/4 cefepime  x1  1/5 azithromycin >> ( 1/9)  1/5 ceftriaxone >> (1/9)  1/5 vancomycin >>   Dose adjustments this admission:   Microbiology results: 1/4 BCx:  1/4 UCx:   1/4 pleural fluid:  1/4 MRSA PCR:  Legionella antigen Strep pneumo antigen   Thank you for allowing pharmacy to be a part of this patient's care.   Royetta Asal, PharmD, BCPS 02/27/2022 11:31 AM

## 2022-02-27 NOTE — ED Notes (Signed)
Pt's oxygen level noted to be in low 90s with an increased heart rate. Notified another RN and we both went to room. Upon checking on pt, his nasal cannula is no longer properly on his face. Replaced [place] and pt's oxygen level increased back to upper 90s. Primary RN notified.

## 2022-02-27 NOTE — Progress Notes (Signed)
eLink Physician-Brief Progress Note Patient Name: David [name] DOB: 13-Dec-1933 MRN: [id number]   Date of Service  02/27/2022  HPI/Events of Note  Patient accidentally removed L chest tube placed earlier for L parapneumonic effusion. CXR post removal reveals: 1. Stable moderate size left-sided pleural effusion with stable moderate to marked severity airspace disease within the mid left lung and left lung base. 2. No pneumothorax appreciated.  O2 sat on 5 L/min [name] O2 = 94% and RR = 21.   eICU Interventions  Plan: Replacement of chest tube in AM vs VATS given complex, loculated effusion.  Will discuss with PCCM ground team.      Intervention Category Major Interventions: Other:  Lysle Dingwall 02/27/2022, 12:42 AM

## 2022-02-27 NOTE — Progress Notes (Signed)
       CROSS COVER NOTE  NAME: David [name] MRN: [id number] DOB : 1933/06/29    Date of Service   02/27/2022   HPI/Events of Note   Notified by bedside RN that patient had accidentally removed chest tube while attempting to get up to use restroom.    No respiratory distress at this time.  VSS.  Patient remains stable on 5 L nasal cannula.  PCCM has been notified.  Chest x-ray post removal ordered.  Stable left side pleural effusion noted on x-ray. negative pneumothorax.   Interventions/ Plan   Chest x-ray PCCM notified       Raenette Rover, [place], [place]

## 2022-02-27 NOTE — Progress Notes (Signed)
Pharmacy Antibiotic Note  David [name] is a 89 y.o. male admitted on 02/26/2022 with pneumonia.  Pharmacy initially consulted for vancomycin dosing; now asked to add Zosyn  Pt with chest tube placement on 1/4, concern for left empyema vs parapneumonic effusion. Chest CT showed complete opaicification of left lower lobe. Left lower lobe bronchus occulusion suspected with moderate debris in trachea.  CCM consulted.  Plan: Add Zosyn 3.375gm IV q8h (4hr extended infusions) Continue Vancomycin as previously ordered Follow SCr daily and Vanc/Zosyn combo can increase risk of nephrotoxicity  Height: 6' (182.9 cm) Weight: 76.7 kg (169 lb 1.5 oz) IBW/kg (Calculated) : 77.6  Temp (24hrs), Avg:98.1 F (36.7 C), Min:97.9 F (36.6 C), Max:98.4 F (36.9 C)  Recent Labs  Lab 02/26/22 1307 02/26/22 2122 02/27/22 0816  WBC 44.5*  --  39.9*  CREATININE 1.45*  --  1.20  LATICACIDVEN 1.7 1.1  --      Estimated Creatinine Clearance: 47.9 mL/min (by C-G formula based on SCr of 1.2 mg/dL).    No Known Allergies  Antimicrobials this admission: 1/4 cefepime  x1  1/5 azithromycin >> 1/5 1/5 ceftriaxone >> 1/5 1/5 vancomycin >>  1/5 zosyn >>  Dose adjustments this admission:   Microbiology results: 1/4 BCx:  1/4 UCx:   1/4 pleural fluid:  1/4 MRSA PCR:  Legionella antigen Strep pneumo antigen   Thank you for allowing pharmacy to be a part of this patient's care.   Peggyann Juba, PharmD, BCPS Pharmacy: [phone] 02/27/2022 4:42 PM

## 2022-02-27 NOTE — Hospital Course (Addendum)
89 year old man presenting with cough and shortness of breath.  PMH recent hospitalization for pneumonia at [facility] who improved and then subsequently developed more shortness of breath 1/4, emergency department was found to be hypoxic requiring 4 L nasal cannula, significant leukocytosis.  Chest x-ray demonstrated left-sided infiltrate, CTA showed left-sided effusion and total atelectasis left lower lobe.  Seen by pulmonology and chest tube placed.  Consultants Pulmonology   Procedures 1/4 Chest Tube Insertion. Pt accidentally pulled out. 1/5 Chest Tube Insertion 1/6 Pleural Fibrinolysis Initial day  1/7 Pleural Fibrinolysis Second day  1/8 Pleural Fibrinolysis Third day

## 2022-02-27 NOTE — ED Notes (Signed)
MD notified about patient's continuous tachycardia. Patient does not seem to be in any distress nor has any complaints at this time.

## 2022-02-27 NOTE — Progress Notes (Signed)
  Progress Note   Patient: David [name] YTK:[id number] DOB: [birth year] DOA: 02/26/2022     1 DOS: the patient was seen and examined on 02/27/2022   Brief hospital course: 89 year old man presenting with cough and shortness of breath.  PMH recent hospitalization for pneumonia at [facility] who improved and then subsequently developed more shortness of breath 1/4, emergency department was found to be hypoxic requiring 4 L nasal cannula, significant leukocytosis.  Chest x-ray demonstrated left-sided infiltrate, CTA showed left-sided effusion and total atelectasis left lower lobe.  Seen by pulmonology and chest tube placed.  Consultants Pulmonology   Procedures 1/4 Chest Tube Insertion   Assessment and Plan: Left empyema versus parapneumonic effusion Pneumonia  Acute hypoxic respiratory failure Chest CT showed complete opacification left lower lobe, left lower lobe bronchus occlusion suspected, moderate debris in trachea Tachypneic, tachycardic, on 5L [place] WBC 44.5 > 39.9 Chest tube placed 1/4, patient accidentally removed.  Further management per pulmonology. Follow-up blood cultures and pleural fluid culture. Continue supplemental oxygen   CKD stage IIIa Creatinine 1.45 > 1.2, probably at baseline was 1.37 July 2019   Essential hypertension     - continue home regimen when confirmed   Normocytic anemia Hgb 10.6, stable normal MCV     Subjective:  Patient accidentally removed chest tube overnight  Patient disconnecting self from monitors, nasal cannula, external condom catheter, IV fluids, and getting out of bed unassisted. Patient helped back into bed, connected to lines, and educated on using call light for any assistance.   Short of breath today.  Physical Exam: Vitals:   02/27/22 0354 02/27/22 0700 02/27/22 0826 02/27/22 0900  BP: (!) 169/60 129/69  125/72  Pulse: 84 (!) 119  (!) 117  Resp: (!) 30 (!) 30  (!) 26  Temp: 98.4 F (36.9 C)  97.9 F (36.6 C)   TempSrc: Oral   Oral   SpO2: 92% 98%  99%   Physical Exam Vitals reviewed.  Constitutional:      General: He is not in acute distress.    Appearance: He is ill-appearing. He is not toxic-appearing.  Cardiovascular:     Rate and Rhythm: Regular rhythm. Tachycardia present.     Heart sounds: No murmur heard. Pulmonary:     Effort: Pulmonary effort is normal. No respiratory distress.     Breath sounds: No wheezing, rhonchi or rales.  Neurological:     Mental Status: He is alert and oriented to person, place, and time.  Psychiatric:        Mood and Affect: Mood normal.        Behavior: Behavior normal.   Data Reviewed: Tachypneic, tachycardic, on 5L [place] Creatinine 1.45 > 1.2, probably at baseline was 1.37 July 2019 WBC 44.5 > 39.9 Hgb 10.6 normal MCV  Family Communication: none present  Disposition: Status is: Inpatient Remains inpatient appropriate because: suspected empyema  Planned Discharge Destination:  TBD    Time spent: 35 minutes  Author: Murray Hodgkins, MD 02/27/2022 11:06 AM  For on call review [email or web address].

## 2022-02-28 ENCOUNTER — Inpatient Hospital Stay (HOSPITAL_COMMUNITY): Payer: Medicare Other

## 2022-02-28 DIAGNOSIS — J869 Pyothorax without fistula: Secondary | ICD-10-CM | POA: Insufficient documentation

## 2022-02-28 DIAGNOSIS — J189 Pneumonia, unspecified organism: Secondary | ICD-10-CM | POA: Diagnosis not present

## 2022-02-28 DIAGNOSIS — J9601 Acute respiratory failure with hypoxia: Secondary | ICD-10-CM | POA: Diagnosis not present

## 2022-02-28 DIAGNOSIS — N1831 Chronic kidney disease, stage 3a: Secondary | ICD-10-CM | POA: Diagnosis not present

## 2022-02-28 HISTORY — DX: Pyothorax without fistula: J86.9

## 2022-02-28 LAB — CREATININE, SERUM
Creatinine, Ser: 1.07 mg/dL (ref 0.61–1.24)
GFR, Estimated: >60 mL/min (ref >60)

## 2022-02-28 MED ORDER — ASPIRIN 81 MG PO TBEC
81.0000 mg | DELAYED_RELEASE_TABLET | Freq: Every day | ORAL | Status: DC
  Administered 2022-02-28 – 2022-03-10 (×11): 81 mg via ORAL
  Filled 2022-02-28 (×11): qty 1

## 2022-02-28 MED ORDER — STERILE WATER FOR INJECTION IJ SOLN
5.0000 mg | Freq: Once | RESPIRATORY_TRACT | Status: AC
  Administered 2022-02-28: 5 mg via INTRAPLEURAL
  Filled 2022-02-28: qty 5

## 2022-02-28 MED ORDER — LEVALBUTEROL HCL 0.63 MG/3ML IN NEBU
0.6300 mg | INHALATION_SOLUTION | Freq: Three times a day (TID) | RESPIRATORY_TRACT | Status: DC
  Administered 2022-03-01 – 2022-03-02 (×4): 0.63 mg via RESPIRATORY_TRACT
  Filled 2022-02-28 (×5): qty 3

## 2022-02-28 MED ORDER — SODIUM CHLORIDE (PF) 0.9 % IJ SOLN
10.0000 mg | Freq: Once | INTRAMUSCULAR | Status: AC
  Administered 2022-02-28: 10 mg via INTRAPLEURAL
  Filled 2022-02-28: qty 10

## 2022-02-28 MED ORDER — TRAZODONE HCL 100 MG PO TABS
100.0000 mg | ORAL_TABLET | Freq: Every evening | ORAL | Status: DC | PRN
  Administered 2022-02-28 – 2022-03-01 (×2): 100 mg via ORAL
  Filled 2022-02-28 (×2): qty 1

## 2022-02-28 MED ORDER — ZINC SULFATE 220 (50 ZN) MG PO CAPS
220.0000 mg | ORAL_CAPSULE | Freq: Every day | ORAL | Status: DC
  Administered 2022-02-28 – 2022-03-10 (×11): 220 mg via ORAL
  Filled 2022-02-28 (×11): qty 1

## 2022-02-28 MED ORDER — SODIUM CHLORIDE 0.9% FLUSH
10.0000 mL | Freq: Three times a day (TID) | INTRAVENOUS | Status: DC
  Administered 2022-02-28 – 2022-03-02 (×3): 10 mL via INTRAPLEURAL

## 2022-02-28 MED ORDER — AMLODIPINE BESYLATE 10 MG PO TABS
10.0000 mg | ORAL_TABLET | Freq: Every day | ORAL | Status: DC
  Administered 2022-02-28 – 2022-03-10 (×10): 10 mg via ORAL
  Filled 2022-02-28 (×11): qty 1

## 2022-02-28 NOTE — Progress Notes (Addendum)
  Progress Note   Patient: David [name] HER:[id number] DOB: 12/14/1933 DOA: 02/26/2022     2 DOS: the patient was seen and examined on 02/28/2022   Brief hospital course: 89 year old man presenting with cough and shortness of breath.  PMH recent hospitalization for pneumonia at [facility] who improved and then subsequently developed more shortness of breath 1/4, emergency department was found to be hypoxic requiring 4 L nasal cannula, significant leukocytosis.  Chest x-ray demonstrated left-sided infiltrate, CTA showed left-sided effusion and total atelectasis left lower lobe.  Seen by pulmonology and chest tube placed.  Consultants Pulmonology   Procedures 1/4 Chest Tube Insertion. Pt accidentally pulled out. 1/5 Chest Tube Insertion  Assessment and Plan: Left empyema with marked leukocytosis  Pneumonia  Acute hypoxic respiratory failure Chest CT showed complete opacification left lower lobe, left lower lobe bronchus occlusion suspected, moderate debris in trachea Borderline tachypneic. Tachycardic, on 5L [place] Chest tube management per pulmonology. Plan is for lytics today. Discussed with Dr. Wonda Amis. Likely several weeks of antibiotics. Follow-up blood cultures and pleural fluid culture. Continue supplemental oxygen   CKD stage IIIa Creatinine 1.45 > 1.2, probably at baseline was 1.37 July 2019   Essential hypertension Stable. Resume amlodipine   Normocytic anemia Hgb 10.6, stable normal MCV     Subjective:  S/p replacement of chest tube 1/5 Feels about the same today, no improvement.  Physical Exam: Vitals:   02/27/22 2238 02/28/22 0100 02/28/22 0242 02/28/22 0600  BP: (!) 144/77   (!) 142/76  Pulse: (!) 123   (!) 123  Resp:    (!) 21  Temp: 98.4 F (36.9 C)  98.6 F (37 C) 98.3 F (36.8 C)  TempSrc: Oral  Oral Oral  SpO2: 98% 98% 98% 99%  Weight:      Height:       Physical Exam Vitals reviewed.  Constitutional:      General: He is not in acute distress.     Appearance: He is ill-appearing. He is not toxic-appearing.  Cardiovascular:     Rate and Rhythm: Normal rate and regular rhythm.     Heart sounds: No murmur heard. Pulmonary:     Effort: No respiratory distress.     Breath sounds: No wheezing, rhonchi or rales.     Comments: Decreased breath sounds left Neurological:     Mental Status: He is alert.  Psychiatric:        Mood and Affect: Mood normal.        Behavior: Behavior normal.     Data Reviewed: Remains tachycardic. Borderline tachypneic. Mildly hypertensive  Family Communication: wife and daughter at bedside  Disposition: Status is: Inpatient Remains inpatient appropriate because: severe lung infection, chest tube  Planned Discharge Destination:  TBD    Time spent: 25 minutes  Author: Murray Hodgkins, MD 02/28/2022 7:55 AM  For on call review [email or web address].

## 2022-02-28 NOTE — Plan of Care (Signed)
  Problem: Activity: Goal: Ability to tolerate increased activity will improve Outcome: Progressing   Problem: Respiratory: Goal: Ability to maintain adequate ventilation will improve Outcome: Progressing   Problem: Respiratory: Goal: Ability to maintain a clear airway will improve Outcome: Progressing   Problem: Clinical Measurements: Goal: Ability to maintain clinical measurements within normal limits will improve Outcome: Progressing   Problem: Activity: Goal: Risk for activity intolerance will decrease Outcome: Progressing   Problem: Coping: Goal: Level of anxiety will decrease Outcome: Progressing   Problem: Safety: Goal: Ability to remain free from injury will improve Outcome: Progressing

## 2022-02-28 NOTE — Procedures (Signed)
Pleural Fibrinolytic Administration Procedure Note  MINDY BEHNKEN  [id number]  [birth year]  Date:02/28/22  Time:3:55 PM   Provider Performing:Etoile Looman   Procedure: Pleural Fibrinolysis Initial day [phone])  Indication(s) Fibrinolysis of complicated pleural effusion  Consent Risks of the procedure as well as the alternatives and risks of each were explained to the patient and/or caregiver.  Consent for the procedure was obtained.   Anesthesia None   Time Out Verified patient identification, verified procedure, site/side was marked, verified correct patient position, special equipment/implants available, medications/allergies/relevant history reviewed, required imaging and test results available.   Sterile Technique Hand hygiene, gloves   Procedure Description Existing pleural catheter was cleaned and accessed in sterile manner.  '10mg'$  of tPA in 30cc of saline and '5mg'$  of dornase in 30cc of sterile water were injected into pleural space using existing pleural catheter.  Catheter will be clamped for 1 hour and then placed back to suction.   Complications/Tolerance None; patient tolerated the procedure well.   EBL None   Specimen(s) None   Marshell Garfinkel MD [facility] See Amion for pager  If no response to pager , please call [phone] until 7pm After 7:00 pm call Elink  [phone] 02/28/2022, 3:55 PM

## 2022-02-28 NOTE — Progress Notes (Signed)
NAME:  David [name], MRN:  [id number], DOB:  02-Jul-1933, LOS: 2 ADMISSION DATE:  02/26/2022, CONSULTATION DATE:  02/26/22 REFERRING MD:  Dr. Marylyn Ishihara, CHIEF COMPLAINT:  SOB   History of Present Illness:  89 y/o M who presented to [facility] on 1/4 with reports of shortness of breath.   The patient was recently admitted to [facility] from 12/18-12/20/2023 with working diagnosis of community acquired PNA.  He was discharged on Augmentin, Azithromycin and codeine cough syrup.  He followed up in primary care clinic on 12/29 and reported at that time that his symptoms had largely resolved short of residual cough.  He was given additional cough syrup Rx at that visit. His blood pressures were noted to have been elevated - he was taken off his blood pressure medications while in hospital for hypotension. Subsequently, he was seen for blood pressure medication adjustment.   He returned 1/4 to his PCP with reports of ongoing SOB. He reported difficulty walking to the bathroom. He reported a wet cough & difficulty catching his breath.  He was hypoxic in the clinic with room air saturations of 91% prompting ER referral via EMS.    In the ER, patient noted to require 4 L nasal cannula to main saturations.  Significant leukocytosis.  Creatinine appears at baseline.  BUN elevated.  Chest x-ray demonstrated left-sided infiltrate with what appears to be loculated pleural effusion on my review and interpretation.  Subsequent CTA PE protocol demonstrates no PE but loculated left-sided effusion and total atelectasis of the left lower lobe on my review and interpretation.  He was given antibiotics.  PCCM consulted for loculated pleural effusion.  Chest tube was placed in the emergency room by PCCM team.  Pertinent  Medical History  Hypertension  HLD CKD IIIa Pulmonary Hypertension  Cataracts  Arthritis  Renal Calculi   Significant Hospital Events: Including procedures, antibiotic start and stop dates in addition  to other pertinent events   1/4 Admit with SOB, loculated effusion noted on CT imaging. CT placed but with ~ 400 ml returned post placement.  Pt pulled chest tube out overnight.  1/5 Patient pulled chest tube out overnight and new chest tube placed  Interim History / Subjective:   Patient pulled out chest tube overnight and new chest tube placed. 300cc outout from chest tube  Objective   Blood pressure (!) 142/76, pulse (!) 123, temperature 98.3 F (36.8 C), temperature source Oral, resp. rate (!) 22, height 6' (1.829 m), weight 76.7 kg, SpO2 99 %.    FiO2 (%):  [40 %] 40 %   Intake/Output Summary (Last 24 hours) at 02/28/2022 1007 Last data filed at 02/28/2022 0600 Gross per 24 hour  Intake 914.94 ml  Output 1130 ml  Net -215.06 ml   Filed Weights   02/27/22 1400  Weight: 76.7 kg    Examination: Gen:      No acute distress, frail, elderly HEENT:  EOMI, sclera anicteric Neck:     No masses; no thyromegaly Lungs:    Reduced BS at left CV:         Regular rate and rhythm; no murmurs Abd:      + bowel sounds; soft, non-tender; no palpable masses, no distension Ext:    No edema; adequate peripheral perfusion Skin:      Warm and dry; no rash Neuro: alert and oriented x 3 Psych: normal mood and affect   Labs/imaging reviewed Significant for WBC count at 9.9, hemoglobin 10.6, platelets 452  Chest x-ray with persistent left lower lobe effusion, opacity.   Resolved Hospital Problem list      Assessment & Plan:   Left Empyema versus Parapneumonic Effusion Admitted 12/18-12/20 at [facility] with left-sided pneumonia on chest x-ray. Worsening cough, weakness prompting presentation to the ED.  Loculated left effusion.  Complicated on bedside ultrasound.  Chest tube placed 02/26/2022 but pt pulled out overnight.   Exudative process by Light's.  -assess chest with [place] to determine if enough fluid to replace chest tube for evacuation of pleural space  -follow up pleural studies, cultures   -continue broad spectrum abx, note recent admission.  Change to zosyn / vanco.  -would not narrow until cultures / gram stain available  -frank pus pulled out on second chest tube insertion > would consider 4 weeks abx duration after complete evacuation of pleural space with repeat imaging  -Plan for for tPA / dornase on 1/6  Acute Hypoxic Respiratory Failure secondary to LLL Atelectasis, Shunt Physiology  -O2 to support sats >90% -abx as above  Best Practice (right click and "Reselect all SmartList Selections" daily)   Per [facility]   Critical care time:  n/a   Marshell Garfinkel MD [place] [facility] See Amion for pager  If no response to pager , please call [phone] until 7pm After 7:00 pm call Elink  [phone] 02/28/2022, 10:10 AM

## 2022-03-01 ENCOUNTER — Inpatient Hospital Stay (HOSPITAL_COMMUNITY): Payer: Medicare Other

## 2022-03-01 DIAGNOSIS — J9601 Acute respiratory failure with hypoxia: Secondary | ICD-10-CM | POA: Diagnosis not present

## 2022-03-01 DIAGNOSIS — N1831 Chronic kidney disease, stage 3a: Secondary | ICD-10-CM | POA: Diagnosis not present

## 2022-03-01 DIAGNOSIS — J189 Pneumonia, unspecified organism: Secondary | ICD-10-CM | POA: Diagnosis not present

## 2022-03-01 DIAGNOSIS — J869 Pyothorax without fistula: Secondary | ICD-10-CM | POA: Diagnosis not present

## 2022-03-01 LAB — ALBUMIN, FLUID (OTHER): Albumin, Body Fluid Other: 2.2 g/dL

## 2022-03-01 LAB — BASIC METABOLIC PANEL
Anion gap: 7 (ref 5–15)
BUN: 28 mg/dL — ABNORMAL HIGH (ref 8–23)
CO2: 24 mmol/L (ref 22–32)
Calcium: 8.7 mg/dL — ABNORMAL LOW (ref 8.9–10.3)
Chloride: 108 mmol/L (ref 98–111)
Creatinine, Ser: 1.22 mg/dL (ref 0.61–1.24)
GFR, Estimated: 58 mL/min — ABNORMAL LOW (ref >60)
Glucose, Bld: 129 mg/dL — ABNORMAL HIGH (ref 70–99)
Potassium: 3.1 mmol/L — ABNORMAL LOW (ref 3.5–5.1)
Sodium: 139 mmol/L (ref 135–145)

## 2022-03-01 LAB — CBC
HCT: 31.5 % — ABNORMAL LOW (ref 39.0–52.0)
Hemoglobin: 10.2 g/dL — ABNORMAL LOW (ref 13.0–17.0)
MCH: 27.9 pg (ref 26.0–34.0)
MCHC: 32.4 g/dL (ref 30.0–36.0)
MCV: 86.3 fL (ref 80.0–100.0)
Platelets: 475 10*3/uL — ABNORMAL HIGH (ref 150–400)
RBC: 3.65 MIL/uL — ABNORMAL LOW (ref 4.22–5.81)
RDW: 15.4 % (ref 11.5–15.5)
WBC: 38.4 10*3/uL — ABNORMAL HIGH (ref 4.0–10.5)
nRBC: 0 % (ref 0.0–0.2)

## 2022-03-01 MED ORDER — OXYCODONE HCL 5 MG PO TABS
5.0000 mg | ORAL_TABLET | Freq: Every day | ORAL | Status: DC
  Administered 2022-03-01: 5 mg via ORAL
  Filled 2022-03-01: qty 1

## 2022-03-01 MED ORDER — OXYCODONE HCL 5 MG PO TABS
5.0000 mg | ORAL_TABLET | Freq: Four times a day (QID) | ORAL | Status: DC | PRN
  Administered 2022-03-01: 10 mg via ORAL
  Administered 2022-03-01 – 2022-03-04 (×3): 5 mg via ORAL
  Filled 2022-03-01: qty 1
  Filled 2022-03-01: qty 2
  Filled 2022-03-01 (×2): qty 1
  Filled 2022-03-01: qty 2

## 2022-03-01 MED ORDER — ENSURE ENLIVE PO LIQD: 237.0000 mL | Freq: Two times a day (BID) | ORAL | Status: DC

## 2022-03-01 MED ORDER — STERILE WATER FOR INJECTION IJ SOLN
5.0000 mg | Freq: Once | RESPIRATORY_TRACT | Status: AC
  Administered 2022-03-01: 5 mg via INTRAPLEURAL
  Filled 2022-03-01: qty 5

## 2022-03-01 MED ORDER — SODIUM CHLORIDE (PF) 0.9 % IJ SOLN
10.0000 mg | Freq: Once | INTRAMUSCULAR | Status: AC
  Administered 2022-03-01: 10 mg via INTRAPLEURAL
  Filled 2022-03-01: qty 10

## 2022-03-01 MED ORDER — SODIUM CHLORIDE 0.9% FLUSH
10.0000 mL | Freq: Three times a day (TID) | INTRAVENOUS | Status: DC
  Administered 2022-03-02 (×2): 10 mL via INTRAPLEURAL

## 2022-03-01 MED ORDER — MORPHINE SULFATE (PF) 2 MG/ML IV SOLN: 1.0000 mg | INTRAVENOUS | Status: DC | PRN

## 2022-03-01 NOTE — Progress Notes (Signed)
  Progress Note   Patient: David [name] MWN:[id number] DOB: 1933/07/18 DOA: 02/26/2022     3 DOS: the patient was seen and examined on 03/01/2022   Brief hospital course: 89 year old man presenting with cough and shortness of breath.  PMH recent hospitalization for pneumonia at [facility] who improved and then subsequently developed more shortness of breath 1/4, emergency department was found to be hypoxic requiring 4 L nasal cannula, significant leukocytosis.  Chest x-ray demonstrated left-sided infiltrate, CTA showed left-sided effusion and total atelectasis left lower lobe.  Seen by pulmonology and chest tube placed.  Consultants Pulmonology   Procedures 1/4 Chest Tube Insertion. Pt accidentally pulled out. 1/5 Chest Tube Insertion 1/6 Pleural Fibrinolysis Initial day  1/7 Pleural Fibrinolysis Second day   Assessment and Plan: Left empyema with marked leukocytosis  Pneumonia  Acute hypoxic respiratory failure Chest CT showed complete opacification left lower lobe, left lower lobe bronchus occlusion suspected, moderate debris in trachea Tachypnea resolved. Tachycardic, but down to 4L [place].  Day 2 of lytics. BC NGTD.Pleural fluid culture with Strep intermedius, sensitivity pending. Continue supplemental oxygen   CKD stage IIIa Creatinine 1.45 > 1.2 >1.22, appears to be at baseline. Baseline was 1.37 July 2019   Essential hypertension Remains stable. Continue amlodipine   Normocytic anemia Hgb 10.2, stable normal MCV       Subjective: feels about the same still  Physical Exam: Vitals:   03/01/22 0001 03/01/22 0437 03/01/22 0744 03/01/22 0900  BP: (!) 116/55 125/80  117/63  Pulse: (!) 125 (!) 125  (!) 126  Resp: 17 19  (!) 21  Temp: 97.9 F (36.6 C) 98 F (36.7 C)  97.7 F (36.5 C)  TempSrc: Oral Oral  Oral  SpO2: 96% 98% 97%   Weight:      Height:       Physical Exam Vitals reviewed.  Constitutional:      General: He is not in acute distress.    Appearance: He is  ill-appearing. He is not toxic-appearing.  Cardiovascular:     Rate and Rhythm: Regular rhythm. Tachycardia present.     Heart sounds: No murmur heard. Pulmonary:     Effort: No respiratory distress.     Breath sounds: No wheezing, rhonchi or rales.     Comments: Mild increased respiratory effort Neurological:     Mental Status: He is alert.  Psychiatric:        Mood and Affect: Mood normal.        Behavior: Behavior normal.     Data Reviewed: BC NGTD.Pleural fluid culture with Strep intermedius, n Creatinine 1.45 > 1.2 >1.22, appears to be at baseline.  Hgb 10.2, stable normal MCV  Family Communication: son at bedside  Disposition: Status is: Inpatient Remains inpatient appropriate because: empyema  Planned Discharge Destination: Home    Time spent: 25 minutes  Author: Murray Hodgkins, MD 03/01/2022 2:49 PM  For on call review [email or web address].

## 2022-03-01 NOTE — Progress Notes (Signed)
PT Cancellation Note  Patient Details Name: KYLYN SOOKRAM MRN: [id number] DOB: 1934-01-01   Cancelled Treatment:    Reason Eval/Treat Not Completed: Medical issues which prohibited therapy "clot buster" given through chest tube.  RN reports pt needs to remain in bed at this time.  Will check back as schedule permits.   Myrtis Hopping [place] 03/01/2022, 12:28 PM Jannette Spanner PT, DPT Physical Therapist Acute Rehabilitation Services Preferred contact method: Secure Chat Weekend Pager Only: [phone] Office: [phone]

## 2022-03-01 NOTE — Progress Notes (Signed)
NAME:  David [name], MRN:  [id number], DOB:  [birth year], LOS: 3 ADMISSION DATE:  02/26/2022, CONSULTATION DATE:  02/26/22 REFERRING MD:  Dr. Marylyn Ishihara, CHIEF COMPLAINT:  SOB   History of Present Illness:  89 y/o M who presented to [facility] on 1/4 with reports of shortness of breath.   The patient was recently admitted to [facility] from 12/18-12/20/2023 with working diagnosis of community acquired PNA.  He was discharged on Augmentin, Azithromycin and codeine cough syrup.  He followed up in primary care clinic on 12/29 and reported at that time that his symptoms had largely resolved short of residual cough.  He was given additional cough syrup Rx at that visit. His blood pressures were noted to have been elevated - he was taken off his blood pressure medications while in hospital for hypotension. Subsequently, he was seen for blood pressure medication adjustment.   He returned 1/4 to his PCP with reports of ongoing SOB. He reported difficulty walking to the bathroom. He reported a wet cough & difficulty catching his breath.  He was hypoxic in the clinic with room air saturations of 91% prompting ER referral via EMS.    In the ER, patient noted to require 4 L nasal cannula to main saturations.  Significant leukocytosis.  Creatinine appears at baseline.  BUN elevated.  Chest x-ray demonstrated left-sided infiltrate with what appears to be loculated pleural effusion on my review and interpretation.  Subsequent CTA PE protocol demonstrates no PE but loculated left-sided effusion and total atelectasis of the left lower lobe on my review and interpretation.  He was given antibiotics.  PCCM consulted for loculated pleural effusion.  Chest tube was placed in the emergency room by PCCM team.  Pertinent  Medical History  Hypertension  HLD CKD IIIa Pulmonary Hypertension  Cataracts  Arthritis  Renal Calculi   Significant Hospital Events: Including procedures, antibiotic start and stop dates in addition  to other pertinent events   1/4 Admit with SOB, loculated effusion noted on CT imaging. CT placed but with ~ 400 ml returned post placement.  Pt pulled chest tube out overnight.  1/5 Patient pulled chest tube out overnight and new chest tube placed 1/6 intrapleural lytics  Interim History / Subjective:   First dose of intrapleural lytics given yesterday.  Has total 1.7 L output from chest tube  Objective   Blood pressure 117/63, pulse (!) 126, temperature 97.7 F (36.5 C), temperature source Oral, resp. rate (!) 21, height 6' (1.829 m), weight 76.7 kg, SpO2 97 %.        Intake/Output Summary (Last 24 hours) at 03/01/2022 1003 Last data filed at 03/01/2022 0725 Gross per 24 hour  Intake 1121.73 ml  Output 2597 ml  Net -1475.27 ml   Filed Weights   02/27/22 1400  Weight: 76.7 kg    Examination: Gen:      No acute distress, frail, elderly HEENT:  EOMI, sclera anicteric Neck:     No masses; no thyromegaly Lungs:    Clear to auscultation bilaterally; normal respiratory effort CV:         Regular rate and rhythm; no murmurs Abd:      + bowel sounds; soft, non-tender; no palpable masses, no distension Ext:    No edema; adequate peripheral perfusion Skin:      Warm and dry; no rash Neuro: alert and oriented x 3  Labs/imaging reviewed WBC count elevated to 38.4 Chest x-ray with improved left lower lobe effusion, opacity.  Resolved Hospital Problem list      Assessment & Plan:  Left empyema Streptococcus intermedius pneumonia Admitted 12/18-12/20 at [facility] with left-sided pneumonia on chest x-ray.  Frank pus pulled out on second chest tube insertion > would consider 4 weeks abx duration after complete evacuation of pleural space with repeat imaging  Continue antibiotic coverage with Zosyn, Vanco.  Awaiting sensitivities on cultures. Plan for for second dose of tPA / dornase  Acute Hypoxic Respiratory Failure secondary to LLL Atelectasis, Shunt Physiology  Wean down oxygen  as tolerated   Best Practice (right click and "Reselect all SmartList Selections" daily)   Per [facility]   Critical care time:  n/a   Marshell Garfinkel MD [place] [facility] See Amion for pager  If no response to pager , please call [phone] until 7pm After 7:00 pm call Elink  [phone] 03/01/2022, 10:03 AM

## 2022-03-01 NOTE — Procedures (Signed)
Pleural Fibrinolytic Administration Procedure Note  JOHAAN RYSER  [id number]  [birth year]  Date:03/01/22  Time:3:02 PM   Provider Performing:Chele Cornell   Procedure: Pleural Fibrinolysis Subsequent day (93716)  Indication(s) Fibrinolysis of complicated pleural effusion  Consent Risks of the procedure as well as the alternatives and risks of each were explained to the patient and/or caregiver.  Consent for the procedure was obtained.   Anesthesia None   Time Out Verified patient identification, verified procedure, site/side was marked, verified correct patient position, special equipment/implants available, medications/allergies/relevant history reviewed, required imaging and test results available.   Sterile Technique Hand hygiene, gloves   Procedure Description Existing pleural catheter was cleaned and accessed in sterile manner.  '10mg'$  of tPA in 30cc of saline and '5mg'$  of dornase in 30cc of sterile water were injected into pleural space using existing pleural catheter.  Catheter will be clamped for 1 hour and then placed back to suction.   Complications/Tolerance None; patient tolerated the procedure well.   EBL None   Specimen(s) None   Marshell Garfinkel MD [place] [facility] See Amion for pager  If no response to pager , please call [phone] until 7pm After 7:00 pm call Elink  [phone] 03/01/2022, 3:03 PM

## 2022-03-02 ENCOUNTER — Inpatient Hospital Stay (HOSPITAL_COMMUNITY): Payer: Medicare Other

## 2022-03-02 DIAGNOSIS — J189 Pneumonia, unspecified organism: Secondary | ICD-10-CM | POA: Diagnosis not present

## 2022-03-02 DIAGNOSIS — E43 Unspecified severe protein-calorie malnutrition: Secondary | ICD-10-CM

## 2022-03-02 DIAGNOSIS — J869 Pyothorax without fistula: Secondary | ICD-10-CM | POA: Diagnosis not present

## 2022-03-02 DIAGNOSIS — J9601 Acute respiratory failure with hypoxia: Secondary | ICD-10-CM | POA: Diagnosis not present

## 2022-03-02 HISTORY — DX: Unspecified severe protein-calorie malnutrition: E43

## 2022-03-02 LAB — BODY FLUID CULTURE W GRAM STAIN

## 2022-03-02 LAB — CYTOLOGY - NON PAP

## 2022-03-02 LAB — CREATININE, SERUM
Creatinine, Ser: 1.64 mg/dL — ABNORMAL HIGH (ref 0.61–1.24)
GFR, Estimated: 40 mL/min — ABNORMAL LOW (ref >60)

## 2022-03-02 MED ORDER — LEVALBUTEROL HCL 0.63 MG/3ML IN NEBU
0.6300 mg | INHALATION_SOLUTION | Freq: Two times a day (BID) | RESPIRATORY_TRACT | Status: DC
  Administered 2022-03-02 – 2022-03-07 (×11): 0.63 mg via RESPIRATORY_TRACT
  Filled 2022-03-02 (×11): qty 3

## 2022-03-02 MED ORDER — SODIUM CHLORIDE 0.9% FLUSH
10.0000 mL | Freq: Three times a day (TID) | INTRAVENOUS | Status: DC
  Administered 2022-03-02 – 2022-03-04 (×5): 10 mL via INTRAPLEURAL

## 2022-03-02 MED ORDER — PENICILLIN G POTASSIUM 20000000 UNITS IJ SOLR
4.0000 10*6.[IU] | Freq: Four times a day (QID) | INTRAVENOUS | Status: DC
  Administered 2022-03-02 – 2022-03-04 (×6): 4 10*6.[IU] via INTRAVENOUS
  Filled 2022-03-02 (×9): qty 4

## 2022-03-02 MED ORDER — ORAL CARE MOUTH RINSE
15.0000 mL | OROMUCOSAL | Status: DC
  Administered 2022-03-02 – 2022-03-10 (×24): 15 mL via OROMUCOSAL

## 2022-03-02 MED ORDER — VANCOMYCIN HCL 1750 MG/350ML IV SOLN: 1750.0000 mg | INTRAVENOUS | Status: DC

## 2022-03-02 MED ORDER — STERILE WATER FOR INJECTION IJ SOLN
5.0000 mg | Freq: Once | RESPIRATORY_TRACT | Status: AC
  Administered 2022-03-02: 5 mg via INTRAPLEURAL
  Filled 2022-03-02: qty 5

## 2022-03-02 MED ORDER — MELATONIN 3 MG PO TABS
3.0000 mg | ORAL_TABLET | Freq: Once | ORAL | Status: AC
  Administered 2022-03-02: 3 mg via ORAL
  Filled 2022-03-02: qty 1

## 2022-03-02 MED ORDER — SODIUM CHLORIDE (PF) 0.9 % IJ SOLN
10.0000 mg | Freq: Once | INTRAMUSCULAR | Status: AC
  Administered 2022-03-02: 10 mg via INTRAPLEURAL
  Filled 2022-03-02: qty 10

## 2022-03-02 MED ORDER — ENSURE ENLIVE PO LIQD
237.0000 mL | Freq: Three times a day (TID) | ORAL | Status: DC
  Administered 2022-03-02 – 2022-03-06 (×5): 237 mL via ORAL

## 2022-03-02 MED ORDER — ADULT MULTIVITAMIN W/MINERALS CH
1.0000 | ORAL_TABLET | Freq: Every day | ORAL | Status: DC
  Administered 2022-03-03 – 2022-03-10 (×8): 1 via ORAL
  Filled 2022-03-02 (×8): qty 1

## 2022-03-02 MED ORDER — ORAL CARE MOUTH RINSE: 15.0000 mL | OROMUCOSAL | Status: DC | PRN

## 2022-03-02 NOTE — Plan of Care (Signed)
  Problem: Respiratory: Goal: Ability to maintain adequate ventilation will improve 03/02/2022 0535 by Gregary Signs, RN Outcome: Progressing 03/02/2022 0534 by Gregary Signs, RN Outcome: Progressing   Problem: Respiratory: Goal: Ability to maintain a clear airway will improve 03/02/2022 0535 by Gregary Signs, RN Outcome: Progressing 03/02/2022 0534 by Gregary Signs, RN Outcome: Progressing   Problem: Clinical Measurements: Goal: Respiratory complications will improve 03/02/2022 0535 by Gregary Signs, RN Outcome: Progressing 03/02/2022 0534 by Gregary Signs, RN Outcome: Progressing   Problem: Safety: Goal: Ability to remain free from injury will improve 03/02/2022 0535 by Gregary Signs, RN Outcome: Progressing 03/02/2022 0534 by Gregary Signs, RN Outcome: Progressing   Problem: Skin Integrity: Goal: Risk for impaired skin integrity will decrease 03/02/2022 0535 by Gregary Signs, RN Outcome: Progressing 03/02/2022 0534 by Gregary Signs, RN Outcome: Progressing

## 2022-03-02 NOTE — Care Management Important Message (Signed)
Important Message  Patient Details IM Letter given. Name: David [name] MRN: [id number] Date of Birth: 1933/06/17   Medicare Important Message Given:  Yes     Kerin Salen 03/02/2022, 1:55 PM

## 2022-03-02 NOTE — Plan of Care (Signed)
  Problem: Activity: Goal: Ability to tolerate increased activity will improve Outcome: Completed/Met   Problem: Education: Goal: Knowledge of General Education information will improve Description: Including pain rating scale, medication(s)/side effects and non-pharmacologic comfort measures Outcome: Completed/Met

## 2022-03-02 NOTE — Progress Notes (Signed)
Initial Nutrition Assessment  DOCUMENTATION CODES:   Severe malnutrition in context of acute illness/injury  INTERVENTION:   -Ensure Plus High Protein po TID, each supplement provides 350 kcal and 20 grams of protein.   -Magic cup TID with meals, each supplement provides 290 kcal and 9 grams of protein   -Multivitamin with minerals daily  NUTRITION DIAGNOSIS:   Severe Malnutrition related to acute illness (CAP) as evidenced by moderate fat depletion, severe muscle depletion, energy intake < or equal to 50% for > or equal to 1 month, percent weight loss.  GOAL:   Patient will meet greater than or equal to 90% of their needs  MONITOR:   PO intake, Supplement acceptance, Weight trends, Labs, I & O's  REASON FOR ASSESSMENT:   Consult Assessment of nutrition requirement/status  ASSESSMENT:   89 year old man presenting with cough and shortness of breath.  PMH recent hospitalization for pneumonia at [facility] who improved and then subsequently developed more shortness of breath 1/4, emergency department was found to be hypoxic requiring 4 L nasal cannula, significant leukocytosis.  Chest x-ray demonstrated left-sided infiltrate, CTA showed left-sided effusion and total atelectasis left lower lobe.  Seen by pulmonology and chest tube placed.  1/4: accidentally pulled chest tube out 1/5: chest tube replaced 1/6-1/8: pleural fibrinolysis  Patient in room, wife and daughter at bedside. History mainly from family. Pt has not been eating well since having pneumonia over the past 3 weeks per family. It has been difficult to get the patient to eat per family. Denies pt has had any issues with swallowing or chewing. Pt likes ice cream, so will order Magic cups. Will increase Ensure to TID.  Per family, pt's UBW is ~200 lbs, last weighed 6-7 months ago. In November 2023, he weighed ~180 lbs. Pt has lost 31 lbs, this is a 15% wt loss x 6-7 months, significant for time frame.  Medications:  Zinc sulfate  Labs reviewed: Low K Low iron    NUTRITION - FOCUSED PHYSICAL EXAM:  Flowsheet Row Most Recent Value  Orbital Region Moderate depletion  Upper Arm Region Moderate depletion  Thoracic and Lumbar Region Moderate depletion  Buccal Region Moderate depletion  Temple Region Moderate depletion  Clavicle Bone Region Severe depletion  Clavicle and Acromion Bone Region Severe depletion  Scapular Bone Region Moderate depletion  Dorsal Hand Moderate depletion  Patellar Region Mild depletion  Anterior Thigh Region Mild depletion  Posterior Calf Region Mild depletion  Edema (RD Assessment) None  Hair Reviewed  Eyes Reviewed  Mouth Reviewed  [dry from mouth breathing]  Skin Reviewed       Diet Order:   Diet Order             Diet regular Room service appropriate? Yes; Fluid consistency: Thin  Diet effective now                   EDUCATION NEEDS:   No education needs have been identified at this time  Skin:  Skin Assessment: Reviewed RN Assessment  Last BM:  1/8 -type 6  Height:   Ht Readings from Last 1 Encounters:  02/27/22 6' (1.829 m)    Weight:   Wt Readings from Last 1 Encounters:  02/27/22 76.7 kg    BMI:  Body mass index is 22.93 kg/m.  Estimated Nutritional Needs:   Kcal:  2200-2400  Protein:  100-115g  Fluid:  2.2L/day  Clayton Bibles, MS, RD, LDN Inpatient Clinical Dietitian Contact information available via Amion

## 2022-03-02 NOTE — Procedures (Signed)
Pleural Fibrinolytic Administration Procedure Note  BYRNE CAPEK  [id number]  02-13-1934  Date:03/02/22  Time:11:44 AM   Provider Performing:Roizy Harold W [place]   Procedure: Pleural Fibrinolysis Subsequent day [id number])  Indication(s) Fibrinolysis of complicated pleural effusion  Consent Risks of the procedure as well as the alternatives and risks of each were explained to the patient and/or caregiver.  Consent for the procedure was obtained.   Anesthesia None   Time Out Verified patient identification, verified procedure, site/side was marked, verified correct patient position, special equipment/implants available, medications/allergies/relevant history reviewed, required imaging and test results available.   Sterile Technique Hand hygiene, gloves   Procedure Description Existing pleural catheter was cleaned and accessed in sterile manner.  '10mg'$  of tPA in 30cc of saline and '5mg'$  of dornase in 30cc of sterile water were injected into pleural space using existing pleural catheter.  Catheter will be clamped for 1 hour and then placed back to suction.   Complications/Tolerance None; patient tolerated the procedure well.  EBL None   Specimen(s) None   Georgann Housekeeper, AGACNP-BC [place] [facility]  See Amion for personal pager PCCM on call pager [phone] until 7pm. Please call Elink 7p-7a. [phone]  03/02/2022 11:44 AM

## 2022-03-02 NOTE — Progress Notes (Signed)
NAME:  David [name], MRN:  [id number], DOB:  25-Sep-1933, LOS: 4 ADMISSION DATE:  02/26/2022, CONSULTATION DATE:  02/26/22 REFERRING MD:  Dr. Marylyn Ishihara, CHIEF COMPLAINT:  SOB   History of Present Illness:  89 y/o M who presented to [facility] on 1/4 with reports of shortness of breath.   The patient was recently admitted to [facility] from 12/18-12/20/2023 with working diagnosis of community acquired PNA.  He was discharged on Augmentin, Azithromycin and codeine cough syrup.  He followed up in primary care clinic on 12/29 and reported at that time that his symptoms had largely resolved short of residual cough.  He was given additional cough syrup Rx at that visit. His blood pressures were noted to have been elevated - he was taken off his blood pressure medications while in hospital for hypotension. Subsequently, he was seen for blood pressure medication adjustment.   He returned 1/4 to his PCP with reports of ongoing SOB. He reported difficulty walking to the bathroom. He reported a wet cough & difficulty catching his breath.  He was hypoxic in the clinic with room air saturations of 91% prompting ER referral via EMS.    In the ER, patient noted to require 4 L nasal cannula to main saturations.  Significant leukocytosis.  Creatinine appears at baseline.  BUN elevated.  Chest x-ray demonstrated left-sided infiltrate with what appears to be loculated pleural effusion on my review and interpretation.  Subsequent CTA PE protocol demonstrates no PE but loculated left-sided effusion and total atelectasis of the left lower lobe on my review and interpretation.  He was given antibiotics.  PCCM consulted for loculated pleural effusion.  Chest tube was placed in the emergency room by PCCM team.  Pertinent  Medical History  Hypertension  HLD CKD IIIa Pulmonary Hypertension  Cataracts  Arthritis  Renal Calculi   Significant Hospital Events: Including procedures, antibiotic start and stop dates in addition  to other pertinent events   1/4 Admit with SOB, loculated effusion noted on CT imaging. CT placed but with ~ 400 ml returned post placement.  Pt pulled chest tube out overnight.  1/5 Patient pulled chest tube out overnight and new chest tube placed 1/6 intrapleural lytics  Interim History / Subjective:   A bit drowsy today. Family thinking its from oxycodone + trazodone he received last night.   Breathing comfortably, no distress.  Chest tube drained 762 mL yesterday after lytic dose #2  Objective   Blood pressure (!) 121/53, pulse 81, temperature (!) 97.5 F (36.4 C), temperature source Oral, resp. rate 16, height 6' (1.829 m), weight 76.7 kg, SpO2 97 %.        Intake/Output Summary (Last 24 hours) at 03/02/2022 1133 Last data filed at 03/02/2022 0700 Gross per 24 hour  Intake 739.99 ml  Output 1195 ml  Net -455.01 ml    Filed Weights   02/27/22 1400  Weight: 76.7 kg    Examination: Gen:      Frail elderly gentleman in NAD HEENT:  [place]/AT, PERRL Neck:     no JVD, no mass Lungs:    Clear bilateral breath sounds.  CV:         RRR, no MRG Abd:      Soft, NT, ND Ext:  No acute deformity or edema Skin:      Grossly intact.  Neuro: alert and oriented. Responses a bit sluggish.   Labs/imaging reviewed CXR with effusion unchanged from yesterday.    Resolved Hospital Problem list  Assessment & Plan:  Left empyema: strep intermedius resistant to erythromycin Streptococcus intermedius pneumonia Admitted 12/18-12/20 at [facility] with left-sided pneumonia on chest x-ray.  Frank pus pulled out on second chest tube insertion Continue antibiotic coverage per primary. Would consider narrowing to ceftriaxone.  Recommend 4 weeks total of antibiotic therapy TPA dornase # 3 today.  Will continue to monitor chest tube output and evaluate for discontinuation.   Acute Hypoxic Respiratory Failure secondary to LLL Atelectasis, Shunt Physiology  Wean down oxygen as tolerated   Best  Practice (right click and "Reselect all SmartList Selections" daily)   Per [facility]   Critical care time:  n/a    Georgann Housekeeper, AGACNP-BC [facility]  See Amion for personal pager PCCM on call pager [phone] until 7pm. Please call Elink 7p-7a. [phone]  03/02/2022 11:41 AM

## 2022-03-02 NOTE — Progress Notes (Signed)
OT Cancellation Note  Patient Details Name: BRONX BROGDEN MRN: [id number] DOB: 12/19/1933   Cancelled Treatment:    Reason Eval/Treat Not Completed: Patient not medically ready Nurse asking for therapy to hold off at this time. OT to continue to follow and check back as schedule will allow.  [name], MS Acute Rehabilitation Department Office# [phone]  03/02/2022, 12:55 PM

## 2022-03-02 NOTE — Progress Notes (Signed)
  Progress Note   Patient: David [name] JXB:[id number] DOB: Jan 05, 1934 DOA: 02/26/2022     4 DOS: the patient was seen and examined on 03/02/2022   Brief hospital course: 89 year old man presenting with cough and shortness of breath.  PMH recent hospitalization for pneumonia at [facility] who improved and then subsequently developed more shortness of breath 1/4, emergency department was found to be hypoxic requiring 4 L nasal cannula, significant leukocytosis.  Chest x-ray demonstrated left-sided infiltrate, CTA showed left-sided effusion and total atelectasis left lower lobe.  Seen by pulmonology and chest tube placed.  Consultants Pulmonology   Procedures 1/4 Chest Tube Insertion. Pt accidentally pulled out. 1/5 Chest Tube Insertion 1/6 Pleural Fibrinolysis Initial day  1/7 Pleural Fibrinolysis Second day  1/8 Pleural Fibrinolysis Third day  Assessment and Plan: Left empyema with marked leukocytosis  Pneumonia  Acute hypoxic respiratory failure Chest CT showed complete opacification left lower lobe, left lower lobe bronchus occlusion suspected, moderate debris in trachea Tachypnea resolved. HR now normal. O2 stable at 4L [place].  Day 3 of lytics today BC NGTD. Pleural fluid culture with Strep intermedius, sensitivity pending. Continue supplemental oxygen   CKD stage IIIa Baseline was 1.37 July 2019 Today 1.64, will check BMP to confirm   Essential hypertension Remains stable. Continue amlodipine   Normocytic anemia CBC  Goals of care.  Discussed in detail with patient, daughter, wife.  Patient wants to continue all current treatments but request DNR status.  His wife and daughter concur with this decision.  Orders updated.     Subjective:  Weak Feels ok Breathing ok  Physical Exam: Vitals:   03/02/22 0345 03/02/22 0743 03/02/22 0852 03/02/22 1301  BP: (!) 99/52  (!) 121/53 (!) 131/49  Pulse: 87  81 83  Resp: (!) '22  16 20  '$ Temp: 98 F (36.7 C)  (!) 97.5 F (36.4 C)  97.6 F (36.4 C)  TempSrc: Axillary  Oral Oral  SpO2: 99% 99% 97% 97%  Weight:      Height:       Physical Exam Vitals reviewed.  Constitutional:      General: He is not in acute distress.    Appearance: He is not ill-appearing or toxic-appearing.  Cardiovascular:     Rate and Rhythm: Normal rate and regular rhythm.     Heart sounds: No murmur heard. Pulmonary:     Effort: No respiratory distress.     Breath sounds: No wheezing or rales.     Comments: Mild increased respiratory effort. Decreased breath sounds on the left. Neurological:     Mental Status: He is alert.  Psychiatric:        Mood and Affect: Mood normal.        Behavior: Behavior normal.     Data Reviewed: Afebrile, normal HR Creatinine 1.64 BMP and CBC pending  Family Communication: wife and daughter at bedside  Disposition: Status is: Inpatient Remains inpatient appropriate because: empyema  Planned Discharge Destination:  TBD    Time spent: 35 minutes  Author: Murray Hodgkins, MD 03/02/2022 3:45 PM  For on call review [email or web address].

## 2022-03-02 NOTE — Progress Notes (Signed)
Pharmacy Antibiotic Note  David [name] is a 89 y.o. male admitted on 02/26/2022 with pneumonia.  Pharmacy initially consulted for vancomycin dosing; now asked to add Zosyn  Pt with chest tube placement on 1/4, concern for left empyema vs parapneumonic effusion. Chest CT showed complete opaicification of left lower lobe. Left lower lobe bronchus occulusion suspected with moderate debris in trachea.  CCM consulted, s/p pleural fibrinolysis 1/6-1/7.  Day #4 Vanc/Zosyn - Afebrile - WBC 38.4 (1/7) - SCr increased 1.64  Plan: Continue Zosyn 3.375gm IV q8h (4hr extended infusions) Adjust Vancomycin to '1750mg'$  IV q48h - next dose 1/9 at 18:00 Follow SCr daily and Vanc/Zosyn combo can increase risk of nephrotoxicity F/u Strep intermedius sensitivities and narrow abx ASAP  Height: 6' (182.9 cm) Weight: 76.7 kg (169 lb 1.5 oz) IBW/kg (Calculated) : 77.6  Temp (24hrs), Avg:97.9 F (36.6 C), Min:97.7 F (36.5 C), Max:98 F (36.7 C)  Recent Labs  Lab 02/26/22 1307 02/26/22 2122 02/27/22 0816 02/28/22 0541 03/01/22 0529 03/02/22 0547  WBC 44.5*  --  39.9*  --  38.4*  --   CREATININE 1.45*  --  1.20 1.07 1.22 1.64*  LATICACIDVEN 1.7 1.1  --   --   --   --      Estimated Creatinine Clearance: 35.1 mL/min (A) (by C-G formula based on SCr of 1.64 mg/dL (H)).    No Known Allergies  Antimicrobials this admission: 1/4 cefepime  x1  1/5 azithromycin >> 1/5 1/5 ceftriaxone >> 1/5 1/5 vancomycin >>  1/5 zosyn >>  Dose adjustments this admission: 1/8 decrease vancomycin from '1250mg'$  q24h to '1750mg'$  q48h for rising SCr  Microbiology results: 1/4 BCx: ngtd 1/4 UCx:  NGF 1/4 pleural fluid: rare strep intermedius 1/4 MRSA PCR: not detected 1/4 Legionella Ag:  IP 1/4 Strep pneumo Ag: neg 1/5 Pleural fluid (left): few strep intermedius  Thank you for allowing pharmacy to be a part of this patient's care.   Peggyann Juba, PharmD, BCPS Pharmacy: [phone] 03/02/2022 8:18 AM

## 2022-03-02 NOTE — Evaluation (Signed)
Physical Therapy Evaluation Patient Details Name: David [name] MRN: [id number] DOB: 09-08-1933 Today's Date: 03/02/2022  History of Present Illness  89 yo male admitted with Pna, L empyema. S/P placement chest tube (left). Hx of pulm HTN, CKD, OA.  Clinical Impression  Bed level eval only. Pt with flat affect with intermittent tearfulness. Poor eye contact with patient, at times, staring off. Low volume, and sometimes odd, responses. Pt did not follow 1 step commands consistently. Wife and daughter present during session. Both reports pt has had a decline since admission (physically and cognitively). Deferred mobility OOB at this time-RN aware-family prefers this. Will check back another time to reattempt OOB mobility (family reports pt has been OOB since admission).        Recommendations for follow up therapy are one component of a multi-disciplinary discharge planning process, led by the attending physician.  Recommendations may be updated based on patient status, additional functional criteria and insurance authorization.  Follow Up Recommendations  (HH vs SNF-depending on progress/family ability to provide care)      Assistance Recommended at Discharge Frequent or constant Supervision/Assistance  Patient can return home with the following  A lot of help with walking and/or transfers;A lot of help with bathing/dressing/bathroom;Help with stairs or ramp for entrance;Assist for transportation;Assistance with cooking/housework    Equipment Recommendations Rolling walker (2 wheels) (TBD)  Recommendations for Other Services       Functional Status Assessment Patient has had a recent decline in their functional status and demonstrates the ability to make significant improvements in function in a reasonable and predictable amount of time.     Precautions / Restrictions Precautions Precautions: Fall Precaution Comments: L chest tube Restrictions Weight Bearing Restrictions: No       Mobility  Bed Mobility               General bed mobility comments: NT-pt now following 1 step commands consistently, staring off, intermittent tearfulness, odd responses to questions.    Transfers                        Ambulation/Gait                  Stairs            Wheelchair Mobility    Modified Rankin (Stroke Patients Only)       Balance                                             Pertinent Vitals/Pain Pain Assessment Pain Assessment: Faces Faces Pain Scale: Hurts little more Pain Location: pt holding L side near chest tube placement Pain Intervention(s): Monitored during session    Home Living Family/patient expects to be discharged to:: Private residence Living Arrangements: Spouse/significant other Available Help at Discharge: Family Type of Home: House Home Access: Stairs to enter   Technical brewer of Steps: 1   Home Layout: One level Home Equipment: Cane - single point      Prior Function Prior Level of Function : Independent/Modified Independent             Mobility Comments: uses cane PRN       Hand Dominance        Extremity/Trunk Assessment   Upper Extremity Assessment Upper Extremity Assessment: Defer to OT evaluation (did not raise his arms on  command. weak grip on command)    Lower Extremity Assessment Lower Extremity Assessment: Generalized weakness (able to pump his ankles but required repeated cueing and encouragement to perform beyone a few reps)       Communication   Communication: No difficulties  Cognition Arousal/Alertness: Awake/alert (but drowsy) Behavior During Therapy: Flat affect Overall Cognitive Status: Impaired/Different from baseline Area of Impairment: Orientation, Following commands, Problem solving                       Following Commands: Follows one step commands inconsistently     Problem Solving: Slow processing, Decreased  initiation, Requires verbal cues General Comments: intermittent teafulness; poor eye contact during session (sometimes staring off); low volume speech-at times mumbles. able to state he was in the hospital and his birthdate        General Comments      Exercises     Assessment/Plan    PT Assessment Patient needs continued PT services  PT Problem List Decreased strength;Decreased activity tolerance;Decreased balance;Decreased mobility;Decreased cognition;Decreased safety awareness;Decreased knowledge of use of DME;Pain       PT Treatment Interventions DME instruction;Gait training;Therapeutic exercise;Balance training;Functional mobility training;Therapeutic activities;Patient/family education    PT Goals (Current goals can be found in the Care Plan section)  Acute Rehab PT Goals Patient Stated Goal: pt unable to state. family would like patient to be able to return home PT Goal Formulation: With family Time For Goal Achievement: 03/16/22 Potential to Achieve Goals: Good    Frequency Min 3X/week     Co-evaluation               AM-PAC PT "6 Clicks" Mobility  Outcome Measure Help needed turning from your back to your side while in a flat bed without using bedrails?: Total Help needed moving from lying on your back to sitting on the side of a flat bed without using bedrails?: Total Help needed moving to and from a bed to a chair (including a wheelchair)?: Total Help needed standing up from a chair using your arms (e.g., wheelchair or bedside chair)?: Total Help needed to walk in hospital room?: Total Help needed climbing 3-5 steps with a railing? : Total 6 Click Score: 6    End of Session   Activity Tolerance:  (limited by cognition) Patient left: in bed;with call bell/phone within reach;with bed alarm set;with family/visitor present   PT Visit Diagnosis: Muscle weakness (generalized) (M62.81);Difficulty in walking, not elsewhere classified (R26.2)    Time:  1165-7903 PT Time Calculation (min) (ACUTE ONLY): 23 min   Charges:   PT Evaluation $PT Eval Low Complexity: [place], PT Acute Rehabilitation  Office: [phone]

## 2022-03-03 ENCOUNTER — Inpatient Hospital Stay (HOSPITAL_COMMUNITY): Payer: Medicare Other

## 2022-03-03 DIAGNOSIS — N1831 Chronic kidney disease, stage 3a: Secondary | ICD-10-CM | POA: Diagnosis not present

## 2022-03-03 DIAGNOSIS — J869 Pyothorax without fistula: Secondary | ICD-10-CM | POA: Diagnosis not present

## 2022-03-03 DIAGNOSIS — J189 Pneumonia, unspecified organism: Secondary | ICD-10-CM | POA: Diagnosis not present

## 2022-03-03 DIAGNOSIS — J9601 Acute respiratory failure with hypoxia: Secondary | ICD-10-CM | POA: Diagnosis not present

## 2022-03-03 LAB — CULTURE, BLOOD (ROUTINE X 2)
Culture: NO GROWTH
Culture: NO GROWTH
Special Requests: ADEQUATE
Special Requests: ADEQUATE

## 2022-03-03 LAB — CBC
HCT: 31.2 % — ABNORMAL LOW (ref 39.0–52.0)
Hemoglobin: 10.1 g/dL — ABNORMAL LOW (ref 13.0–17.0)
MCH: 27.7 pg (ref 26.0–34.0)
MCHC: 32.4 g/dL (ref 30.0–36.0)
MCV: 85.5 fL (ref 80.0–100.0)
Platelets: 522 10*3/uL — ABNORMAL HIGH (ref 150–400)
RBC: 3.65 MIL/uL — ABNORMAL LOW (ref 4.22–5.81)
RDW: 15.7 % — ABNORMAL HIGH (ref 11.5–15.5)
WBC: 21.3 10*3/uL — ABNORMAL HIGH (ref 4.0–10.5)
nRBC: 0 % (ref 0.0–0.2)

## 2022-03-03 LAB — BASIC METABOLIC PANEL
Anion gap: 8 (ref 5–15)
BUN: 31 mg/dL — ABNORMAL HIGH (ref 8–23)
CO2: 26 mmol/L (ref 22–32)
Calcium: 8.7 mg/dL — ABNORMAL LOW (ref 8.9–10.3)
Chloride: 104 mmol/L (ref 98–111)
Creatinine, Ser: 1.3 mg/dL — ABNORMAL HIGH (ref 0.61–1.24)
GFR, Estimated: 54 mL/min — ABNORMAL LOW (ref >60)
Glucose, Bld: 105 mg/dL — ABNORMAL HIGH (ref 70–99)
Potassium: 3.1 mmol/L — ABNORMAL LOW (ref 3.5–5.1)
Sodium: 138 mmol/L (ref 135–145)

## 2022-03-03 LAB — LEGIONELLA PNEUMOPHILA SEROGP 1 UR AG: L. pneumophila Serogp 1 Ur Ag: NEGATIVE

## 2022-03-03 MED ORDER — POTASSIUM CHLORIDE CRYS ER 20 MEQ PO TBCR
40.0000 meq | EXTENDED_RELEASE_TABLET | Freq: Once | ORAL | Status: AC
  Administered 2022-03-03: 40 meq via ORAL
  Filled 2022-03-03: qty 2

## 2022-03-03 MED ORDER — LORAZEPAM 0.5 MG PO TABS
0.5000 mg | ORAL_TABLET | Freq: Every evening | ORAL | Status: DC | PRN
  Administered 2022-03-03: 0.5 mg via ORAL
  Filled 2022-03-03: qty 1

## 2022-03-03 NOTE — Plan of Care (Signed)
PCCM Plan of Care Note  Tube functioning, residual small effusion on CXR, will check CT Chest to determine if we give another dose of lytics vs instead remove tube and treat with long course ABX vs worst case needs another tube.  [place]

## 2022-03-03 NOTE — Progress Notes (Addendum)
Progress Note   Patient: David [name]:[id number] DOB: September 13, 1933 DOA: 02/26/2022     5 DOS: the patient was seen and examined on 03/03/2022   Brief hospital course: 89 year old man presenting with cough and shortness of breath.  PMH recent hospitalization for pneumonia at [facility] who improved and then subsequently developed more shortness of breath 1/4, emergency department was found to be hypoxic requiring 4 L nasal cannula, significant leukocytosis.  Chest x-ray demonstrated left-sided infiltrate, CTA showed left-sided effusion and total atelectasis left lower lobe.  Seen by pulmonology and chest tube placed.  Treated with fibrinolytics with improvement.  Consultants Pulmonology   Procedures 1/4 Chest Tube Insertion. Pt accidentally pulled out. 1/5 Chest Tube Insertion 1/6 Pleural Fibrinolysis Initial day  1/7 Pleural Fibrinolysis Second day  1/8 Pleural Fibrinolysis Third day. Prognosis guarded. Clinically worse. Code status changed to DNR.  Assessment and Plan: Left empyema with marked leukocytosis, LLL pneumonia Pneumonia  Acute hypoxic respiratory failure Initial chest CT showed complete opacification left lower lobe, left lower lobe bronchus occlusion suspected, moderate debris in trachea O2 stable at 4L [employer].  s/p 3 days of lytics 1/8. Continue PCN, management of chest tube per pulmonology. BC NG/F. Pleural fluid culture with Strep intermedius,  Continue supplemental oxygen, wean as tolerated Repeat chest CT showed marked reduction, effusion, persistent airspace disease left lower lobe, increase in size of loculated fissural fluid left mid chest   CKD stage IIIa Baseline was 1.37 July 2019 Up to 1.64 yesterday, back down to 1.30 today BMP in AM  Essential hypertension Remains stable. Continue amlodipine   Normocytic anemia CBC   Severe malnutrition in context of acute illness/injury Ensure Plus High Protein   Magic cup TID with meals Multivitamin with minerals  daily  Goals of care Last addressed 1/8 w/ patient, wife, daughter. DNR. Continue medical treatments.   RIGHT upper lobe pulmonary nodule measuring 6 mm.  Non-contrast chest CT at 6-12 months is recommended. If the nodule is stable at time of repeat CT, then future CT at 18-24 months (from today's scan) is considered optional for low-risk patients, but is recommended for high-risk patients.  Clinically appears much better today.     Subjective:  Feels poorly Not eating much "I know I need to eat"  Physical Exam: Vitals:   03/02/22 2115 03/03/22 0801 03/03/22 1200 03/03/22 1548  BP: (!) 138/52 (!) 147/48  (!) 168/59  Pulse: 79 64  70  Resp: '20 15 19 18  '$ Temp: 98.2 F (36.8 C) 98.7 F (37.1 C)  98.5 F (36.9 C)  TempSrc: Oral Oral  Oral  SpO2: 97% 100%  100%  Weight:      Height:       Physical Exam Vitals reviewed.  Constitutional:      General: He is not in acute distress.    Appearance: He is not ill-appearing or toxic-appearing.     Comments: Appears better, more vigor.   Cardiovascular:     Rate and Rhythm: Normal rate and regular rhythm.     Heart sounds: No murmur heard. Pulmonary:     Effort: Pulmonary effort is normal. No respiratory distress.     Breath sounds: No wheezing, rhonchi or rales.  Neurological:     Mental Status: He is alert.  Psychiatric:        Mood and Affect: Mood normal.        Behavior: Behavior normal.     Data Reviewed: O2 stable on 4L K+ 3.1 Creatinine down  to 1.3 BUN 31 WBC down to 21.3 CT chest noted  Family Communication: son at bedside  Disposition: Status is: Inpatient Remains inpatient appropriate because: empyema  Planned Discharge Destination:  TBD    Time spent: 25 minutes  Author: Murray Hodgkins, MD 03/03/2022 5:37 PM  For on call review [email or web address].

## 2022-03-03 NOTE — Progress Notes (Signed)
OT Cancellation Note  Patient Details Name: David [name] MRN: [id number] DOB: 12-09-1933   Cancelled Treatment:    Reason Eval/Treat Not Completed: Patient at procedure or test/ unavailable Patient attempted to see earlier with patient just back to bed after being up with nursing. Attempted to start session at this time with CT transport arriving to take patient downstairs. OT to continue to follow and check back as schedule will allow [name], MS Acute Rehabilitation Department Office# [phone]  03/03/2022, 3:13 PM

## 2022-03-04 ENCOUNTER — Telehealth: Payer: Self-pay | Admitting: Student

## 2022-03-04 DIAGNOSIS — J9601 Acute respiratory failure with hypoxia: Secondary | ICD-10-CM | POA: Diagnosis not present

## 2022-03-04 DIAGNOSIS — J189 Pneumonia, unspecified organism: Secondary | ICD-10-CM | POA: Diagnosis not present

## 2022-03-04 DIAGNOSIS — J869 Pyothorax without fistula: Secondary | ICD-10-CM | POA: Diagnosis not present

## 2022-03-04 DIAGNOSIS — J90 Pleural effusion, not elsewhere classified: Secondary | ICD-10-CM | POA: Diagnosis not present

## 2022-03-04 LAB — BASIC METABOLIC PANEL
Anion gap: 8 (ref 5–15)
BUN: 21 mg/dL (ref 8–23)
CO2: 27 mmol/L (ref 22–32)
Calcium: 8.8 mg/dL — ABNORMAL LOW (ref 8.9–10.3)
Chloride: 102 mmol/L (ref 98–111)
Creatinine, Ser: 0.96 mg/dL (ref 0.61–1.24)
GFR, Estimated: >60 mL/min (ref >60)
Glucose, Bld: 95 mg/dL (ref 70–99)
Potassium: 3.4 mmol/L — ABNORMAL LOW (ref 3.5–5.1)
Sodium: 137 mmol/L (ref 135–145)

## 2022-03-04 LAB — CBC
HCT: 32.7 % — ABNORMAL LOW (ref 39.0–52.0)
Hemoglobin: 10.4 g/dL — ABNORMAL LOW (ref 13.0–17.0)
MCH: 26.9 pg (ref 26.0–34.0)
MCHC: 31.8 g/dL (ref 30.0–36.0)
MCV: 84.7 fL (ref 80.0–100.0)
Platelets: 518 10*3/uL — ABNORMAL HIGH (ref 150–400)
RBC: 3.86 MIL/uL — ABNORMAL LOW (ref 4.22–5.81)
RDW: 15.4 % (ref 11.5–15.5)
WBC: 19.7 10*3/uL — ABNORMAL HIGH (ref 4.0–10.5)
nRBC: 0 % (ref 0.0–0.2)

## 2022-03-04 MED ORDER — PENICILLIN G POTASSIUM 20000000 UNITS IJ SOLR
12.0000 10*6.[IU] | Freq: Two times a day (BID) | INTRAVENOUS | Status: DC
  Administered 2022-03-04 – 2022-03-09 (×10): 12 10*6.[IU] via INTRAVENOUS
  Filled 2022-03-04 (×11): qty 12

## 2022-03-04 MED ORDER — POTASSIUM CHLORIDE CRYS ER 20 MEQ PO TBCR
40.0000 meq | EXTENDED_RELEASE_TABLET | Freq: Once | ORAL | Status: DC
  Filled 2022-03-04: qty 2

## 2022-03-04 MED ORDER — OLANZAPINE 10 MG IM SOLR
2.0000 mg | Freq: Once | INTRAMUSCULAR | Status: AC
  Administered 2022-03-04: 2 mg via INTRAMUSCULAR
  Filled 2022-03-04: qty 10

## 2022-03-04 MED ORDER — OLANZAPINE 5 MG PO TABS
2.5000 mg | ORAL_TABLET | Freq: Every day | ORAL | Status: DC
  Administered 2022-03-04: 2.5 mg via ORAL
  Filled 2022-03-04: qty 1

## 2022-03-04 MED ORDER — STERILE WATER FOR INJECTION IJ SOLN
INTRAMUSCULAR | Status: AC
  Filled 2022-03-04: qty 10

## 2022-03-04 NOTE — Progress Notes (Signed)
PHARMACY NOTE:  ANTIMICROBIAL RENAL DOSAGE ADJUSTMENT  Current antimicrobial regimen includes a mismatch between antimicrobial dosage and estimated renal function.  As per policy approved by the Pharmacy & Therapeutics and Medical Executive Committees, the antimicrobial dosage will be adjusted accordingly.  Current antimicrobial dosage:  penicillin 4 million units q6h  Indication: strep empyema  Renal Function:  Estimated Creatinine Clearance: 59.9 mL/min (by C-G formula based on SCr of 0.96 mg/dL). '[]'$      On intermittent HD, scheduled: '[]'$      On CRRT    Antimicrobial dosage has been changed to:  24 million units/24hr continuous infusion  Additional comments:   Thank you for allowing pharmacy to be a part of this patient's care.  Peggyann Juba, PharmD, BCPS Pharmacy: [phone] 03/04/2022 12:54 PM

## 2022-03-04 NOTE — Progress Notes (Signed)
NAME:  David [name], MRN:  [id number], DOB:  07-04-1933, LOS: 6 ADMISSION DATE:  02/26/2022, CONSULTATION DATE:  02/26/22 REFERRING MD:  Dr. Marylyn Ishihara, CHIEF COMPLAINT:  SOB   History of Present Illness:  89 y/o M who presented to [facility] on 1/4 with reports of shortness of breath.   The patient was recently admitted to [facility] from 12/18-12/20/2023 with working diagnosis of community acquired PNA.  He was discharged on Augmentin, Azithromycin and codeine cough syrup.  He followed up in primary care clinic on 12/29 and reported at that time that his symptoms had largely resolved short of residual cough.  He was given additional cough syrup Rx at that visit. His blood pressures were noted to have been elevated - he was taken off his blood pressure medications while in hospital for hypotension. Subsequently, he was seen for blood pressure medication adjustment.   He returned 1/4 to his PCP with reports of ongoing SOB. He reported difficulty walking to the bathroom. He reported a wet cough & difficulty catching his breath.  He was hypoxic in the clinic with room air saturations of 91% prompting ER referral via EMS.    In the ER, patient noted to require 4 L nasal cannula to main saturations.  Significant leukocytosis.  Creatinine appears at baseline.  BUN elevated.  Chest x-ray demonstrated left-sided infiltrate with what appears to be loculated pleural effusion on my review and interpretation.  Subsequent CTA PE protocol demonstrates no PE but loculated left-sided effusion and total atelectasis of the left lower lobe on my review and interpretation.  He was given antibiotics.  PCCM consulted for loculated pleural effusion.  Chest tube was placed in the emergency room by PCCM team.  Pertinent  Medical History  Hypertension  HLD CKD IIIa Pulmonary Hypertension  Cataracts  Arthritis  Renal Calculi   Significant Hospital Events: Including procedures, antibiotic start and stop dates in addition  to other pertinent events   1/4 Admit with SOB, loculated effusion noted on CT imaging. CT placed but with ~ 400 ml returned post placement.  Pt pulled chest tube out overnight.  1/5 Patient pulled chest tube out overnight and new chest tube placed 1/6 intrapleural lytics  Interim History / Subjective:   No acute issues   50cc drainage overnight  Objective   Blood pressure (!) 157/62, pulse 75, temperature 98.7 F (37.1 C), temperature source Oral, resp. rate 20, height 6' (1.829 m), weight 76.7 kg, SpO2 94 %.        Intake/Output Summary (Last 24 hours) at 03/04/2022 1554 Last data filed at 03/04/2022 1257 Gross per 24 hour  Intake 130 ml  Output 262 ml  Net -132 ml   Filed Weights   02/27/22 1400  Weight: 76.7 kg    Examination: Gen:      Frail elderly gentleman in NAD HEENT:  [place]/AT, PERRL Neck:     no JVD, no mass Lungs:    diminished bl CV:         RRR Abd:      Soft, NT, ND Ext:  No acute deformity or edema Neuro:  inattentive  Labs/imaging reviewed CT Chest with decreased size of empyema, RUL 78m nodule   Resolved Hospital Problem list      Assessment & Plan:  Left empyema: strep intermedius resistant to erythromycin Streptococcus intermedius pneumonia Chest tube will be removed today Would continue ABX with augmentin as outpatient for 4 week total course Will ensure he has pulmonary clinic  follow up with repeat CXR  RUL nodule Consideration of 6 month CT as outpatient which can be arranged in clinic  Acute Hypoxic Respiratory Failure secondary to LLL Atelectasis, Shunt Physiology  Wean down oxygen as tolerated Walking oximetry prior to discharge   Will sign off glad to be reinvolved as condition changes   Best Practice (right click and "Reselect all SmartList Selections" daily)   Per [place]   Critical care time:  [place] for personal pager PCCM on call pager [phone] until  7pm. Please call Elink 7p-7a. [phone]  03/04/2022 3:54 PM

## 2022-03-04 NOTE — Progress Notes (Signed)
Patient removed his IV. He is unable to follow direction by staff to remain in bed, attempts to exit multiple times, daughter at bedside and agreeable to have soft wrist restraints for medical necessity when I explained the importance of him not removing his chest tube among other things. NP Gershon Cull placed order.

## 2022-03-04 NOTE — Evaluation (Signed)
Occupational Therapy Evaluation Patient Details Name: David [name] MRN: [id number] DOB: Oct 14, 1933 Today's Date: 03/04/2022   History of Present Illness Patient is a 89 year old male who presented with SOB and cough. Patient was found to have L side effusion, left empyema with marked leukocytosis, pneumonia, and acute hypoxic respiratory failure. Patient had chest tube insertion on 1/4. 1/6 and 1/7 pleural fibrinolysis. PMH: CKD III, HTN, anemia.   Clinical Impression   Patient is a 89 year old male who was admitted for above. Patient was living at home with wife independently prior level. Currently, patient is TD for ADL tasks with increased weakness, decreased functional activity tolerance, decreased safety awareness, decreased cognitive processing impacting participation in ADLs.  Patient would continue to benefit from skilled OT services at this time while admitted and after d/c to address noted deficits in order to improve overall safety and independence in ADLs.       Recommendations for follow up therapy are one component of a multi-disciplinary discharge planning process, led by the attending physician.  Recommendations may be updated based on patient status, additional functional criteria and insurance authorization.   Follow Up Recommendations  Home health OT     Assistance Recommended at Discharge Frequent or constant Supervision/Assistance  Patient can return home with the following A lot of help with bathing/dressing/bathroom;A lot of help with walking and/or transfers;Assistance with feeding;Direct supervision/assist for financial management;Help with stairs or ramp for entrance;Direct supervision/assist for medications management;Assistance with cooking/housework;Assist for transportation    Functional Status Assessment  Patient has had a recent decline in their functional status and demonstrates the ability to make significant improvements in function in a reasonable and  predictable amount of time.  Equipment Recommendations  BSC/3in1    Recommendations for Other Services       Precautions / Restrictions Precautions Precautions: Fall Precaution Comments: L chest tube Restrictions Weight Bearing Restrictions: No      Mobility Bed Mobility Overal bed mobility: Needs Assistance Bed Mobility: Supine to Sit     Supine to sit: Min assist, +2 for safety/equipment     General bed mobility comments: with TD to mange lines and keep patient from pulling on them         Balance Overall balance assessment: Mild deficits observed, not formally tested               ADL either performed or assessed with clinical judgement   ADL Overall ADL's : Needs assistance/impaired Eating/Feeding: Maximal assistance;Bed level   Grooming: Maximal assistance;Sitting Grooming Details (indicate cue type and reason): in reclienr attempted to get patient to brush teeth with patient unable to attend to task. Upper Body Bathing: Sitting;Total assistance   Lower Body Bathing: Sitting/lateral leans;Total assistance   Upper Body Dressing : Sitting;Total assistance   Lower Body Dressing: Sitting/lateral leans;Total assistance   Toilet Transfer: +2 for physical assistance;+2 for safety/equipment;Minimal assistance Toilet Transfer Details (indicate cue type and reason): with consistent cues for safety. Toileting- Clothing Manipulation and Hygiene: Total assistance;Sit to/from stand               Vision   Additional Comments: was noted to have some hallucinations during session nurse made aware.            Pertinent Vitals/Pain Pain Assessment Pain Assessment: Faces Faces Pain Scale: No hurt     Hand Dominance     Extremity/Trunk Assessment Upper Extremity Assessment Upper Extremity Assessment: Difficult to assess due to impaired cognition (strength in hands  was good. full ROM not tested with chest tube in place)   Lower Extremity  Assessment Lower Extremity Assessment: Defer to PT evaluation   Cervical / Trunk Assessment Cervical / Trunk Assessment: Kyphotic   Communication Communication Communication: No difficulties   Cognition Arousal/Alertness: Awake/alert Behavior During Therapy: Flat affect Overall Cognitive Status: Impaired/Different from baseline Area of Impairment: Orientation, Following commands, Problem solving                       Following Commands: Follows one step commands inconsistently     Problem Solving: Slow processing, Decreased initiation, Requires verbal cues General Comments: patient was noted to report there was a dog sitting in the room or a girl, hard to get more information on what he was seeing. patient needed increased and consistent cues for participation in all tasks.                Home Living Family/patient expects to be discharged to:: Private residence Living Arrangements: Spouse/significant other Available Help at Discharge: Family;Available 24 hours/day Type of Home: House Home Access: Stairs to enter [employer] of Steps: 1   Home Layout: One level               Home Equipment: Cane - single point          Prior Functioning/Environment Prior Level of Function : Independent/Modified Independent             Mobility Comments: uses cane PRN          OT Problem List: Decreased activity tolerance;Decreased cognition;Decreased knowledge of use of DME or AE;Cardiopulmonary status limiting activity;Impaired UE functional use;Decreased safety awareness;Decreased knowledge of precautions;Impaired balance (sitting and/or standing)      OT Treatment/Interventions: Self-care/ADL training;Energy conservation;Therapeutic exercise;DME and/or AE instruction;Therapeutic activities;Patient/family education;Balance training    OT Goals(Current goals can be found in the care plan section) Acute Rehab OT Goals Patient Stated Goal: none  stated OT Goal Formulation: With family Time For Goal Achievement: 03/18/22 Potential to Achieve Goals: Fair  OT Frequency: Min 2X/week    Co-evaluation PT/OT/SLP Co-Evaluation/Treatment: Yes Reason for Co-Treatment: To address functional/ADL transfers;Necessary to address cognition/behavior during functional activity PT goals addressed during session: Mobility/safety with mobility OT goals addressed during session: ADL's and self-care      AM-PAC OT "6 Clicks" Daily Activity     Outcome Measure Help from another person eating meals?: A Lot Help from another person taking care of personal grooming?: A Lot Help from another person toileting, which includes using toliet, bedpan, or urinal?: Total Help from another person bathing (including washing, rinsing, drying)?: Total Help from another person to put on and taking off regular upper body clothing?: Total Help from another person to put on and taking off regular lower body clothing?: Total 6 Click Score: 8   End of Session Equipment Utilized During Treatment: Gait belt;Rolling walker (2 wheels) (gait belt up as high as possible under BUE) Nurse Communication: Other (comment) (ok to participate in session, updated on session afterwards)  Activity Tolerance: Patient tolerated treatment well;Patient limited by fatigue Patient left: in chair;with call bell/phone within reach;with chair alarm set;with family/visitor present  OT Visit Diagnosis: Unsteadiness on feet (R26.81);Other abnormalities of gait and mobility (R26.89);Muscle weakness (generalized) (M62.81)                Time: 6979-4801 OT Time Calculation (min): 39 min Charges:  OT General Charges $OT Visit: 1 Visit OT Evaluation $OT Eval Moderate Complexity:  1 Mod  [name], MS Acute Rehabilitation Department Office# [phone]   Willa Rough 03/04/2022, 11:09 AM

## 2022-03-04 NOTE — Progress Notes (Signed)
[facility]                                                                              David [name], is a 89 y.o. male, DOB - [birth year], TDD:[id number] Admit date - 02/26/2022    Outpatient Primary MD for the patient is [name], MD  LOS - 6  days  Chief Complaint  Patient presents with   Code Sepsis       Brief summary   Patient is a 88 year old male with CKD stage IIIa, HTN, anemia, presented with cough and shortness of breath.  Recent hospitalization for pneumonia at [facility], improved and subsequently developed more shortness of breath on 1/4.  Was found to be hypoxic in ED requiring 4 L O2 I Chest x-ray demonstrated left-sided infiltrate, CTA showed left-sided effusion and total atelectasis left lower lobe. Seen by pulmonology and chest tube placed. Treated with fibrinolytics with improvement.   Procedures 1/4 Chest Tube Insertion. Pt accidentally pulled out. 1/5 Chest Tube Insertion 1/6 Pleural Fibrinolysis Initial day  1/7 Pleural Fibrinolysis Second day  1/8 Pleural Fibrinolysis Third day. Prognosis guarded. Clinically worse. Code status changed to DNR.  Assessment & Plan    Principal Problem: Acute hypoxic respiratory failure with community acquired pneumonia of left lower lobe of lung Left empyema -Initial chest CT showed complete opacification of the left lower lobe, left lower lobe bronchus occlusion, moderate debris's in trachea -PCCM was consulted, status post 3 days of lytics, completed 1/8 -Pleural fluid culture with strep intermedius, currently on IV penicillin.  Blood cultures negative -This morning off O2, O2 sats 94 to 97% on room air -Repeat chest CT showed marked reduction in effusion, persistent airspace disease left lower lobe, increase in size of loculated fissural fluid in the left mid chest. -Leukocytosis improving -Management per CCM  Active Problems: Acute metabolic encephalopathy, likely has underlying dementia with  sundowning -Overnight received Ativan 0.5 mg for agitation which resulted in worse delirium likely due to paradoxical effect and needed restraints.  Subsequently received IM Zyprexa with improvement this morning.  Per daughter at the bedside, he appears to have significantly improved and eating better this morning. -Per daughter at the bedside, patient has history of sundowning and agitation in the evenings.  Will place on low-dose Zyprexa at bedtime. -Avoid ativan  Acute on stage 3a chronic kidney disease (CKD) (HCC) -Baseline creatinine 1.3 in 08/2017 -Worsened to 1.6 on 03/02/2022 however now improved to 0.9     HTN (hypertension) -BP stable, continue amlodipine    Normocytic anemia -H&H stable  Hypokalemia -Replaced    Protein-calorie malnutrition, severe Nutrition Problem: Severe Malnutrition Etiology: acute illness (CAP) Signs/Symptoms: moderate fat depletion, severe muscle depletion, energy intake < or equal to 50% for > or equal to 1 month, percent weight loss Interventions: Ensure Enlive (each supplement provides 350kcal and 20 grams of protein), Magic cup, MVI  Estimated body mass index is 22.93 kg/m as calculated from the following:   Height as of this encounter: 6' (1.829 m).   Weight as of this encounter: 76.7 kg.  Code Status: DNR DVT Prophylaxis:  SCDs Start: 02/27/22 0402  Level of Care: Level of care: Progressive Family Communication: Updated patient's daughter at the bedside   Disposition Plan:      Remains inpatient appropriate: Still with chest tube, not ready for discharge   Procedures:  Chest tube  Consultants:   PCCM  Antimicrobials:   Anti-infectives (From admission, onward)    Start     Dose/Rate Route Frequency Ordered Stop   03/03/22 1800  vancomycin (VANCOREADY) IVPB 1750 mg/350 mL  Status:  Discontinued        1,750 mg 175 mL/hr over 120 Minutes Intravenous Every 48 hours 03/02/22 0823 03/02/22 1111   03/02/22 1800  penicillin G  potassium 4 Million Units in dextrose 5 % 250 mL IVPB        4 Million Units 250 mL/hr over 60 Minutes Intravenous Every 6 hours 03/02/22 1113     02/27/22 1800  piperacillin-tazobactam (ZOSYN) IVPB 3.375 g  Status:  Discontinued        3.375 g 12.5 mL/hr over 240 Minutes Intravenous Every 8 hours 02/27/22 1645 03/02/22 1111   02/27/22 1600  vancomycin (VANCOREADY) IVPB 1250 mg/250 mL  Status:  Discontinued        1,250 mg 166.7 mL/hr over 90 Minutes Intravenous Every 24 hours 02/27/22 1133 03/02/22 0823   02/27/22 1200  cefTRIAXone (ROCEPHIN) 2 g in sodium chloride 0.9 % 100 mL IVPB  Status:  Discontinued        2 g 200 mL/hr over 30 Minutes Intravenous Every 24 hours 02/27/22 1103 02/27/22 1639   02/27/22 1200  azithromycin (ZITHROMAX) 500 mg in sodium chloride 0.9 % 250 mL IVPB  Status:  Discontinued        500 mg 250 mL/hr over 60 Minutes Intravenous Every 24 hours 02/27/22 1103 02/27/22 1639   02/27/22 1115  vancomycin (VANCOREADY) IVPB 1500 mg/300 mL  Status:  Discontinued        1,500 mg 150 mL/hr over 120 Minutes Intravenous  Once 02/27/22 1112 02/27/22 1112   02/26/22 1500  vancomycin (VANCOREADY) IVPB 1500 mg/300 mL        1,500 mg 150 mL/hr over 120 Minutes Intravenous  Once 02/26/22 1457 02/26/22 1850   02/26/22 1445  vancomycin (VANCOCIN) IVPB 1000 mg/200 mL premix  Status:  Discontinued        1,000 mg 200 mL/hr over 60 Minutes Intravenous  Once 02/26/22 1438 02/26/22 1457   02/26/22 1445  ceFEPIme (MAXIPIME) 2 g in sodium chloride 0.9 % 100 mL IVPB        2 g 200 mL/hr over 30 Minutes Intravenous  Once 02/26/22 1438 02/26/22 1745   02/26/22 1400  cefTRIAXone (ROCEPHIN) 1 g in sodium chloride 0.9 % 100 mL IVPB  Status:  Discontinued        1 g 200 mL/hr over 30 Minutes Intravenous  Once 02/26/22 1355 02/26/22 1435   02/26/22 1400  azithromycin (ZITHROMAX) 500 mg in sodium chloride 0.9 % 250 mL IVPB  Status:  Discontinued        500 mg 250 mL/hr over 60 Minutes  Intravenous  Once 02/26/22 1355 02/26/22 1435          Medications  amLODipine  10 mg Oral Daily   aspirin EC  81 mg Oral Daily   feeding supplement  237 mL Oral TID BM   levalbuterol  0.63 mg Nebulization BID   multivitamin with minerals  1 tablet Oral Daily   mouth rinse  15 mL Mouth Rinse 4 times per day  sodium chloride flush  10 mL Intrapleural Q8H   zinc sulfate  220 mg Oral Daily      Subjective:   David [name] was seen and examined today.  Overnight events noted, was agitated and was given 0.5 mg Ativan with paradoxical effect.  Subsequently noted to be more agitated and was placed on restraints.  This morning much more alert, oriented to self and person.  Eating better, daughter at the bedside.  No fevers  Objective:   Vitals:   03/03/22 2046 03/03/22 2107 03/04/22 0052 03/04/22 0756  BP:  (!) 153/56 (!) 152/55   Pulse:  67 63   Resp:  17 20   Temp:  98.2 F (36.8 C) 98.7 F (37.1 C)   TempSrc:  Oral Oral   SpO2: 97% 98% 96% 97%  Weight:      Height:        Intake/Output Summary (Last 24 hours) at 03/04/2022 1128 Last data filed at 03/04/2022 0900 Gross per 24 hour  Intake 690 ml  Output 642 ml  Net 48 ml     Wt Readings from Last 3 Encounters:  02/27/22 76.7 kg  09/09/17 93 kg  11/21/16 88.5 kg     Exam General: Alert and oriented x self and person, knows he is in [place] Cardiovascular: S1 S2 auscultated,  RRR Respiratory: Decreased breath sound at the bases Gastrointestinal: Soft, nontender, nondistended, + bowel sounds Ext: no pedal edema bilaterally Neuro: Strength 5/5 upper and lower extremities bilaterally Psych: Normal affect and demeanor, alert and oriented x3     Data Reviewed:  I have personally reviewed following labs    CBC Lab Results  Component Value Date   WBC 19.7 (H) 03/04/2022   RBC 3.86 (L) 03/04/2022   HGB 10.4 (L) 03/04/2022   HCT 32.7 (L) 03/04/2022   MCV 84.7 03/04/2022   MCH 26.9 03/04/2022   PLT 518  (H) 03/04/2022   MCHC 31.8 03/04/2022   RDW 15.4 03/04/2022   LYMPHSABS 0.3 (L) 02/26/2022   MONOABS 2.7 (H) 02/26/2022   EOSABS 0.0 02/26/2022   BASOSABS 0.1 00/37/0488     Last metabolic panel Lab Results  Component Value Date   NA 137 03/04/2022   K 3.4 (L) 03/04/2022   CL 102 03/04/2022   CO2 27 03/04/2022   BUN 21 03/04/2022   CREATININE 0.96 03/04/2022   GLUCOSE 95 03/04/2022   GFRNONAA >60 03/04/2022   GFRAA 54 (L) 09/09/2017   CALCIUM 8.8 (L) 03/04/2022   PROT 6.0 (L) 02/27/2022   ALBUMIN 2.5 (L) 02/27/2022   BILITOT 0.6 02/27/2022   ALKPHOS 83 02/27/2022   AST 40 02/27/2022   ALT 32 02/27/2022   ANIONGAP 8 03/04/2022    CBG (last 3)  No results for input(s): "GLUCAP" in the last 72 hours.    Coagulation Profile: Recent Labs  Lab 02/26/22 1307  INR 1.8*     Radiology Studies: I have personally reviewed the imaging studies  CT CHEST WO CONTRAST  Result Date: 03/03/2022 CLINICAL DATA:  89 year old male presents for evaluation of pneumonia. EXAM: CT CHEST WITHOUT CONTRAST TECHNIQUE: Multidetector CT imaging of the chest was performed following the standard protocol without IV contrast. RADIATION DOSE REDUCTION: This exam was performed according to the departmental dose-optimization program which includes automated exposure control, adjustment of the mA and/or kV according to patient size and/or use of iterative reconstruction technique. COMPARISON:  February 26, 2022. FINDINGS: Cardiovascular: Calcified aortic atherosclerosis similar to prior imaging. Heart size is  stable and moderately enlarged with small but unchanged pericardial effusion. Calcified coronary artery disease, RIGHT and LEFT coronary circulation. Dense aortic valvular calcifications. Central pulmonary vasculature is normal caliber. Limited assessment of cardiovascular structures given lack of intravenous contrast. Mediastinum/Nodes: No thoracic inlet, axillary, mediastinal or hilar adenopathy.  Esophagus grossly normal. Lungs/Pleura: Interval reduction in the amount of pleural fluid in the LEFT chest since previous imaging. Very small loculated hydropneumothorax at the LEFT lung base with LEFT-sided chest tube remaining in place along the lateral margin of the costodiaphragmatic sulcus. Loculated pleural fluid in the major fissure measures 4.9 x 3.4 cm, this has increased slightly since previous imaging where it measured approximately 3.7 x 1.8 cm. Trace pleural fluid remains evident at the periphery of the dependent chest and tracking anteriorly along the inferior chest just above the LEFT hemidiaphragm, likely in communication with the small medial and basilar hydropneumothorax. Airspace disease in the LEFT chest has improved following reduction in pleural fluid. Airways are patent. Trace RIGHT-sided pleural effusion has developed since previous imaging. Small RIGHT upper lobe pulmonary nodule (image 69/5) 6 mm size. Upper Abdomen: No acute upper abdominal findings. Though there is trace ascites anterior to the liver. Musculoskeletal: No acute or destructive bone findings. Spinal degenerative changes. IMPRESSION: 1. Marked interval reduction in the large LEFT sub pulmonic effusion seen on previous imaging and improved aeration at the LEFT lung base. Still with very small LEFT-sided hydropneumothorax with chest tube remaining in place. Pneumothorax is tiny in tracks just over the RIGHT hemidiaphragm following evacuation of loculated effusion. 2. Persistent airspace disease in the LEFT lower lobe and lingula. Suggest follow-up to ensure resolution. 3. Increasing size of loculated fissural fluid in the LEFT mid chest since previous imaging. Attention on follow-up. 4. Trace RIGHT-sided pleural effusion as developed in the interval. 5. RIGHT upper lobe pulmonary nodule measuring 6 mm. Non-contrast chest CT at 6-12 months is recommended. If the nodule is stable at time of repeat CT, then future CT at 18-24  months (from today's scan) is considered optional for low-risk patients, but is recommended for high-risk patients. This recommendation follows the consensus statement: Guidelines for Management of Incidental Pulmonary Nodules Detected on CT Images: From the Fleischner Society 2017; Radiology 2017; 284:228-243. Aortic Atherosclerosis (ICD10-I70.0). Electronically Signed   By: Zetta Bills M.D.   On: 03/03/2022 16:06   DG Chest Port 1 View  Result Date: 03/03/2022 CLINICAL DATA:  [id number] Empyema [facility]) [id number] EXAM: PORTABLE CHEST 1 VIEW COMPARISON:  Chest radiograph from one day prior. FINDINGS: Left costophrenic angle pigtail chest tube in place. Stable cardiomediastinal silhouette with mild cardiomegaly. No pneumothorax. Stable small left lateral basilar pleural effusion/thickening. No right pleural effusion. No overt pulmonary edema. Stable patchy left lung base opacity. IMPRESSION: 1. No pneumothorax. Left costophrenic angle pigtail chest tube in place. 2. Stable small left lateral basilar pleural effusion/thickening. 3. Stable patchy left lung base opacity. Electronically Signed   By: Ilona Sorrel M.D.   On: 03/03/2022 08:01       Othmar Ringer M.D. [facility] 03/04/2022, 11:28 AM  Available via Epic secure chat 7am-7pm After 7 pm, please refer to night coverage provider listed on amion.

## 2022-03-04 NOTE — Telephone Encounter (Signed)
Can we set up clinic follow up with me in 4 weeks with CXR? Thanks!

## 2022-03-04 NOTE — Progress Notes (Addendum)
4- Daughter came to notify this RN patient was starting to get agitated and restless. Patient assessed and unable to be redirected at this time. Suspected that patient was having a reverse reaction to the oral ativan I had given him as a sleep aid that had been ordered today. IM zyprexa ordered by Gershon Cull NP and given per RN request for agitation/delirium. Patient appears more restful and able to calm down and rest more by 0130.

## 2022-03-04 NOTE — Progress Notes (Signed)
Physical Therapy Treatment Patient Details Name: David [name] MRN: [id number] DOB: Oct 23, 1933 Today's Date: 03/04/2022   History of Present Illness Patient is a 89 year old male who presented with SOB and cough. Patient was found to have L side effusion, left empyema with marked leukocytosis, pneumonia, and acute hypoxic respiratory failure. Patient had chest tube insertion on 1/4. 1/6 and 1/7 pleural fibrinolysis. PMH: CKD III, HTN, anemia.    PT Comments    Pt easily distracted, pulling at all lines, needing maximal multimodal cues, +2 for safety. Pt completes STS reps from EOB and recliner, heavy cues for hand placement and powering up to stand, min A to rise to standing then pivots to recliner with a few steps, min A+2 to prevent pt from pulling at lines. Pt able to perform standing marching for 20 reps x2 seconds, clears bil feet minimally and symmetrically, needing counting aloud to stay engaged and cues to avoid pulling at lines. Pt tolerates remaining up in recliner with family and NT in room at [place].   Recommendations for follow up therapy are one component of a multi-disciplinary discharge planning process, led by the attending physician.  Recommendations may be updated based on patient status, additional functional criteria and insurance authorization.  Follow Up Recommendations   (HH vs SNF-depending on progress/family ability to provide care)     Assistance Recommended at Discharge Frequent or constant Supervision/Assistance  Patient can return home with the following A lot of help with walking and/or transfers;A lot of help with bathing/dressing/bathroom;Help with stairs or ramp for entrance;Assist for transportation;Assistance with cooking/housework   Equipment Recommendations  Rolling walker (2 wheels)    Recommendations for Other Services       Precautions / Restrictions Precautions Precautions: Fall Precaution Comments: L chest tube Restrictions Weight Bearing  Restrictions: No     Mobility  Bed Mobility Overal bed mobility: Needs Assistance Bed Mobility: Supine to Sit  Supine to sit: Min assist, +2 for safety/equipment  General bed mobility comments: increased time and cues, therapist managing lines for safety, min A to upright trunk    Transfers Overall transfer level: Needs assistance Equipment used: Rolling walker (2 wheels) Transfers: Sit to/from Stand, Bed to chair/wheelchair/BSC Sit to Stand: +2 safety/equipment, Min assist  Step pivot transfers: +2 safety/equipment, Min assist  General transfer comment: min assist+2 for safety due to pt wanting to pull on lines, heavy cues for hand placement and sequencing, assist to steady, able to complete 4 STS reps and 1 step pivot with constant multimodal cues    Ambulation/Gait Ambulation/Gait assistance: Min assistAssistive device: Rolling walker (2 wheels)  Gait velocity: decreased  General Gait Details: pt able to take 3 steps over to bedside recliner, heavy multimodal cues for hand placement and RW management due to wanting to pull at lines, pt engages in standing marching 20 seconds x2 reps with RW and min guard   Stairs             Wheelchair Mobility    Modified Rankin (Stroke Patients Only)       Balance Overall balance assessment: Mild deficits observed, not formally tested     Cognition Arousal/Alertness: Awake/alert Behavior During Therapy: Flat affect Overall Cognitive Status: Impaired/Different from baseline Area of Impairment: Orientation, Following commands, Problem solving  Following Commands: Follows one step commands inconsistently  Problem Solving: Slow processing, Decreased initiation, Requires verbal cues General Comments: patient was noted to report there was a dog sitting in the room or a girl, hard to  get more information on what he was seeing. patient needed increased and consistent cues for participation in all tasks.        Exercises       General Comments        Pertinent Vitals/Pain Pain Assessment Pain Assessment: No/denies pain    Home Living Family/patient expects to be discharged to:: Private residence Living Arrangements: Spouse/significant other Available Help at Discharge: Family;Available 24 hours/day Type of Home: House Home Access: Stairs to enter   [employer] of Steps: 1   Home Layout: One level Home Equipment: Cane - single point      Prior Function            PT Goals (current goals can now be found in the care plan section) Acute Rehab PT Goals Patient Stated Goal: pt unable to state. family would like patient to be able to return home PT Goal Formulation: With family Time For Goal Achievement: 03/16/22 Potential to Achieve Goals: Good Progress towards PT goals: Progressing toward goals    Frequency    Min 3X/week      PT Plan Current plan remains appropriate    Co-evaluation PT/OT/SLP Co-Evaluation/Treatment: Yes Reason for Co-Treatment: Necessary to address cognition/behavior during functional activity;To address functional/ADL transfers PT goals addressed during session: Mobility/safety with mobility;Balance;Proper use of DME OT goals addressed during session: ADL's and self-care      AM-PAC PT "6 Clicks" Mobility   Outcome Measure  Help needed turning from your back to your side while in a flat bed without using bedrails?: A Lot Help needed moving from lying on your back to sitting on the side of a flat bed without using bedrails?: A Lot Help needed moving to and from a bed to a chair (including a wheelchair)?: A Lot Help needed standing up from a chair using your arms (e.g., wheelchair or bedside chair)?: A Lot Help needed to walk in hospital room?: A Lot Help needed climbing 3-5 steps with a railing? : Total 6 Click Score: 11    End of Session Equipment Utilized During Treatment: Gait belt Activity Tolerance: Patient tolerated treatment well Patient  left: in chair;with call bell/phone within reach;with chair alarm set;with nursing/sitter in room;with family/visitor present Nurse Communication: Mobility status PT Visit Diagnosis: Muscle weakness (generalized) (M62.81);Difficulty in walking, not elsewhere classified (R26.2)     Time: 5374-8270 PT Time Calculation (min) (ACUTE ONLY): 39 min  Charges:  $Therapeutic Activity: 8-22 mins                      Tori Issiah Huffaker PT, DPT 03/04/22, 2:13 PM

## 2022-03-04 NOTE — TOC Initial Note (Signed)
Transition of Care [facility]) - Initial/Assessment Note    Patient Details  Name: David [name] MRN: [id number] Date of Birth: 1933-09-24  [facility] CM/SW Contact:    Dessa Phi, RN Phone Number: 03/04/2022, 1:28 PM  Clinical Narrative: Carolyn(spouse) Linda(dtr) agree to home w/HHC-amedysis rep Malachy Mood following accepted-HHPT/OT. Family only wants dme that is covered under insurance. Await orders. Patient does not have rw or 3n1. Noted chest tube in place.                 Expected Discharge Plan: [place] Barriers to Discharge: Continued Medical Work up   Patient Goals and CMS Choice Patient states their goals for this hospitalization and ongoing recovery are::  (Home) CMS Medicare.gov Compare Post Acute Care list provided to:: Patient Represenative (must comment) (Linda(dtr)) Choice offered to / list presented to : Spouse, Adult Children      Expected Discharge Plan and Services   Discharge Planning Services: CM Consult Post Acute Care Choice: [place] arrangements for the past 2 months: Single Family Home                                      Prior Living Arrangements/Services Living arrangements for the past 2 months: Single Family Home Lives with:: Spouse, Adult Children Patient language and need for interpreter reviewed:: Yes Do you feel safe going back to the place where you live?: Yes      Need for Family Participation in Patient Care: Yes (Comment) Care giver support system in place?: Yes (comment)   Criminal Activity/Legal Involvement Pertinent to Current Situation/Hospitalization: No - Comment as needed  Activities of Daily Living Home Assistive Devices/Equipment: None ADL Screening (condition at time of admission) Patient's cognitive ability adequate to safely complete daily activities?: Yes Is the patient deaf or have difficulty hearing?: No Does the patient have difficulty seeing, even when wearing  glasses/contacts?: No Does the patient have difficulty concentrating, remembering, or making decisions?: No Patient able to express need for assistance with ADLs?: No Does the patient have difficulty dressing or bathing?: No Independently performs ADLs?: Yes (appropriate for developmental age) Does the patient have difficulty walking or climbing stairs?: No Weakness of Legs: None Weakness of Arms/Hands: None  Permission Sought/Granted Permission sought to share information with : Case Manager Permission granted to share information with : Yes, Verbal Permission Granted  Share Information with NAME:  (Case manager)           Emotional Assessment Appearance:: Appears stated age Attitude/Demeanor/Rapport: Gracious Affect (typically observed): Accepting Orientation: : Oriented to Self Alcohol / Substance Use: Not Applicable Psych Involvement: No (comment)  Admission diagnosis:  Pleural effusion [J90] Left lower lobe pneumonia [J18.9] Acute respiratory failure with hypoxia ([place]) [J96.01] Patient Active Problem List   Diagnosis Date Noted   Protein-calorie malnutrition, severe 03/02/2022   Empyema ([place]) 02/28/2022   Left lower lobe pneumonia 02/27/2022   Community acquired pneumonia of left lower lobe of lung 02/26/2022   Acute hypoxic respiratory failure ([place]) 02/26/2022   Pleural effusion 02/26/2022   Stage 3a chronic kidney disease (CKD) ([place]) 02/26/2022   HTN (hypertension) 02/26/2022   Normocytic anemia 02/26/2022   Rectus diastasis 01/04/2012   PCP:  [name], MD Pharmacy:   [place], [place] - [address] [place] [address] Phone: [phone] Fax: [phone]  Social Determinants of Health (SDOH) Social History: SDOH Screenings   Food Insecurity: No Food Insecurity (02/27/2022)  Housing: Low Risk  (02/27/2022)  Transportation Needs: No Transportation Needs (02/27/2022)  Utilities: Not At Risk  (02/27/2022)  Tobacco Use: Medium Risk (02/26/2022)   SDOH Interventions:     Readmission Risk Interventions     No data to display

## 2022-03-05 ENCOUNTER — Inpatient Hospital Stay (HOSPITAL_COMMUNITY): Payer: Medicare Other

## 2022-03-05 ENCOUNTER — Ambulatory Visit: Payer: PPO | Admitting: Family Medicine

## 2022-03-05 DIAGNOSIS — G9341 Metabolic encephalopathy: Secondary | ICD-10-CM

## 2022-03-05 DIAGNOSIS — J9601 Acute respiratory failure with hypoxia: Secondary | ICD-10-CM | POA: Diagnosis not present

## 2022-03-05 DIAGNOSIS — E43 Unspecified severe protein-calorie malnutrition: Secondary | ICD-10-CM | POA: Diagnosis not present

## 2022-03-05 DIAGNOSIS — J189 Pneumonia, unspecified organism: Secondary | ICD-10-CM | POA: Diagnosis not present

## 2022-03-05 DIAGNOSIS — J90 Pleural effusion, not elsewhere classified: Secondary | ICD-10-CM | POA: Diagnosis not present

## 2022-03-05 LAB — CBC
HCT: 33 % — ABNORMAL LOW (ref 39.0–52.0)
Hemoglobin: 10.6 g/dL — ABNORMAL LOW (ref 13.0–17.0)
MCH: 27.2 pg (ref 26.0–34.0)
MCHC: 32.1 g/dL (ref 30.0–36.0)
MCV: 84.6 fL (ref 80.0–100.0)
Platelets: 469 10*3/uL — ABNORMAL HIGH (ref 150–400)
RBC: 3.9 MIL/uL — ABNORMAL LOW (ref 4.22–5.81)
RDW: 15.5 % (ref 11.5–15.5)
WBC: 22.1 10*3/uL — ABNORMAL HIGH (ref 4.0–10.5)
nRBC: 0 % (ref 0.0–0.2)

## 2022-03-05 LAB — FOLATE: Folate: 15.2 ng/mL (ref >5.9)

## 2022-03-05 LAB — TSH: TSH: 3.006 u[IU]/mL (ref 0.350–4.500)

## 2022-03-05 LAB — BASIC METABOLIC PANEL
Anion gap: 6 (ref 5–15)
BUN: 17 mg/dL (ref 8–23)
CO2: 29 mmol/L (ref 22–32)
Calcium: 8.5 mg/dL — ABNORMAL LOW (ref 8.9–10.3)
Chloride: 102 mmol/L (ref 98–111)
Creatinine, Ser: 0.97 mg/dL (ref 0.61–1.24)
GFR, Estimated: >60 mL/min (ref >60)
Glucose, Bld: 100 mg/dL — ABNORMAL HIGH (ref 70–99)
Potassium: 3.3 mmol/L — ABNORMAL LOW (ref 3.5–5.1)
Sodium: 137 mmol/L (ref 135–145)

## 2022-03-05 LAB — CREATININE, SERUM
Creatinine, Ser: 0.93 mg/dL (ref 0.61–1.24)
GFR, Estimated: >60 mL/min (ref >60)

## 2022-03-05 LAB — AMMONIA: Ammonia: 19 umol/L (ref 9–35)

## 2022-03-05 LAB — VITAMIN B12: Vitamin B-12: 3009 pg/mL — ABNORMAL HIGH (ref 180–914)

## 2022-03-05 LAB — PROCALCITONIN: Procalcitonin: 0.16 ng/mL

## 2022-03-05 MED ORDER — POTASSIUM CHLORIDE 10 MEQ/100ML IV SOLN
10.0000 meq | INTRAVENOUS | Status: AC
  Administered 2022-03-05 (×3): 10 meq via INTRAVENOUS
  Filled 2022-03-05 (×3): qty 100

## 2022-03-05 MED ORDER — HYDRALAZINE HCL 20 MG/ML IJ SOLN: 10.0000 mg | Freq: Four times a day (QID) | INTRAMUSCULAR | Status: DC | PRN

## 2022-03-05 MED ORDER — QUETIAPINE FUMARATE 25 MG PO TABS
25.0000 mg | ORAL_TABLET | ORAL | Status: DC
  Administered 2022-03-05 – 2022-03-09 (×5): 25 mg via ORAL
  Filled 2022-03-05 (×5): qty 1

## 2022-03-05 MED ORDER — QUETIAPINE FUMARATE 25 MG PO TABS: 25.0000 mg | ORAL_TABLET | Freq: Every day | ORAL | Status: DC

## 2022-03-05 NOTE — Progress Notes (Signed)
Mobility Specialist - Progress Note   03/05/22 1124  Mobility  Activity Ambulated with assistance in hallway  Level of Assistance Moderate assist, patient does 50-74%  Assistive Device Front wheel walker  Distance Ambulated (ft) 80 ft  Range of Motion/Exercises Active  Activity Response Tolerated well  Mobility Referral Yes  $Mobility charge 1 Mobility   Pt was found in bed and agreeable to ambulate. C/o being tired, had poor sleep last night according to RN. Needed assistance in moving his legs to the EOB and was mod-A going from supine to sitting. During ambulation needed cues when turning at the halfway point. When returning to the recliner chair also needed cues for correct placement of hands when sitting. At EOS was left on recliner chair with all necessities in reach, chair alarm on, and family in room. RN notified of session.  Ferd Hibbs Mobility Specialist

## 2022-03-05 NOTE — Plan of Care (Signed)
  Problem: Clinical Measurements: Goal: Ability to maintain a body temperature in the normal range will improve Outcome: Progressing   Problem: Respiratory: Goal: Ability to maintain adequate ventilation will improve Outcome: Progressing Goal: Ability to maintain a clear airway will improve Outcome: Progressing   Problem: Clinical Measurements: Goal: Ability to maintain clinical measurements within normal limits will improve Outcome: Progressing Goal: Will remain free from infection Outcome: Progressing Goal: Diagnostic test results will improve Outcome: Progressing Goal: Respiratory complications will improve Outcome: Progressing Goal: Cardiovascular complication will be avoided Outcome: Progressing   Problem: Coping: Goal: Level of anxiety will decrease Outcome: Progressing   Problem: Elimination: Goal: Will not experience complications related to urinary retention Outcome: Progressing   Problem: Pain Managment: Goal: General experience of comfort will improve Outcome: Progressing   Problem: Safety: Goal: Ability to remain free from injury will improve Outcome: Progressing   Problem: Skin Integrity: Goal: Risk for impaired skin integrity will decrease Outcome: Progressing   Problem: Health Behavior/Discharge Planning: Goal: Ability to manage health-related needs will improve Outcome: Not Progressing   Problem: Activity: Goal: Risk for activity intolerance will decrease Outcome: Not Progressing   Problem: Nutrition: Goal: Adequate nutrition will be maintained Outcome: Not Progressing   Problem: Elimination: Goal: Will not experience complications related to bowel motility Outcome: Not Progressing   Problem: Safety: Goal: Non-violent Restraint(s) Outcome: Not Applicable

## 2022-03-05 NOTE — Progress Notes (Signed)
[facility]                                                                              David [name], is a 89 y.o. male, DOB - Nov 14, 1933, QAS:[id number] Admit date - 02/26/2022    Outpatient Primary MD for the patient is Lenna Gilford, Allen Norris, MD  LOS - 7  days  Chief Complaint  Patient presents with   Code Sepsis       Brief summary   Patient is a 89 year old male with CKD stage IIIa, HTN, anemia, presented with cough and shortness of breath.  Recent hospitalization for pneumonia at [facility], improved and subsequently developed more shortness of breath on 1/4.  Was found to be hypoxic in ED requiring 4 L O2 I Chest x-ray demonstrated left-sided infiltrate, CTA showed left-sided effusion and total atelectasis left lower lobe. Seen by pulmonology and chest tube placed. Treated with fibrinolytics with improvement.   Procedures 1/4 Chest Tube Insertion. Pt accidentally pulled out. 1/5 Chest Tube Insertion 1/6 Pleural Fibrinolysis Initial day  1/7 Pleural Fibrinolysis Second day  1/8 Pleural Fibrinolysis Third day. Prognosis guarded. Clinically worse. Code status DNR. 1/10: Chest tube removed 1/11: Continues to have agitation with sundowning and delirium  Assessment & Plan    Principal Problem: Acute hypoxic respiratory failure with community acquired pneumonia of left lower lobe of lung Left empyema -Initial chest CT showed complete opacification of the left lower lobe, left lower lobe bronchus occlusion, moderate debris's in trachea -PCCM was consulted, status post 3 days of lytics, completed 1/8 -Pleural fluid culture with strep intermedius, currently on IV penicillin.  Blood cultures negative -Repeat chest CT showed marked reduction in effusion, persistent airspace disease left lower lobe, increase in size of loculated fissural fluid in the left mid chest. -Chest tube removed on 1/10 -CCM recommended Augmentin as outpatient for 4 weeks total course -Leukocytosis  worse today, 22.1, possibly could be aspirating due to delirium -Diet changed to dysphagia 3 diet, obtain procalcitonin, blood cultures - if spiking fevers, obtain chest x-ray, may change antibiotics to IV Unasyn  Active Problems: Acute metabolic encephalopathy, likely has underlying dementia with sundowning -Continues to have issues with delirium and sundowning, worse last night her daughter-in-law at the bedside -Will discontinue Zyprexa and start Seroquel 25 mg at bedtime and titrate up -Will reassess in a.m., may do better on Seroquel at bedtime and Depakote 500 mg in a.m. for agitation -Avoid Ativan, had paradoxical effect on 1/10 -Obtain CT head, UA on 1/4 negative for UTI  Acute on stage 3a chronic kidney disease (CKD) (HCC) -Baseline creatinine 1.3 in 08/2017 -Worsened to 1.6 on 03/02/2022, resolved     HTN (hypertension) -BP stable, continue amlodipine, added IV hydralazine as needed with parameters    Normocytic anemia -H&H stable  Hypokalemia -Replaced IV    Protein-calorie malnutrition, severe Nutrition Problem: Severe Malnutrition Etiology: acute illness (CAP) Signs/Symptoms: moderate fat depletion, severe muscle depletion, energy intake < or equal to 50% for > or equal to 1 month, percent weight loss Interventions: Ensure Enlive (each supplement provides 350kcal and 20 grams of protein), Magic cup, MVI  Estimated body mass  index is 22.93 kg/m as calculated from the following:   Height as of this encounter: 6' (1.829 m).   Weight as of this encounter: 76.7 kg.  Code Status: DNR DVT Prophylaxis:  SCDs Start: 02/27/22 0402   Level of Care: Level of care: Progressive Family Communication: Updated patient's daughter-in-law at the bedside   Disposition Plan:      Remains inpatient appropriate: DIL reported that patient lives with his 3 year old wife who is fragile herself and not able to assist with ADLs.  With his dementia, sundowning, family is apprehensive of  safety of his wife as well.  Family cannot provide 24/7 care/supervision, requesting SNF.  TOC consulted.   Procedures:  Chest tube  Consultants:   PCCM  Antimicrobials:   Anti-infectives (From admission, onward)    Start     Dose/Rate Route Frequency Ordered Stop   03/04/22 1800  penicillin G potassium 12 Million Units in dextrose 5 % 500 mL continuous infusion        12 Million Units 41.7 mL/hr over 12 Hours Intravenous Every 12 hours 03/04/22 1258     03/03/22 1800  vancomycin (VANCOREADY) IVPB 1750 mg/350 mL  Status:  Discontinued        1,750 mg 175 mL/hr over 120 Minutes Intravenous Every 48 hours 03/02/22 0823 03/02/22 1111   03/02/22 1800  penicillin G potassium 4 Million Units in dextrose 5 % 250 mL IVPB  Status:  Discontinued        4 Million Units 250 mL/hr over 60 Minutes Intravenous Every 6 hours 03/02/22 1113 03/04/22 1257   02/27/22 1800  piperacillin-tazobactam (ZOSYN) IVPB 3.375 g  Status:  Discontinued        3.375 g 12.5 mL/hr over 240 Minutes Intravenous Every 8 hours 02/27/22 1645 03/02/22 1111   02/27/22 1600  vancomycin (VANCOREADY) IVPB 1250 mg/250 mL  Status:  Discontinued        1,250 mg 166.7 mL/hr over 90 Minutes Intravenous Every 24 hours 02/27/22 1133 03/02/22 0823   02/27/22 1200  cefTRIAXone (ROCEPHIN) 2 g in sodium chloride 0.9 % 100 mL IVPB  Status:  Discontinued        2 g 200 mL/hr over 30 Minutes Intravenous Every 24 hours 02/27/22 1103 02/27/22 1639   02/27/22 1200  azithromycin (ZITHROMAX) 500 mg in sodium chloride 0.9 % 250 mL IVPB  Status:  Discontinued        500 mg 250 mL/hr over 60 Minutes Intravenous Every 24 hours 02/27/22 1103 02/27/22 1639   02/27/22 1115  vancomycin (VANCOREADY) IVPB 1500 mg/300 mL  Status:  Discontinued        1,500 mg 150 mL/hr over 120 Minutes Intravenous  Once 02/27/22 1112 02/27/22 1112   02/26/22 1500  vancomycin (VANCOREADY) IVPB 1500 mg/300 mL        1,500 mg 150 mL/hr over 120 Minutes Intravenous  Once  02/26/22 1457 02/26/22 1850   02/26/22 1445  vancomycin (VANCOCIN) IVPB 1000 mg/200 mL premix  Status:  Discontinued        1,000 mg 200 mL/hr over 60 Minutes Intravenous  Once 02/26/22 1438 02/26/22 1457   02/26/22 1445  ceFEPIme (MAXIPIME) 2 g in sodium chloride 0.9 % 100 mL IVPB        2 g 200 mL/hr over 30 Minutes Intravenous  Once 02/26/22 1438 02/26/22 1745   02/26/22 1400  cefTRIAXone (ROCEPHIN) 1 g in sodium chloride 0.9 % 100 mL IVPB  Status:  Discontinued  1 g 200 mL/hr over 30 Minutes Intravenous  Once 02/26/22 1355 02/26/22 1435   02/26/22 1400  azithromycin (ZITHROMAX) 500 mg in sodium chloride 0.9 % 250 mL IVPB  Status:  Discontinued        500 mg 250 mL/hr over 60 Minutes Intravenous  Once 02/26/22 1355 02/26/22 1435          Medications  amLODipine  10 mg Oral Daily   aspirin EC  81 mg Oral Daily   feeding supplement  237 mL Oral TID BM   levalbuterol  0.63 mg Nebulization BID   multivitamin with minerals  1 tablet Oral Daily   mouth rinse  15 mL Mouth Rinse 4 times per day   QUEtiapine  25 mg Oral Q24H   zinc sulfate  220 mg Oral Daily      Subjective:   Berwyn Bigley was seen and examined today.  Overnight events noted, per daughter-in-law at the bedside, had a rough night with sundowning, agitation, pulled his lines and PureWick catheter multiple times.  Currently somnolent  Objective:   Vitals:   03/04/22 1936 03/04/22 2100 03/05/22 0535 03/05/22 0826  BP:  (!) 148/57 (!) 168/54   Pulse:  67 68   Resp:  20 20   Temp:  98.6 F (37 C) 98.7 F (37.1 C)   TempSrc:  Axillary Axillary   SpO2: 92% 96% 91% 92%  Weight:      Height:        Intake/Output Summary (Last 24 hours) at 03/05/2022 1107 Last data filed at 03/05/2022 9767 Gross per 24 hour  Intake 743.88 ml  Output 320 ml  Net 423.88 ml     Wt Readings from Last 3 Encounters:  02/27/22 76.7 kg  09/09/17 93 kg  11/21/16 88.5 kg   Physical Exam General: Somnolent but easily  arousable Cardiovascular: S1 S2 clear, RRR.  Respiratory: Decreased breath sound at the bases Gastrointestinal: Soft, nontender, nondistended, NBS Ext: no pedal edema bilaterally Neuro: moving all 4 extremities spontaneously but does not follow commands Skin: No rashes Psych: very confused    Data Reviewed:  I have personally reviewed following labs    CBC Lab Results  Component Value Date   WBC 22.1 (H) 03/05/2022   RBC 3.90 (L) 03/05/2022   HGB 10.6 (L) 03/05/2022   HCT 33.0 (L) 03/05/2022   MCV 84.6 03/05/2022   MCH 27.2 03/05/2022   PLT 469 (H) 03/05/2022   MCHC 32.1 03/05/2022   RDW 15.5 03/05/2022   LYMPHSABS 0.3 (L) 02/26/2022   MONOABS 2.7 (H) 02/26/2022   EOSABS 0.0 02/26/2022   BASOSABS 0.1 34/19/3790     Last metabolic panel Lab Results  Component Value Date   NA 137 03/05/2022   K 3.3 (L) 03/05/2022   CL 102 03/05/2022   CO2 29 03/05/2022   BUN 17 03/05/2022   CREATININE 0.97 03/05/2022   GLUCOSE 100 (H) 03/05/2022   GFRNONAA >60 03/05/2022   GFRAA 54 (L) 09/09/2017   CALCIUM 8.5 (L) 03/05/2022   PROT 6.0 (L) 02/27/2022   ALBUMIN 2.5 (L) 02/27/2022   BILITOT 0.6 02/27/2022   ALKPHOS 83 02/27/2022   AST 40 02/27/2022   ALT 32 02/27/2022   ANIONGAP 6 03/05/2022    CBG (last 3)  No results for input(s): "GLUCAP" in the last 72 hours.    Coagulation Profile: Recent Labs  Lab 02/26/22 1307  INR 1.8*     Radiology Studies: I have personally reviewed the imaging studies  CT HEAD WO CONTRAST (5MM)  Result Date: 03/05/2022 CLINICAL DATA:  Acute metabolic encephalopathy.  Worsening delirium. EXAM: CT HEAD WITHOUT CONTRAST TECHNIQUE: Contiguous axial images were obtained from the base of the skull through the vertex without intravenous contrast. RADIATION DOSE REDUCTION: This exam was performed according to the departmental dose-optimization program which includes automated exposure control, adjustment of the mA and/or kV according to patient  size and/or use of iterative reconstruction technique. COMPARISON:  CT head dated May 27, 2021. FINDINGS: Brain: No evidence of acute infarction, hemorrhage, hydrocephalus, extra-axial collection or mass lesion/mass effect. Stable mild atrophy and chronic microvascular ischemic changes. Vascular: Calcified atherosclerosis at the skull base. No hyperdense vessel. Skull: Normal. Negative for fracture or focal lesion. Sinuses/Orbits: No acute finding. Chronic left sphenoid sinus disease. Other: None. IMPRESSION: 1. No acute intracranial abnormality. Stable mild atrophy and chronic microvascular ischemic changes. Electronically Signed   By: Titus Dubin M.D.   On: 03/05/2022 10:45   CT CHEST WO CONTRAST  Result Date: 03/03/2022 CLINICAL DATA:  89 year old male presents for evaluation of pneumonia. EXAM: CT CHEST WITHOUT CONTRAST TECHNIQUE: Multidetector CT imaging of the chest was performed following the standard protocol without IV contrast. RADIATION DOSE REDUCTION: This exam was performed according to the departmental dose-optimization program which includes automated exposure control, adjustment of the mA and/or kV according to patient size and/or use of iterative reconstruction technique. COMPARISON:  February 26, 2022. FINDINGS: Cardiovascular: Calcified aortic atherosclerosis similar to prior imaging. Heart size is stable and moderately enlarged with small but unchanged pericardial effusion. Calcified coronary artery disease, RIGHT and LEFT coronary circulation. Dense aortic valvular calcifications. Central pulmonary vasculature is normal caliber. Limited assessment of cardiovascular structures given lack of intravenous contrast. Mediastinum/Nodes: No thoracic inlet, axillary, mediastinal or hilar adenopathy. Esophagus grossly normal. Lungs/Pleura: Interval reduction in the amount of pleural fluid in the LEFT chest since previous imaging. Very small loculated hydropneumothorax at the LEFT lung base with  LEFT-sided chest tube remaining in place along the lateral margin of the costodiaphragmatic sulcus. Loculated pleural fluid in the major fissure measures 4.9 x 3.4 cm, this has increased slightly since previous imaging where it measured approximately 3.7 x 1.8 cm. Trace pleural fluid remains evident at the periphery of the dependent chest and tracking anteriorly along the inferior chest just above the LEFT hemidiaphragm, likely in communication with the small medial and basilar hydropneumothorax. Airspace disease in the LEFT chest has improved following reduction in pleural fluid. Airways are patent. Trace RIGHT-sided pleural effusion has developed since previous imaging. Small RIGHT upper lobe pulmonary nodule (image 69/5) 6 mm size. Upper Abdomen: No acute upper abdominal findings. Though there is trace ascites anterior to the liver. Musculoskeletal: No acute or destructive bone findings. Spinal degenerative changes. IMPRESSION: 1. Marked interval reduction in the large LEFT sub pulmonic effusion seen on previous imaging and improved aeration at the LEFT lung base. Still with very small LEFT-sided hydropneumothorax with chest tube remaining in place. Pneumothorax is tiny in tracks just over the RIGHT hemidiaphragm following evacuation of loculated effusion. 2. Persistent airspace disease in the LEFT lower lobe and lingula. Suggest follow-up to ensure resolution. 3. Increasing size of loculated fissural fluid in the LEFT mid chest since previous imaging. Attention on follow-up. 4. Trace RIGHT-sided pleural effusion as developed in the interval. 5. RIGHT upper lobe pulmonary nodule measuring 6 mm. Non-contrast chest CT at 6-12 months is recommended. If the nodule is stable at time of repeat CT, then future CT at 18-24 months (from  today's scan) is considered optional for low-risk patients, but is recommended for high-risk patients. This recommendation follows the consensus statement: Guidelines for Management of  Incidental Pulmonary Nodules Detected on CT Images: From the Fleischner Society 2017; Radiology 2017; 284:228-243. Aortic Atherosclerosis (ICD10-I70.0). Electronically Signed   By: Zetta Bills M.D.   On: 03/03/2022 16:06       Bentley Haralson M.D. [facility] 03/05/2022, 11:07 AM  Available via Epic secure chat 7am-7pm After 7 pm, please refer to night coverage provider listed on amion.

## 2022-03-06 DIAGNOSIS — J90 Pleural effusion, not elsewhere classified: Secondary | ICD-10-CM | POA: Diagnosis not present

## 2022-03-06 DIAGNOSIS — J9601 Acute respiratory failure with hypoxia: Secondary | ICD-10-CM | POA: Diagnosis not present

## 2022-03-06 DIAGNOSIS — J869 Pyothorax without fistula: Secondary | ICD-10-CM | POA: Diagnosis not present

## 2022-03-06 DIAGNOSIS — J189 Pneumonia, unspecified organism: Secondary | ICD-10-CM | POA: Diagnosis not present

## 2022-03-06 LAB — BASIC METABOLIC PANEL
Anion gap: 5 (ref 5–15)
BUN: 16 mg/dL (ref 8–23)
CO2: 28 mmol/L (ref 22–32)
Calcium: 8.2 mg/dL — ABNORMAL LOW (ref 8.9–10.3)
Chloride: 103 mmol/L (ref 98–111)
Creatinine, Ser: 1 mg/dL (ref 0.61–1.24)
GFR, Estimated: >60 mL/min (ref >60)
Glucose, Bld: 105 mg/dL — ABNORMAL HIGH (ref 70–99)
Potassium: 3.6 mmol/L (ref 3.5–5.1)
Sodium: 136 mmol/L (ref 135–145)

## 2022-03-06 LAB — CBC
HCT: 29.5 % — ABNORMAL LOW (ref 39.0–52.0)
Hemoglobin: 9.4 g/dL — ABNORMAL LOW (ref 13.0–17.0)
MCH: 27 pg (ref 26.0–34.0)
MCHC: 31.9 g/dL (ref 30.0–36.0)
MCV: 84.8 fL (ref 80.0–100.0)
Platelets: 394 10*3/uL (ref 150–400)
RBC: 3.48 MIL/uL — ABNORMAL LOW (ref 4.22–5.81)
RDW: 15.5 % (ref 11.5–15.5)
WBC: 21.5 10*3/uL — ABNORMAL HIGH (ref 4.0–10.5)
nRBC: 0 % (ref 0.0–0.2)

## 2022-03-06 NOTE — Telephone Encounter (Signed)
Patient is scheduled on 04/06/2022 at 1:30pm with Dr. Verlee Monte. Reminder mailed to address on file. Nothing further needed.

## 2022-03-06 NOTE — Plan of Care (Signed)
  Problem: Respiratory: Goal: Ability to maintain adequate ventilation will improve Outcome: Progressing Goal: Ability to maintain a clear airway will improve Outcome: Progressing   Problem: Coping: Goal: Level of anxiety will decrease Outcome: Progressing

## 2022-03-06 NOTE — Progress Notes (Signed)
Physical Therapy Treatment Patient Details Name: David [name] MRN: [id number] DOB: Nov 26, 1933 Today's Date: 03/06/2022   History of Present Illness Patient is a 89 year old male who presented with SOB and cough. Patient was found to have L side effusion, left empyema with marked leukocytosis, pneumonia, and acute hypoxic respiratory failure. Patient had chest tube insertion on 1/4. 1/6 and 1/7 pleural fibrinolysis. PMH: CKD III, HTN, anemia.    PT Comments    General Comments: AxO x 2 pleasant, cooperative, few words (flat affect). Son in [place] present during session.  Assisted with amb pt in hallway.  General transfer comment: 50% VC's on proper hand placement and safety with turns.  Also asissted with a toilet transfer with unsteadiness with turns and back steps.  Slow moving.  Delayed balance coorection.  HIGH FALL RISK. General Gait Details: pt required Mod?Min Assist with walker at 50% VC's on proper walker to self distance as well as safety with turns.  Slow gait.  Shuffled steps.  Poor forward flex posture.  Limited distance due to mild dyspnea and Max c/o fatigue. Recliner following for safety.  Assisted to bathroom for a BM.  Required asisst for peri care due to impaired balance and fatigue.  Positioned in recliner to comfort. Prior to admit, pt was amb Indep without any AD.  Pt will need ST Rehab at SNF to address mobility and functional decline prior to safely returning home.   Recommendations for follow up therapy are one component of a multi-disciplinary discharge planning process, led by the attending physician.  Recommendations may be updated based on patient status, additional functional criteria and insurance authorization.  Follow Up Recommendations  Skilled nursing-short term rehab (<3 hours/day) Can patient physically be transported by private vehicle: Yes   Assistance Recommended at Discharge Frequent or constant Supervision/Assistance  Patient can return home with the  following A lot of help with walking and/or transfers;A lot of help with bathing/dressing/bathroom;Help with stairs or ramp for entrance;Assist for transportation;Assistance with cooking/housework   Equipment Recommendations  Rolling walker (2 wheels)    Recommendations for Other Services       Precautions / Restrictions Precautions Precautions: Fall Precaution Comments: monitor sats Restrictions Weight Bearing Restrictions: No     Mobility  Bed Mobility               General bed mobility comments: Pt OOB in recliner    Transfers Overall transfer level: Needs assistance Equipment used: Rolling walker (2 wheels) Transfers: Sit to/from Stand, Bed to chair/wheelchair/BSC Sit to Stand: Min assist, Mod assist Stand pivot transfers: Mod assist         General transfer comment: 50% VC's on proper hand placement and safety with turns.  Also asissted with a toilet transfer with unsteadiness with turns and back steps.  Slow moving.  Delayed balance coorection.  HIGH FALL RISK.    Ambulation/Gait Ambulation/Gait assistance: Min assist, Mod assist Gait Distance (Feet): 35 Feet Assistive device: Rolling walker (2 wheels) Gait Pattern/deviations: Step-through pattern, Decreased stride length, Trunk flexed, Shuffle Gait velocity: decreased     General Gait Details: pt required Mod?Min Assist with walker at 50% VC's on proper walker to self distance as well as safety with turns.  Slow gait.  Shuffled steps.  Poor forward flex posture.  Limited distance due to mild dyspnea and Max c/o fatigue.   Stairs             Wheelchair Mobility    Modified Rankin (Stroke Patients Only)  Balance                                            Cognition Arousal/Alertness: Awake/alert Behavior During Therapy: Flat affect Overall Cognitive Status: Within Functional Limits for tasks assessed                                 General Comments:  AxO x 2 pleasant, cooperative, few words (flat affect).        Exercises      General Comments        Pertinent Vitals/Pain Pain Assessment Pain Assessment: Faces Faces Pain Scale: Hurts a little bit Pain Location: L chest/vitals WNL/reported to RN.  "comes and goes" stated pt. Pain Descriptors / Indicators: Sharp Pain Intervention(s): Monitored during session    Home Living                          Prior Function            PT Goals (current goals can now be found in the care plan section) Progress towards PT goals: Progressing toward goals    Frequency    Min 3X/week      PT Plan Current plan remains appropriate    Co-evaluation              AM-PAC PT "6 Clicks" Mobility   Outcome Measure  Help needed turning from your back to your side while in a flat bed without using bedrails?: A Lot Help needed moving from lying on your back to sitting on the side of a flat bed without using bedrails?: A Lot Help needed moving to and from a bed to a chair (including a wheelchair)?: A Lot Help needed standing up from a chair using your arms (e.g., wheelchair or bedside chair)?: A Lot Help needed to walk in hospital room?: A Lot Help needed climbing 3-5 steps with a railing? : Total 6 Click Score: 11    End of Session Equipment Utilized During Treatment: Gait belt Activity Tolerance: Patient limited by fatigue Patient left: in chair;with call bell/phone within reach;with chair alarm set;with family/visitor present Nurse Communication: Mobility status PT Visit Diagnosis: Muscle weakness (generalized) (M62.81);Difficulty in walking, not elsewhere classified (R26.2)     Time: 5284-1324 PT Time Calculation (min) (ACUTE ONLY): 27 min  Charges:  $Gait Training: 8-22 mins $Therapeutic Activity: 8-22 mins                    Rica Koyanagi  PTA Acute  Rehabilitation Services Office M-F          [phone] Weekend pager [phone]

## 2022-03-06 NOTE — Progress Notes (Signed)
Occupational Therapy Treatment Patient Details Name: David [name] MRN: [id number] DOB: 27-Jun-1933 Today's Date: 03/06/2022   History of present illness Patient is a 89 year old male who presented with SOB and cough. Patient was found to have L side effusion, left empyema with marked leukocytosis, pneumonia, and acute hypoxic respiratory failure. Patient had chest tube insertion on 1/4. 1/6 and 1/7 pleural fibrinolysis. PMH: CKD III, HTN, anemia.   OT comments  Patient able to stand at sink and brush his hair, don his sock and slipper and perform a pseudo activity to don pants without loss of balance. He can take his hands off of the walker. He ambulated in the hall with walker with SBA. He was able to turn multiple times to get untangled from IV line without loss of balance. Patient progressing well. Cont POC.    Recommendations for follow up therapy are one component of a multi-disciplinary discharge planning process, led by the attending physician.  Recommendations may be updated based on patient status, additional functional criteria and insurance authorization.    Follow Up Recommendations  Home health OT     Assistance Recommended at Discharge Frequent or constant Supervision/Assistance  Patient can return home with the following  A little help with walking and/or transfers;A little help with bathing/dressing/bathroom;Assistance with cooking/housework;Direct supervision/assist for financial management;Direct supervision/assist for medications management   Equipment Recommendations  None recommended by OT    Recommendations for Other Services      Precautions / Restrictions Precautions Precautions: Fall Precaution Comments: monitor sats Restrictions Weight Bearing Restrictions: No       Mobility Bed Mobility                    Transfers                         Balance Overall balance assessment: Mild deficits observed, not formally tested                                          ADL either performed or assessed with clinical judgement   ADL Overall ADL's : Needs assistance/impaired     Grooming: Standing;Min guard;Brushing hair Grooming Details (indicate cue type and reason): stood at sink to brush hair             Lower Body Dressing: Supervision/safety;Sit to/from stand Lower Body Dressing Details (indicate cue type and reason): patient able to don sock and slipper. Demonstrated ability to stand and perform pseudo pant activity - exhibiting ability to manage clothing and maintain balance.             Functional mobility during ADLs: Min guard;Supervision/safety General ADL Comments: Min guard to supervision to ambulate in hall with walker. min guard for ADLs but no overt loss of balance. Able to stand and take hands off of walker for ADL task.    Extremity/Trunk Assessment Upper Extremity Assessment Upper Extremity Assessment: Overall WFL for tasks assessed   Lower Extremity Assessment Lower Extremity Assessment: Defer to PT evaluation   Cervical / Trunk Assessment Cervical / Trunk Assessment: Normal    Vision   Vision Assessment?: No apparent visual deficits   Perception     Praxis      Cognition Arousal/Alertness: Awake/alert Behavior During Therapy: WFL for tasks assessed/performed Overall Cognitive Status: Within Functional Limits for tasks assessed  Exercises      Shoulder Instructions       General Comments      Pertinent Vitals/ Pain       Pain Assessment Pain Assessment: No/denies pain  Home Living                                          Prior Functioning/Environment              Frequency  Min 2X/week        Progress Toward Goals  OT Goals(current goals can now be found in the care plan section)  Progress towards OT goals: Progressing toward goals  Acute Rehab OT Goals Patient  Stated Goal: to go home OT Goal Formulation: With patient Time For Goal Achievement: 03/18/22 Potential to Achieve Goals: Good  Plan Discharge plan remains appropriate    Co-evaluation          OT goals addressed during session: ADL's and self-care      AM-PAC OT "6 Clicks" Daily Activity     Outcome Measure   Help from another person eating meals?: A Little Help from another person taking care of personal grooming?: A Little Help from another person toileting, which includes using toliet, bedpan, or urinal?: A Lot Help from another person bathing (including washing, rinsing, drying)?: A Little Help from another person to put on and taking off regular upper body clothing?: A Little Help from another person to put on and taking off regular lower body clothing?: A Little 6 Click Score: 17    End of Session Equipment Utilized During Treatment: Gait belt  OT Visit Diagnosis: Unsteadiness on feet (R26.81);Other abnormalities of gait and mobility (R26.89);Muscle weakness (generalized) (M62.81)   Activity Tolerance Patient tolerated treatment well   Patient Left in chair;with call bell/phone within reach;with chair alarm set;with family/visitor present   Nurse Communication Mobility status        Time: 1761-6073 OT Time Calculation (min): 15 min  Charges: OT General Charges $OT Visit: 1 Visit OT Treatments $Self Care/Home Management : 8-22 mins  Gustavo Lah, OTR/L [place]  Office [phone]   Lenward Chancellor 03/06/2022, 3:14 PM

## 2022-03-06 NOTE — Evaluation (Signed)
Clinical/Bedside Swallow Evaluation Patient Details  Name: David [name] MRN: [id number] Date of Birth: 14-Jun-1933  Today's Date: 03/06/2022 Time: SLP Start Time (ACUTE ONLY): 1101 SLP Stop Time (ACUTE ONLY): 1130 SLP Time Calculation (min) (ACUTE ONLY): 29 min  Past Medical History:  Past Medical History:  Diagnosis Date   Hypertension    Past Surgical History:  Past Surgical History:  Procedure Laterality Date   COLON SURGERY     KNEE SURGERY  2009 or 2010   replaced knee cap   PROSTATE SURGERY  2012   HPI:  Pt awake in chair - family present.  Admitted with dyspnea - found to have an emypema.  He has PMH+ smoking, pna, COVID x2, FTT, colon mass s/p resection.  Son, Timmothy Sours, reports pt with progressive weight loss and has been sick over the last several months. Swallow evaluation ordered.    Assessment / Plan / Recommendation  Clinical Impression  Patient presents with functional oropharyngeal swallow ability based on clinical swallow evaluation. No indications of airway compromise with po intake including 3 graham crackers with peanutbutter, Coffee and 7 ounces thin water. Pt passed his 3 ounce Yale water challenge indicating low risk of silent aspiration.  Reciprocity of swallow/respiration was adequate.  He was noted to have eructation x2 - denies refluxing.  At baseline, frequent throat clearing noted  - ? nasal drainage?  and pt is xerostomic. Recommend diet as tolerated to help maximize po given his FTT and general precautons. Educated family and pt to findings/recommendations, purpose of 3 ounce Yale and compensations for xerostomia. Further reviewed importance of oral care for pulmonary health. SLP Visit Diagnosis: Dysphagia, unspecified (R13.10)    Aspiration Risk       Diet Recommendation Regular;Thin liquid   Liquid Administration via: Cup;Straw Medication Administration: Whole meds with liquid Supervision: Patient able to self feed Compensations: Slow rate;Small  sips/bites Postural Changes: Seated upright at 90 degrees;Remain upright for at least 30 minutes after po intake    Other  Recommendations Oral Care Recommendations: Oral care BID    Recommendations for follow up therapy are one component of a multi-disciplinary discharge planning process, led by the attending physician.  Recommendations may be updated based on patient status, additional functional criteria and insurance authorization.  Follow up Recommendations No SLP follow up      Assistance Recommended at Discharge  N/a  Functional Status Assessment Patient has not had a recent decline in their functional status  Frequency and Duration     N/a       Prognosis   N/a     Swallow Study   General Date of Onset: 03/06/22 HPI: Pt awake in chair - family present.  Admitted with dyspnea - found to have an emypema.  He has PMH+ smoking, pna, COVID x2, FTT, colon mass s/p resection.  Son, Timmothy Sours, reports pt with progressive weight loss and has been sick over the last several months. Swallow evaluation ordered. Type of Study: Bedside Swallow Evaluation Diet Prior to this Study: Thin liquids;Dysphagia 3 (soft) Temperature Spikes Noted: No Respiratory Status: Nasal cannula History of Recent Intubation: No Behavior/Cognition: Alert;Cooperative;Pleasant mood;Other (Comment) (flat affect) Oral Cavity Assessment: Dry Oral Care Completed by SLP: No Oral Cavity - Dentition: Dentures, top;Adequate natural dentition Vision: Functional for self-feeding Self-Feeding Abilities: Able to feed self Patient Positioning: Upright in bed Baseline Vocal Quality: Low vocal intensity;Hoarse Volitional Cough: Strong Volitional Swallow: Able to elicit    Oral/Motor/Sensory Function Overall Oral Motor/Sensory Function: Within functional limits  Ice Chips Ice chips: Not tested   Thin Liquid Thin Liquid: Within functional limits Presentation: Self Fed;Cup;Spoon    Nectar Thick Nectar Thick Liquid: Not  tested   Honey Thick Honey Thick Liquid: Not tested   Puree Puree: Within functional limits Presentation: Self Fed;Spoon   Solid     Solid: Within functional limits Presentation: Self Fed;Spoon      Macario Golds 03/06/2022,11:41 AM Kathleen Lime, MS [facility] SLP [place] Office [phone] Pager [phone]

## 2022-03-06 NOTE — TOC Progression Note (Addendum)
Transition of Care [facility]) - Progression Note    Patient Details  Name: David [name] MRN: [id number] Date of Birth: April 29, 1933  Transition of Care [facility]) CM/SW Contact  Leeroy Cha, RN Phone Number: 03/06/2022, 3:00 PM  Clinical Narrative:    Tct-daughter-wants father to go to either [employer] in [place] or Programme researcher, broadcasting/film/video in [place]. Explained the process to the daughter, Fl2 sent out to area snf including [employer]. Expected Discharge Plan: [place] Barriers to Discharge: Continued Medical Work up  Expected Discharge Plan and Services   Discharge Planning Services: CM Consult Post Acute Care Choice: [place] arrangements for the past 2 months: Single Family Home                                       Social Determinants of Health (SDOH) Interventions SDOH Screenings   Food Insecurity: No Food Insecurity (02/27/2022)  Housing: Low Risk  (02/27/2022)  Transportation Needs: No Transportation Needs (02/27/2022)  Utilities: Not At Risk (02/27/2022)  Tobacco Use: Medium Risk (02/26/2022)    Readmission Risk Interventions   No data to display

## 2022-03-06 NOTE — Progress Notes (Signed)
[facility]                                                                              David [name], is a 89 y.o. male, DOB - 19-Oct-1933, DDU:[id number] Admit date - 02/26/2022    Outpatient Primary MD for the patient is Lenna Gilford, Allen Norris, MD  LOS - 8  days  Chief Complaint  Patient presents with   Code Sepsis       Brief summary   Patient is a 89 year old male with CKD stage IIIa, HTN, anemia, presented with cough and shortness of breath.  Recent hospitalization for pneumonia at [facility], improved and subsequently developed more shortness of breath on 1/4.  Was found to be hypoxic in ED requiring 4 L O2 I Chest x-ray demonstrated left-sided infiltrate, CTA showed left-sided effusion and total atelectasis left lower lobe. Seen by pulmonology and chest tube placed. Treated with fibrinolytics with improvement.   Procedures 1/4 Chest Tube Insertion. Pt accidentally pulled out. 1/5 Chest Tube Insertion 1/6 Pleural Fibrinolysis Initial day  1/7 Pleural Fibrinolysis Second day  1/8 Pleural Fibrinolysis Third day. Prognosis guarded. Clinically worse. Code status DNR. 1/10: Chest tube removed 1/11: Continues to have agitation with sundowning and delirium  Assessment & Plan    Principal Problem: Acute hypoxic respiratory failure with community acquired pneumonia of left lower lobe of lung Left empyema -Initial chest CT showed complete opacification of the left lower lobe, left lower lobe bronchus occlusion, moderate debris's in trachea -PCCM was consulted, status post 3 days of lytics, completed 1/8 -Pleural fluid culture with strep intermedius, currently on IV penicillin.  Blood cultures negative -Repeat chest CT showed marked reduction in effusion, persistent airspace disease left lower lobe, increase in size of loculated fissural fluid in the left mid chest. -Chest tube removed on 1/10 -CCM recommended Augmentin as outpatient for 4 weeks total course -Improving, O2  sats 96% on room air -Passed SLP eval, leukocytosis improving, procalcitonin 0.16  Active Problems: Acute metabolic encephalopathy, has underlying dementia with sundowning -Continues to have issues with delirium and sundowning, worse at night -Avoid Ativan, had paradoxical effect on 1/10 -CT head negative for any stroke, stable mild atrophy and chronic microvascular ischemic changes -Zyprexa did not help much, started on Seroquel 25 mg nightly.  Per family, was able to sleep better last night and is alert and oriented today  Acute on stage 3a chronic kidney disease (CKD) ([place]) -Baseline creatinine 1.3 in 08/2017 -Worsened to 1.6 on 03/02/2022, resolved     HTN (hypertension) -BP stable continue amlodipine      Normocytic anemia -Hemoglobin fairly stable, follow  Hypokalemia -Resolved    Protein-calorie malnutrition, severe Nutrition Problem: Severe Malnutrition Etiology: acute illness (CAP) Signs/Symptoms: moderate fat depletion, severe muscle depletion, energy intake < or equal to 50% for > or equal to 1 month, percent weight loss Interventions: Ensure Enlive (each supplement provides 350kcal and 20 grams of protein), Magic cup, MVI  Estimated body mass index is 22.93 kg/m as calculated from the following:   Height as of this encounter: 6' (1.829 m).   Weight as of this encounter: 76.7 kg.  Code  Status: DNR DVT Prophylaxis:  SCDs Start: 02/27/22 0402   Level of Care: Level of care: Progressive Family Communication: Updated patient's daughter and son at the bedside   Disposition Plan:      Remains inpatient appropriate: Family reported that patient lives with his 104 year old wife who is fragile herself and not able to assist with ADLs.  With his dementia, sundowning, family is apprehensive of safety of his wife as well.  Family cannot provide 24/7 care/supervision, requesting SNF.  TOC consulted.   Procedures:  Chest tube  Consultants:   PCCM  Antimicrobials:    Anti-infectives (From admission, onward)    Start     Dose/Rate Route Frequency Ordered Stop   03/04/22 1800  penicillin G potassium 12 Million Units in dextrose 5 % 500 mL continuous infusion        12 Million Units 41.7 mL/hr over 12 Hours Intravenous Every 12 hours 03/04/22 1258     03/03/22 1800  vancomycin (VANCOREADY) IVPB 1750 mg/350 mL  Status:  Discontinued        1,750 mg 175 mL/hr over 120 Minutes Intravenous Every 48 hours 03/02/22 0823 03/02/22 1111   03/02/22 1800  penicillin G potassium 4 Million Units in dextrose 5 % 250 mL IVPB  Status:  Discontinued        4 Million Units 250 mL/hr over 60 Minutes Intravenous Every 6 hours 03/02/22 1113 03/04/22 1257   02/27/22 1800  piperacillin-tazobactam (ZOSYN) IVPB 3.375 g  Status:  Discontinued        3.375 g 12.5 mL/hr over 240 Minutes Intravenous Every 8 hours 02/27/22 1645 03/02/22 1111   02/27/22 1600  vancomycin (VANCOREADY) IVPB 1250 mg/250 mL  Status:  Discontinued        1,250 mg 166.7 mL/hr over 90 Minutes Intravenous Every 24 hours 02/27/22 1133 03/02/22 0823   02/27/22 1200  cefTRIAXone (ROCEPHIN) 2 g in sodium chloride 0.9 % 100 mL IVPB  Status:  Discontinued        2 g 200 mL/hr over 30 Minutes Intravenous Every 24 hours 02/27/22 1103 02/27/22 1639   02/27/22 1200  azithromycin (ZITHROMAX) 500 mg in sodium chloride 0.9 % 250 mL IVPB  Status:  Discontinued        500 mg 250 mL/hr over 60 Minutes Intravenous Every 24 hours 02/27/22 1103 02/27/22 1639   02/27/22 1115  vancomycin (VANCOREADY) IVPB 1500 mg/300 mL  Status:  Discontinued        1,500 mg 150 mL/hr over 120 Minutes Intravenous  Once 02/27/22 1112 02/27/22 1112   02/26/22 1500  vancomycin (VANCOREADY) IVPB 1500 mg/300 mL        1,500 mg 150 mL/hr over 120 Minutes Intravenous  Once 02/26/22 1457 02/26/22 1850   02/26/22 1445  vancomycin (VANCOCIN) IVPB 1000 mg/200 mL premix  Status:  Discontinued        1,000 mg 200 mL/hr over 60 Minutes Intravenous  Once  02/26/22 1438 02/26/22 1457   02/26/22 1445  ceFEPIme (MAXIPIME) 2 g in sodium chloride 0.9 % 100 mL IVPB        2 g 200 mL/hr over 30 Minutes Intravenous  Once 02/26/22 1438 02/26/22 1745   02/26/22 1400  cefTRIAXone (ROCEPHIN) 1 g in sodium chloride 0.9 % 100 mL IVPB  Status:  Discontinued        1 g 200 mL/hr over 30 Minutes Intravenous  Once 02/26/22 1355 02/26/22 1435   02/26/22 1400  azithromycin (ZITHROMAX) 500 mg in sodium chloride  0.9 % 250 mL IVPB  Status:  Discontinued        500 mg 250 mL/hr over 60 Minutes Intravenous  Once 02/26/22 1355 02/26/22 1435          Medications  amLODipine  10 mg Oral Daily   aspirin EC  81 mg Oral Daily   feeding supplement  237 mL Oral TID BM   levalbuterol  0.63 mg Nebulization BID   multivitamin with minerals  1 tablet Oral Daily   mouth rinse  15 mL Mouth Rinse 4 times per day   QUEtiapine  25 mg Oral Q24H   zinc sulfate  220 mg Oral Daily      Subjective:   David [name] was seen and examined today. Family at the bedside, he slept better last night, today patient is alert and oriented.  Feels better, passed swallow study.  No acute nausea vomiting, abdominal pain, diarrhea.  Afebrile.  Objective:   Vitals:   03/05/22 2017 03/06/22 0500 03/06/22 0739 03/06/22 1020  BP: (!) 125/58 (!) 167/55  (!) 141/56  Pulse: 72 63    Resp: 20 (!) 22    Temp: 98.3 F (36.8 C) 98 F (36.7 C)    TempSrc: Oral Axillary    SpO2: 97% 94% 96%   Weight:      Height:        Intake/Output Summary (Last 24 hours) at 03/06/2022 1206 Last data filed at 03/05/2022 2018 Gross per 24 hour  Intake 616.63 ml  Output 400 ml  Net 216.63 ml     Wt Readings from Last 3 Encounters:  02/27/22 76.7 kg  09/09/17 93 kg  11/21/16 88.5 kg    Physical Exam General: Alert and oriented x 3, NAD, fairly close to his baseline mental status, able to tell his family members, time and place Cardiovascular: S1 S2 clear, RRR.  Respiratory: CTAB, no  wheezing Gastrointestinal: Soft, nontender, nondistended, NBS Ext: no pedal edema bilaterally Neuro: no new deficits, strength 5/5 in b/l UE and LE Psych: Normal affect     Data Reviewed:  I have personally reviewed following labs    CBC Lab Results  Component Value Date   WBC 21.5 (H) 03/06/2022   RBC 3.48 (L) 03/06/2022   HGB 9.4 (L) 03/06/2022   HCT 29.5 (L) 03/06/2022   MCV 84.8 03/06/2022   MCH 27.0 03/06/2022   PLT 394 03/06/2022   MCHC 31.9 03/06/2022   RDW 15.5 03/06/2022   LYMPHSABS 0.3 (L) 02/26/2022   MONOABS 2.7 (H) 02/26/2022   EOSABS 0.0 02/26/2022   BASOSABS 0.1 32/95/1884     Last metabolic panel Lab Results  Component Value Date   NA 136 03/06/2022   K 3.6 03/06/2022   CL 103 03/06/2022   CO2 28 03/06/2022   BUN 16 03/06/2022   CREATININE 1.00 03/06/2022   GLUCOSE 105 (H) 03/06/2022   GFRNONAA >60 03/06/2022   GFRAA 54 (L) 09/09/2017   CALCIUM 8.2 (L) 03/06/2022   PROT 6.0 (L) 02/27/2022   ALBUMIN 2.5 (L) 02/27/2022   BILITOT 0.6 02/27/2022   ALKPHOS 83 02/27/2022   AST 40 02/27/2022   ALT 32 02/27/2022   ANIONGAP 5 03/06/2022    CBG (last 3)  No results for input(s): "GLUCAP" in the last 72 hours.    Coagulation Profile: No results for input(s): "INR", "PROTIME" in the last 168 hours.    Radiology Studies: I have personally reviewed the imaging studies  CT HEAD [place] (5MM)  Result Date: 03/05/2022 CLINICAL DATA:  Acute metabolic encephalopathy.  Worsening delirium. EXAM: CT HEAD WITHOUT CONTRAST TECHNIQUE: Contiguous axial images were obtained from the base of the skull through the vertex without intravenous contrast. RADIATION DOSE REDUCTION: This exam was performed according to the departmental dose-optimization program which includes automated exposure control, adjustment of the mA and/or kV according to patient size and/or use of iterative reconstruction technique. COMPARISON:  CT head dated May 27, 2021. FINDINGS: Brain: No  evidence of acute infarction, hemorrhage, hydrocephalus, extra-axial collection or mass lesion/mass effect. Stable mild atrophy and chronic microvascular ischemic changes. Vascular: Calcified atherosclerosis at the skull base. No hyperdense vessel. Skull: Normal. Negative for fracture or focal lesion. Sinuses/Orbits: No acute finding. Chronic left sphenoid sinus disease. Other: None. IMPRESSION: 1. No acute intracranial abnormality. Stable mild atrophy and chronic microvascular ischemic changes. Electronically Signed   By: Titus Dubin M.D.   On: 03/05/2022 10:45       Etna Forquer M.D. [facility] 03/06/2022, 12:06 PM  Available via Epic secure chat 7am-7pm After 7 pm, please refer to night coverage provider listed on amion.

## 2022-03-06 NOTE — NC FL2 (Signed)
[place] LEVEL OF CARE FORM     IDENTIFICATION  Patient Name: David [name] Birthdate: 1934-02-07 Sex: male Admission Date (Current Location): 02/26/2022  [facility] and [place] Number:  Herbalist and Address:  [facility],  [place] [address] [place]      Provider Number: [id number]  Attending Physician Name and Address:  Mendel Corning, MD  Relative Name and Phone Number:       Current Level of Care: Hospital Recommended Level of Care: [place] Prior Approval Number:    Date Approved/Denied:   PASRR Number: [id number]  Discharge Plan: SNF    Current Diagnoses: Patient Active Problem List   Diagnosis Date Noted   Protein-calorie malnutrition, severe 03/02/2022   Empyema ([place]) 02/28/2022   Left lower lobe pneumonia 02/27/2022   Community acquired pneumonia of left lower lobe of lung 02/26/2022   Acute hypoxic respiratory failure ([place]) 02/26/2022   Pleural effusion 02/26/2022   Stage 3a chronic kidney disease (CKD) ([place]) 02/26/2022   HTN (hypertension) 02/26/2022   Normocytic anemia 02/26/2022   Rectus diastasis 01/04/2012    Orientation RESPIRATION BLADDER Height & Weight     Self, Time, Situation, Place  Normal Continent Weight: 76.7 kg Height:  6' (182.9 cm)  BEHAVIORAL SYMPTOMS/MOOD NEUROLOGICAL BOWEL NUTRITION STATUS      Continent Diet (regular)  AMBULATORY STATUS COMMUNICATION OF NEEDS Skin   Extensive Assist Verbally Normal                       Personal Care Assistance Level of Assistance  Bathing, Feeding, Dressing Bathing Assistance: Limited assistance Feeding assistance: Limited assistance Dressing Assistance: Limited assistance     Functional Limitations Info  Sight, Hearing, Speech Sight Info: Adequate Hearing Info: Adequate Speech Info: Adequate    SPECIAL CARE FACTORS FREQUENCY  PT (By licensed PT), OT (By licensed OT)     PT Frequency: 5 x weekly OT Frequency: 5 x  weekly            Contractures Contractures Info: Not present    Additional Factors Info  Code Status Code Status Info: DNR             Current Medications (03/06/2022):  This is the current hospital active medication list Current Facility-Administered Medications  Medication Dose Route Frequency Provider Last Rate Last Admin   acetaminophen (TYLENOL) tablet 650 mg  650 mg Oral Q6H PRN Marylyn Ishihara, Tyrone A, DO   650 mg at 02/28/22 1954   Or   acetaminophen (TYLENOL) suppository 650 mg  650 mg Rectal Q6H PRN Marylyn Ishihara, Tyrone A, DO       amLODipine (NORVASC) tablet 10 mg  10 mg Oral Daily Samuella Cota, MD   10 mg at 03/06/22 1020   aspirin EC tablet 81 mg  81 mg Oral Daily Samuella Cota, MD   81 mg at 03/06/22 1020   feeding supplement (ENSURE ENLIVE / ENSURE PLUS) liquid 237 mL  237 mL Oral TID BM Samuella Cota, MD   237 mL at 03/06/22 1021   guaiFENesin (ROBITUSSIN) 100 MG/5ML liquid 5 mL  5 mL Oral Q4H PRN Marylyn Ishihara, Tyrone A, DO       hydrALAZINE (APRESOLINE) injection 10 mg  10 mg Intravenous Q6H PRN Rai, Ripudeep K, MD       ipratropium-albuterol (DUONEB) 0.5-2.5 (3) MG/3ML nebulizer solution 3 mL  3 mL Nebulization Q6H PRN Marylyn Ishihara, Tyrone A, DO  levalbuterol (XOPENEX) nebulizer solution 0.63 mg  0.63 mg Nebulization BID Samuella Cota, MD   0.63 mg at 03/06/22 1660   morphine (PF) 2 MG/ML injection 1 mg  1 mg Intravenous Q3H PRN Samuella Cota, MD       multivitamin with minerals tablet 1 tablet  1 tablet Oral Daily Samuella Cota, MD   1 tablet at 03/06/22 1020   Oral care mouth rinse  15 mL Mouth Rinse 4 times per day Samuella Cota, MD   15 mL at 03/06/22 1021   penicillin G potassium 12 Million Units in dextrose 5 % 500 mL continuous infusion  12 Million Units Intravenous Q12H Emiliano Dyer, RPH 41.7 mL/hr at 03/06/22 0622 12 Million Units at 03/06/22 0622   QUEtiapine (SEROQUEL) tablet 25 mg  25 mg Oral Q24H Rai, Ripudeep K, MD   25 mg at 03/05/22  2122   zinc sulfate capsule 220 mg  220 mg Oral Daily Samuella Cota, MD   220 mg at 03/06/22 1020     Discharge Medications: Please see discharge summary for a list of discharge medications.  Relevant Imaging Results:  Relevant Lab Results:   Additional Information SSN: [id number]  Leeroy Cha, RN

## 2022-03-07 DIAGNOSIS — J189 Pneumonia, unspecified organism: Secondary | ICD-10-CM | POA: Diagnosis not present

## 2022-03-07 DIAGNOSIS — J90 Pleural effusion, not elsewhere classified: Secondary | ICD-10-CM | POA: Diagnosis not present

## 2022-03-07 DIAGNOSIS — J9601 Acute respiratory failure with hypoxia: Secondary | ICD-10-CM | POA: Diagnosis not present

## 2022-03-07 DIAGNOSIS — J869 Pyothorax without fistula: Secondary | ICD-10-CM | POA: Diagnosis not present

## 2022-03-07 LAB — BASIC METABOLIC PANEL
Anion gap: 8 (ref 5–15)
BUN: 12 mg/dL (ref 8–23)
CO2: 27 mmol/L (ref 22–32)
Calcium: 8.2 mg/dL — ABNORMAL LOW (ref 8.9–10.3)
Chloride: 99 mmol/L (ref 98–111)
Creatinine, Ser: 0.73 mg/dL (ref 0.61–1.24)
GFR, Estimated: >60 mL/min (ref >60)
Glucose, Bld: 100 mg/dL — ABNORMAL HIGH (ref 70–99)
Potassium: 3.4 mmol/L — ABNORMAL LOW (ref 3.5–5.1)
Sodium: 134 mmol/L — ABNORMAL LOW (ref 135–145)

## 2022-03-07 LAB — CBC
HCT: 29.2 % — ABNORMAL LOW (ref 39.0–52.0)
Hemoglobin: 9.3 g/dL — ABNORMAL LOW (ref 13.0–17.0)
MCH: 27.4 pg (ref 26.0–34.0)
MCHC: 31.8 g/dL (ref 30.0–36.0)
MCV: 86.1 fL (ref 80.0–100.0)
Platelets: 360 10*3/uL (ref 150–400)
RBC: 3.39 MIL/uL — ABNORMAL LOW (ref 4.22–5.81)
RDW: 15.5 % (ref 11.5–15.5)
WBC: 22.5 10*3/uL — ABNORMAL HIGH (ref 4.0–10.5)
nRBC: 0 % (ref 0.0–0.2)

## 2022-03-07 MED ORDER — POTASSIUM CHLORIDE CRYS ER 20 MEQ PO TBCR
40.0000 meq | EXTENDED_RELEASE_TABLET | Freq: Once | ORAL | Status: AC
  Administered 2022-03-07: 40 meq via ORAL
  Filled 2022-03-07: qty 2

## 2022-03-07 NOTE — Plan of Care (Signed)
  Problem: Clinical Measurements: Goal: Diagnostic test results will improve Outcome: Progressing Goal: Respiratory complications will improve Outcome: Progressing   Problem: Activity: Goal: Risk for activity intolerance will decrease Outcome: Progressing   Problem: Safety: Goal: Ability to remain free from injury will improve Outcome: Progressing   

## 2022-03-07 NOTE — Progress Notes (Signed)
Mobility Specialist - Progress Note   03/07/22 1541  Mobility  Activity Ambulated with assistance in hallway  Level of Assistance Contact guard assist, steadying assist  Assistive Device Front wheel walker  Distance Ambulated (ft) 250 ft  Range of Motion/Exercises Active  Activity Response Tolerated well  Mobility Referral Yes  $Mobility charge 1 Mobility   Pt was found in bed and wanting to ambulate per RN. Had no complaints during session and at EOS returned to recliner chair with RN in room.   Ferd Hibbs Mobility Specialist

## 2022-03-07 NOTE — Progress Notes (Signed)
Mobility Specialist - Progress Note   03/07/22 1020  Mobility  Activity Ambulated with assistance in hallway  Level of Assistance Contact guard assist, steadying assist  Assistive Device Front wheel walker  Distance Ambulated (ft) 250 ft  Range of Motion/Exercises Active  Activity Response Tolerated well  Mobility Referral Yes  $Mobility charge 1 Mobility   Pt was found on recliner chair and agreeable to try ambulation. Stated having a good night rest last night and feeling better today. Was contact assist during ambulation and required cues for managing the IV line. Upon returning to the room stated needing to have a BM and was assisted to the bathroom. At EOS was left on recliner chair with chair alarm on. Necessities in reach and family in room.  Ferd Hibbs Mobility Specialist

## 2022-03-07 NOTE — Progress Notes (Signed)
[facility]                                                                              David [name], is a 89 y.o. male, DOB - 08-Nov-1933, JTT:[id number] Admit date - 02/26/2022    Outpatient Primary MD for the patient is [name], MD  LOS - 9  days  Chief Complaint  Patient presents with   Code Sepsis       Brief summary   Patient is a 89 year old male with CKD stage IIIa, HTN, anemia, presented with cough and shortness of breath.  Recent hospitalization for pneumonia at [facility], improved and subsequently developed more shortness of breath on 1/4.  Was found to be hypoxic in ED requiring 4 L O2 I Chest x-ray demonstrated left-sided infiltrate, CTA showed left-sided effusion and total atelectasis left lower lobe. Seen by pulmonology and chest tube placed. Treated with fibrinolytics with improvement.   Procedures 1/4 Chest Tube Insertion. Pt accidentally pulled out. 1/5 Chest Tube Insertion 1/6 Pleural Fibrinolysis Initial day  1/7 Pleural Fibrinolysis Second day  1/8 Pleural Fibrinolysis Third day. Prognosis guarded. Clinically worse. Code status DNR. 1/10: Chest tube removed 1/11: Continues to have agitation with sundowning and delirium 1/13: Delirium resolved, alert and oriented, fairly close to his baseline  Assessment & Plan    Principal Problem: Acute hypoxic respiratory failure with community acquired pneumonia of left lower lobe of lung Left empyema -Initial chest CT showed complete opacification of the left lower lobe, left lower lobe bronchus occlusion, moderate debris's in trachea -PCCM was consulted, status post 3 days of lytics, completed 1/8 -Pleural fluid culture with strep intermedius, currently on IV penicillin.  Blood cultures negative -Repeat chest CT showed marked reduction in effusion, persistent airspace disease left lower lobe, increase in size of loculated fissural fluid in the left mid chest. -Chest tube removed on 1/10 -CCM  recommended Augmentin as outpatient for 4 weeks total course -Improving, O2 sats 96% on room air -Passed SLP eval, leukocytosis improving, procalcitonin 0.16  Active Problems: Acute metabolic encephalopathy, has underlying dementia with sundowning -Continues to have issues with delirium and sundowning, worse at night -Avoid Ativan, had paradoxical effect on 1/10 -CT head negative for any stroke, stable mild atrophy and chronic microvascular ischemic changes -Zyprexa did not help much, started on Seroquel 25 mg nightly, will continue.    Acute on stage 3a chronic kidney disease (CKD) (H[facility]) -Baseline creatinine 1.3 in 08/2017 -Worsened to 1.6 on 03/02/2022, resolved     HTN (hypertension) -BP stable continue amlodipine      Normocytic anemia -H&H low, 9.3  Hypokalemia -Potassium 3.4, will replace    Protein-calorie malnutrition, severe Nutrition Problem: Severe Malnutrition Etiology: acute illness (CAP) Signs/Symptoms: moderate fat depletion, severe muscle depletion, energy intake < or equal to 50% for > or equal to 1 month, percent weight loss Interventions: Ensure Enlive (each supplement provides 350kcal and 20 grams of protein), Magic cup, MVI  Estimated body mass index is 22.93 kg/m as calculated from the following:   Height as of this encounter: 6' (1.829 m).   Weight as of this encounter: 76.7 kg.  Code Status: DNR DVT Prophylaxis:  SCDs Start: 02/27/22 0402   Level of Care: Level of care: Progressive Family Communication: Updated patient's daughter at the bedside today   Disposition Plan:      Remains inpatient appropriate: Family reported that patient lives with his 51 year old wife who is fragile herself and not able to assist with ADLs.  With his dementia, sundowning, family is apprehensive of safety of his wife as well.  Family cannot provide 24/7 care/supervision, requesting SNF.  TOC consulted.  Possible DC on Monday 1/15 if bed available at SNF   Procedures:   Chest tube  Consultants:   PCCM  Antimicrobials:   Anti-infectives (From admission, onward)    Start     Dose/Rate Route Frequency Ordered Stop   03/04/22 1800  penicillin G potassium 12 Million Units in dextrose 5 % 500 mL continuous infusion        12 Million Units 41.7 mL/hr over 12 Hours Intravenous Every 12 hours 03/04/22 1258     03/03/22 1800  vancomycin (VANCOREADY) IVPB 1750 mg/350 mL  Status:  Discontinued        1,750 mg 175 mL/hr over 120 Minutes Intravenous Every 48 hours 03/02/22 0823 03/02/22 1111   03/02/22 1800  penicillin G potassium 4 Million Units in dextrose 5 % 250 mL IVPB  Status:  Discontinued        4 Million Units 250 mL/hr over 60 Minutes Intravenous Every 6 hours 03/02/22 1113 03/04/22 1257   02/27/22 1800  piperacillin-tazobactam (ZOSYN) IVPB 3.375 g  Status:  Discontinued        3.375 g 12.5 mL/hr over 240 Minutes Intravenous Every 8 hours 02/27/22 1645 03/02/22 1111   02/27/22 1600  vancomycin (VANCOREADY) IVPB 1250 mg/250 mL  Status:  Discontinued        1,250 mg 166.7 mL/hr over 90 Minutes Intravenous Every 24 hours 02/27/22 1133 03/02/22 0823   02/27/22 1200  cefTRIAXone (ROCEPHIN) 2 g in sodium chloride 0.9 % 100 mL IVPB  Status:  Discontinued        2 g 200 mL/hr over 30 Minutes Intravenous Every 24 hours 02/27/22 1103 02/27/22 1639   02/27/22 1200  azithromycin (ZITHROMAX) 500 mg in sodium chloride 0.9 % 250 mL IVPB  Status:  Discontinued        500 mg 250 mL/hr over 60 Minutes Intravenous Every 24 hours 02/27/22 1103 02/27/22 1639   02/27/22 1115  vancomycin (VANCOREADY) IVPB 1500 mg/300 mL  Status:  Discontinued        1,500 mg 150 mL/hr over 120 Minutes Intravenous  Once 02/27/22 1112 02/27/22 1112   02/26/22 1500  vancomycin (VANCOREADY) IVPB 1500 mg/300 mL        1,500 mg 150 mL/hr over 120 Minutes Intravenous  Once 02/26/22 1457 02/26/22 1850   02/26/22 1445  vancomycin (VANCOCIN) IVPB 1000 mg/200 mL premix  Status:  Discontinued         1,000 mg 200 mL/hr over 60 Minutes Intravenous  Once 02/26/22 1438 02/26/22 1457   02/26/22 1445  ceFEPIme (MAXIPIME) 2 g in sodium chloride 0.9 % 100 mL IVPB        2 g 200 mL/hr over 30 Minutes Intravenous  Once 02/26/22 1438 02/26/22 1745   02/26/22 1400  cefTRIAXone (ROCEPHIN) 1 g in sodium chloride 0.9 % 100 mL IVPB  Status:  Discontinued        1 g 200 mL/hr over 30 Minutes Intravenous  Once 02/26/22 1355 02/26/22 1435  02/26/22 1400  azithromycin (ZITHROMAX) 500 mg in sodium chloride 0.9 % 250 mL IVPB  Status:  Discontinued        500 mg 250 mL/hr over 60 Minutes Intravenous  Once 02/26/22 1355 02/26/22 1435          Medications  amLODipine  10 mg Oral Daily   aspirin EC  81 mg Oral Daily   feeding supplement  237 mL Oral TID BM   levalbuterol  0.63 mg Nebulization BID   multivitamin with minerals  1 tablet Oral Daily   mouth rinse  15 mL Mouth Rinse 4 times per day   QUEtiapine  25 mg Oral Q24H   zinc sulfate  220 mg Oral Daily      Subjective:   David [name] was seen and examined today.  Sitting up in the chair, alert and oriented x 3, daughter at the bedside.  She stated that patient had a better sleep last night.  No acute issues.  Objective:   Vitals:   03/06/22 2051 03/07/22 0516 03/07/22 0830 03/07/22 0834  BP: (!) 138/52 (!) 148/54  (!) 153/59  Pulse: 65 63  61  Resp: 19 20    Temp: 98.7 F (37.1 C) 97.8 F (36.6 C)    TempSrc: Oral Oral    SpO2: 94% 92% 90%   Weight:      Height:        Intake/Output Summary (Last 24 hours) at 03/07/2022 1329 Last data filed at 03/07/2022 0900 Gross per 24 hour  Intake 360 ml  Output 150 ml  Net 210 ml     Wt Readings from Last 3 Encounters:  02/27/22 76.7 kg  09/09/17 93 kg  11/21/16 88.5 kg   Physical Exam General: Alert and oriented x 3, NAD, sitting up in the chair Cardiovascular: S1 S2 clear, RRR.  Respiratory: CTAB, no wheezing, rales or rhonchi Gastrointestinal: Soft, nontender, nondistended,  NBS Ext: no pedal edema bilaterally Neuro: no new deficits Psych: Normal affect, pleasant    Data Reviewed:  I have personally reviewed following labs    CBC Lab Results  Component Value Date   WBC 22.5 (H) 03/07/2022   RBC 3.39 (L) 03/07/2022   HGB 9.3 (L) 03/07/2022   HCT 29.2 (L) 03/07/2022   MCV 86.1 03/07/2022   MCH 27.4 03/07/2022   PLT 360 03/07/2022   MCHC 31.8 03/07/2022   RDW 15.5 03/07/2022   LYMPHSABS 0.3 (L) 02/26/2022   MONOABS 2.7 (H) 02/26/2022   EOSABS 0.0 02/26/2022   BASOSABS 0.1 02/58/5277     Last metabolic panel Lab Results  Component Value Date   NA 134 (L) 03/07/2022   K 3.4 (L) 03/07/2022   CL 99 03/07/2022   CO2 27 03/07/2022   BUN 12 03/07/2022   CREATININE 0.73 03/07/2022   GLUCOSE 100 (H) 03/07/2022   GFRNONAA >60 03/07/2022   GFRAA 54 (L) 09/09/2017   CALCIUM 8.2 (L) 03/07/2022   PROT 6.0 (L) 02/27/2022   ALBUMIN 2.5 (L) 02/27/2022   BILITOT 0.6 02/27/2022   ALKPHOS 83 02/27/2022   AST 40 02/27/2022   ALT 32 02/27/2022   ANIONGAP 8 03/07/2022    CBG (last 3)  No results for input(s): "GLUCAP" in the last 72 hours.    Coagulation Profile: No results for input(s): "INR", "PROTIME" in the last 168 hours.    Radiology Studies: I have personally reviewed the imaging studies  No results found.     Estill Cotta M.D. [facility]  03/07/2022, 1:29 PM  Available via Epic secure chat 7am-7pm After 7 pm, please refer to night coverage provider listed on amion.

## 2022-03-07 NOTE — TOC Progression Note (Signed)
Transition of Care [facility]) - Progression Note    Patient Details  Name: NYLE LIMB MRN: [id number] Date of Birth: 08-28-1933  [facility] CM/SW Contact  Henrietta Dine, RN Phone Number: 03/07/2022, 6:05 PM  Clinical Narrative:    Notified pt's dtr would like to speak w/ TOC; contacted pt's dtr Carlynn Purl [phone]); she requested that note from OT be removed from chart; explained this CM can not remove another clinician's documentation; she expressed concern that their recommendation could negatively affect insurance paying for snf; and she was instructed to contact this dept; explained to Ms Brinson PT's recommendation is snf and pt has been faxed out for bed offers; explained SNF process and bed offers will be presented and ins Josem Kaufmann will be required once choice is made; she verbalized understanding; she verbalized understanding; TOC will follow.    Expected Discharge Plan: [place] Barriers to Discharge: Continued Medical Work up  Expected Discharge Plan and Services   Discharge Planning Services: CM Consult Post Acute Care Choice: [place] arrangements for the past 2 months: Single Family Home                                       Social Determinants of Health (SDOH) Interventions SDOH Screenings   Food Insecurity: No Food Insecurity (02/27/2022)  Housing: Low Risk  (02/27/2022)  Transportation Needs: No Transportation Needs (02/27/2022)  Utilities: Not At Risk (02/27/2022)  Tobacco Use: Medium Risk (02/26/2022)    Readmission Risk Interventions     No data to display

## 2022-03-08 DIAGNOSIS — I10 Essential (primary) hypertension: Secondary | ICD-10-CM

## 2022-03-08 DIAGNOSIS — J189 Pneumonia, unspecified organism: Secondary | ICD-10-CM | POA: Diagnosis not present

## 2022-03-08 DIAGNOSIS — J90 Pleural effusion, not elsewhere classified: Secondary | ICD-10-CM | POA: Diagnosis not present

## 2022-03-08 DIAGNOSIS — J9601 Acute respiratory failure with hypoxia: Secondary | ICD-10-CM | POA: Diagnosis not present

## 2022-03-08 DIAGNOSIS — J869 Pyothorax without fistula: Secondary | ICD-10-CM | POA: Diagnosis not present

## 2022-03-08 LAB — CREATININE, SERUM
Creatinine, Ser: 0.92 mg/dL (ref 0.61–1.24)
GFR, Estimated: >60 mL/min (ref >60)

## 2022-03-08 NOTE — Plan of Care (Signed)
  Problem: Respiratory: Goal: Ability to maintain a clear airway will improve Outcome: Progressing   Problem: Clinical Measurements: Goal: Ability to maintain clinical measurements within normal limits will improve Outcome: Progressing Goal: Will remain free from infection Outcome: Progressing   Problem: Coping: Goal: Level of anxiety will decrease Outcome: Progressing

## 2022-03-08 NOTE — Progress Notes (Signed)
[facility]                                                                              David [name], is a 89 y.o. male, DOB - 1933/05/07, VOZ:[id number] Admit date - 02/26/2022    Outpatient Primary MD for the patient is Lenna Gilford, Allen Norris, MD  LOS - 10  days  Chief Complaint  Patient presents with   Code Sepsis       Brief summary   Patient is a 89 year old male with CKD stage IIIa, HTN, anemia, presented with cough and shortness of breath.  Recent hospitalization for pneumonia at [facility], improved and subsequently developed more shortness of breath on 1/4.  Was found to be hypoxic in ED requiring 4 L O2 I Chest x-ray demonstrated left-sided infiltrate, CTA showed left-sided effusion and total atelectasis left lower lobe. Seen by pulmonology and chest tube placed. Treated with fibrinolytics with improvement.   Procedures 1/4 Chest Tube Insertion. Pt accidentally pulled out. 1/5 Chest Tube Insertion 1/6 Pleural Fibrinolysis Initial day  1/7 Pleural Fibrinolysis Second day  1/8 Pleural Fibrinolysis Third day. Prognosis guarded. Clinically worse. Code status DNR. 1/10: Chest tube removed 1/11: Continues to have agitation with sundowning and delirium 1/13: Delirium resolved, alert and oriented, fairly close to his baseline  Assessment & Plan    Principal Problem: Acute hypoxic respiratory failure with community acquired pneumonia of left lower lobe of lung Left empyema -Initial chest CT showed complete opacification of the left lower lobe, left lower lobe bronchus occlusion, moderate debris's in trachea -PCCM was consulted, status post 3 days of lytics, completed 1/8 -Pleural fluid culture with strep intermedius, currently on IV penicillin.  Blood cultures negative -Repeat chest CT showed marked reduction in effusion, persistent airspace disease left lower lobe, increase in size of loculated fissural fluid in the left mid chest. -Chest tube removed on 1/10 -CCM  recommended Augmentin as outpatient for 4 weeks total course -Currently stable, O2 sats 95% on room air  Active Problems: Acute metabolic encephalopathy, has underlying dementia with sundowning -Avoid Ativan, had paradoxical effect on 1/10 -CT head negative for any stroke, stable mild atrophy and chronic microvascular ischemic changes -Zyprexa did not help much, started on Seroquel 25 mg nightly -Resolved, currently alert and oriented x 3 at baseline mental status, confirmed with family at the bedside  Acute on stage 3a chronic kidney disease (CKD) ([place]) -Baseline creatinine 1.3 in 08/2017 -Worsened to 1.6 on 03/02/2022 -Resolved, creatinine 0.9     HTN (hypertension) -Continue amlodipine     Normocytic anemia -H&H low, 9.3  Hypokalemia -Recheck bmet in a.m.  Leukocytosis Admitted with leukocytosis, due to #1, WBCs 44.5K on admission, improved to 22.5 today -Blood cultures 1/11 has remained negative, urine culture negative, pleural fluid culture positive for Streptococcus intermedius  -currently on IV penicillin     Protein-calorie malnutrition, severe Nutrition Problem: Severe Malnutrition Etiology: acute illness (CAP) Signs/Symptoms: moderate fat depletion, severe muscle depletion, energy intake < or equal to 50% for > or equal to 1 month, percent weight loss Interventions: Ensure Enlive (each supplement provides 350kcal and 20 grams of protein), Magic cup, MVI  Estimated body mass index is 22.93 kg/m as calculated from the following:   Height as of this encounter: 6' (1.829 m).   Weight as of this encounter: 76.7 kg.  Code Status: DNR DVT Prophylaxis:  SCDs Start: 02/27/22 0402   Level of Care: Level of care: Progressive Family Communication: Updated patient's son-in-law at the bedside   Disposition Plan:      Remains inpatient appropriate: Family reported that patient lives with his 110 year old wife who is fragile herself and not able to assist with ADLs.  With his  dementia, sundowning, family is apprehensive of safety of his wife as well.  Family cannot provide 24/7 care/supervision, requesting SNF.  TOC consulted.  Possible DC on Monday 1/15 if bed available at SNF   Procedures:  Chest tube  Consultants:   PCCM  Antimicrobials:   Anti-infectives (From admission, onward)    Start     Dose/Rate Route Frequency Ordered Stop   03/04/22 1800  penicillin G potassium 12 Million Units in dextrose 5 % 500 mL continuous infusion        12 Million Units 41.7 mL/hr over 12 Hours Intravenous Every 12 hours 03/04/22 1258     03/03/22 1800  vancomycin (VANCOREADY) IVPB 1750 mg/350 mL  Status:  Discontinued        1,750 mg 175 mL/hr over 120 Minutes Intravenous Every 48 hours 03/02/22 0823 03/02/22 1111   03/02/22 1800  penicillin G potassium 4 Million Units in dextrose 5 % 250 mL IVPB  Status:  Discontinued        4 Million Units 250 mL/hr over 60 Minutes Intravenous Every 6 hours 03/02/22 1113 03/04/22 1257   02/27/22 1800  piperacillin-tazobactam (ZOSYN) IVPB 3.375 g  Status:  Discontinued        3.375 g 12.5 mL/hr over 240 Minutes Intravenous Every 8 hours 02/27/22 1645 03/02/22 1111   02/27/22 1600  vancomycin (VANCOREADY) IVPB 1250 mg/250 mL  Status:  Discontinued        1,250 mg 166.7 mL/hr over 90 Minutes Intravenous Every 24 hours 02/27/22 1133 03/02/22 0823   02/27/22 1200  cefTRIAXone (ROCEPHIN) 2 g in sodium chloride 0.9 % 100 mL IVPB  Status:  Discontinued        2 g 200 mL/hr over 30 Minutes Intravenous Every 24 hours 02/27/22 1103 02/27/22 1639   02/27/22 1200  azithromycin (ZITHROMAX) 500 mg in sodium chloride 0.9 % 250 mL IVPB  Status:  Discontinued        500 mg 250 mL/hr over 60 Minutes Intravenous Every 24 hours 02/27/22 1103 02/27/22 1639   02/27/22 1115  vancomycin (VANCOREADY) IVPB 1500 mg/300 mL  Status:  Discontinued        1,500 mg 150 mL/hr over 120 Minutes Intravenous  Once 02/27/22 1112 02/27/22 1112   02/26/22 1500   vancomycin (VANCOREADY) IVPB 1500 mg/300 mL        1,500 mg 150 mL/hr over 120 Minutes Intravenous  Once 02/26/22 1457 02/26/22 1850   02/26/22 1445  vancomycin (VANCOCIN) IVPB 1000 mg/200 mL premix  Status:  Discontinued        1,000 mg 200 mL/hr over 60 Minutes Intravenous  Once 02/26/22 1438 02/26/22 1457   02/26/22 1445  ceFEPIme (MAXIPIME) 2 g in sodium chloride 0.9 % 100 mL IVPB        2 g 200 mL/hr over 30 Minutes Intravenous  Once 02/26/22 1438 02/26/22 1745   02/26/22 1400  cefTRIAXone (ROCEPHIN) 1 g in sodium chloride  0.9 % 100 mL IVPB  Status:  Discontinued        1 g 200 mL/hr over 30 Minutes Intravenous  Once 02/26/22 1355 02/26/22 1435   02/26/22 1400  azithromycin (ZITHROMAX) 500 mg in sodium chloride 0.9 % 250 mL IVPB  Status:  Discontinued        500 mg 250 mL/hr over 60 Minutes Intravenous  Once 02/26/22 1355 02/26/22 1435          Medications  amLODipine  10 mg Oral Daily   aspirin EC  81 mg Oral Daily   feeding supplement  237 mL Oral TID BM   multivitamin with minerals  1 tablet Oral Daily   mouth rinse  15 mL Mouth Rinse 4 times per day   QUEtiapine  25 mg Oral Q24H   zinc sulfate  220 mg Oral Daily      Subjective:   David [name] was seen and examined today.  Sitting up in the chair, feels good, no acute complaints.  Mental status at baseline, confirmed by son-in-law at the bedside.  Slept well last night.  Objective:   Vitals:   03/07/22 2108 03/08/22 0632 03/08/22 0924 03/08/22 1204  BP:  (!) 133/45 (!) 121/42 (!) 148/53  Pulse:  60  (!) 57  Resp:  20  18  Temp:  97.7 F (36.5 C)  98.4 F (36.9 C)  TempSrc:  Oral  Oral  SpO2: 93% 93%  95%  Weight:      Height:        Intake/Output Summary (Last 24 hours) at 03/08/2022 1258 Last data filed at 03/08/2022 3810 Gross per 24 hour  Intake 360 ml  Output 1350 ml  Net -990 ml     Wt Readings from Last 3 Encounters:  02/27/22 76.7 kg  09/09/17 93 kg  11/21/16 88.5 kg   Physical  Exam General: Alert and oriented x 3, NAD Cardiovascular: S1 S2 clear, RRR.  Respiratory: CTAB, no wheezing Gastrointestinal: Soft, nontender, nondistended, NBS Ext: no pedal edema bilaterally Neuro: no new deficits Psych: Normal affect     Data Reviewed:  I have personally reviewed following labs    CBC Lab Results  Component Value Date   WBC 22.5 (H) 03/07/2022   RBC 3.39 (L) 03/07/2022   HGB 9.3 (L) 03/07/2022   HCT 29.2 (L) 03/07/2022   MCV 86.1 03/07/2022   MCH 27.4 03/07/2022   PLT 360 03/07/2022   MCHC 31.8 03/07/2022   RDW 15.5 03/07/2022   LYMPHSABS 0.3 (L) 02/26/2022   MONOABS 2.7 (H) 02/26/2022   EOSABS 0.0 02/26/2022   BASOSABS 0.1 17/51/0258     Last metabolic panel Lab Results  Component Value Date   NA 134 (L) 03/07/2022   K 3.4 (L) 03/07/2022   CL 99 03/07/2022   CO2 27 03/07/2022   BUN 12 03/07/2022   CREATININE 0.92 03/08/2022   GLUCOSE 100 (H) 03/07/2022   GFRNONAA >60 03/08/2022   GFRAA 54 (L) 09/09/2017   CALCIUM 8.2 (L) 03/07/2022   PROT 6.0 (L) 02/27/2022   ALBUMIN 2.5 (L) 02/27/2022   BILITOT 0.6 02/27/2022   ALKPHOS 83 02/27/2022   AST 40 02/27/2022   ALT 32 02/27/2022   ANIONGAP 8 03/07/2022    CBG (last 3)  No results for input(s): "GLUCAP" in the last 72 hours.    Coagulation Profile: No results for input(s): "INR", "PROTIME" in the last 168 hours.    Radiology Studies: I have personally reviewed the imaging  studies  No results found.     Estill Cotta M.D. [facility] 03/08/2022, 12:58 PM  Available via Epic secure chat 7am-7pm After 7 pm, please refer to night coverage provider listed on amion.

## 2022-03-09 DIAGNOSIS — J9601 Acute respiratory failure with hypoxia: Secondary | ICD-10-CM | POA: Diagnosis not present

## 2022-03-09 DIAGNOSIS — J189 Pneumonia, unspecified organism: Secondary | ICD-10-CM | POA: Diagnosis not present

## 2022-03-09 DIAGNOSIS — J869 Pyothorax without fistula: Secondary | ICD-10-CM | POA: Diagnosis not present

## 2022-03-09 DIAGNOSIS — J90 Pleural effusion, not elsewhere classified: Secondary | ICD-10-CM | POA: Diagnosis not present

## 2022-03-09 LAB — BASIC METABOLIC PANEL
Anion gap: 7 (ref 5–15)
BUN: 11 mg/dL (ref 8–23)
CO2: 29 mmol/L (ref 22–32)
Calcium: 8.1 mg/dL — ABNORMAL LOW (ref 8.9–10.3)
Chloride: 100 mmol/L (ref 98–111)
Creatinine, Ser: 0.88 mg/dL (ref 0.61–1.24)
GFR, Estimated: >60 mL/min (ref >60)
Glucose, Bld: 98 mg/dL (ref 70–99)
Potassium: 3.8 mmol/L (ref 3.5–5.1)
Sodium: 136 mmol/L (ref 135–145)

## 2022-03-09 LAB — CBC
HCT: 28.3 % — ABNORMAL LOW (ref 39.0–52.0)
Hemoglobin: 9.1 g/dL — ABNORMAL LOW (ref 13.0–17.0)
MCH: 27.6 pg (ref 26.0–34.0)
MCHC: 32.2 g/dL (ref 30.0–36.0)
MCV: 85.8 fL (ref 80.0–100.0)
Platelets: 348 10*3/uL (ref 150–400)
RBC: 3.3 MIL/uL — ABNORMAL LOW (ref 4.22–5.81)
RDW: 15.5 % (ref 11.5–15.5)
WBC: 20 10*3/uL — ABNORMAL HIGH (ref 4.0–10.5)
nRBC: 0 % (ref 0.0–0.2)

## 2022-03-09 LAB — COPPER, SERUM: Copper: 105 ug/dL (ref 69–132)

## 2022-03-09 MED ORDER — IPRATROPIUM-ALBUTEROL 0.5-2.5 (3) MG/3ML IN SOLN: 3.0000 mL | Freq: Four times a day (QID) | RESPIRATORY_TRACT | Status: DC | PRN

## 2022-03-09 MED ORDER — PROSOURCE PLUS PO LIQD
30.0000 mL | Freq: Two times a day (BID) | ORAL | Status: DC
  Administered 2022-03-10: 30 mL via ORAL
  Filled 2022-03-09 (×2): qty 30

## 2022-03-09 MED ORDER — QUETIAPINE FUMARATE 25 MG PO TABS: 25.0000 mg | ORAL_TABLET | ORAL | Status: DC

## 2022-03-09 MED ORDER — GUAIFENESIN 100 MG/5ML PO LIQD: 5.0000 mL | ORAL | 0 refills | Status: DC | PRN

## 2022-03-09 MED ORDER — AMOXICILLIN-POT CLAVULANATE 875-125 MG PO TABS: 1.0000 | ORAL_TABLET | Freq: Two times a day (BID) | ORAL | Status: AC

## 2022-03-09 MED ORDER — AMOXICILLIN-POT CLAVULANATE 875-125 MG PO TABS
1.0000 | ORAL_TABLET | Freq: Two times a day (BID) | ORAL | Status: DC
  Administered 2022-03-09 – 2022-03-10 (×3): 1 via ORAL
  Filled 2022-03-09 (×3): qty 1

## 2022-03-09 NOTE — Progress Notes (Signed)
Physical Therapy Treatment Patient Details Name: AD GUTTMAN MRN: [id number] DOB: 12-Mar-1933 Today's Date: 03/09/2022   History of Present Illness Patient is a 89 year old male who presented with SOB and cough. Patient was found to have L side effusion, left empyema with marked leukocytosis, pneumonia, and acute hypoxic respiratory failure. Patient had chest tube insertion on 1/4. 1/6 and 1/7 pleural fibrinolysis. PMH: CKD III, HTN, anemia.  General Comments: AxO x 2 pleasant/few words.  Retired from the [employer]. "43 years" stated. Pt OOB in recliner.  General transfer comment: 50% VC's on proper hand placement and safety with turns. Unsteadiness with turns and back steps.  Slow moving.  Delayed balance coorection.  HIGH FALL RISK.  General Gait Details: pt required Mod/Min Assist with walker at 50% VC's on proper walker to self distance as well as safety with turns.  Slow gait.  Shuffled steps.  Poor forward flex posture.  Limited distance due to mild dyspnea and Max c/o fatigue.HIGH FALL RISK  Pt will need ST Rehab at SNF to address mobility and functional decline prior to safely returning home.   PT Comments      Recommendations for follow up therapy are one component of a multi-disciplinary discharge planning process, led by the attending physician.  Recommendations may be updated based on patient status, additional functional criteria and insurance authorization.  Follow Up Recommendations  Skilled nursing-short term rehab (<3 hours/day) Can patient physically be transported by private vehicle: Yes   Assistance Recommended at Discharge Frequent or constant Supervision/Assistance  Patient can return home with the following A lot of help with walking and/or transfers;A lot of help with bathing/dressing/bathroom;Help with stairs or ramp for entrance;Assist for transportation;Assistance with cooking/housework   Equipment Recommendations  Rolling walker (2 wheels)    Recommendations  for Other Services       Precautions / Restrictions Precautions Precautions: Fall Restrictions Weight Bearing Restrictions: No     Mobility  Bed Mobility               General bed mobility comments: Pt OOB in recliner    Transfers Overall transfer level: Needs assistance Equipment used: Rolling walker (2 wheels) Transfers: Sit to/from Stand, Bed to chair/wheelchair/BSC Sit to Stand: Min assist, Mod assist           General transfer comment: 50% VC's on proper hand placement and safety with turns. Unsteadiness with turns and back steps.  Slow moving.  Delayed balance coorection.  HIGH FALL RISK.    Ambulation/Gait Ambulation/Gait assistance: Min assist, Mod assist Gait Distance (Feet): 52 Feet Assistive device: Rolling walker (2 wheels) Gait Pattern/deviations: Step-through pattern, Decreased stride length, Trunk flexed, Shuffle Gait velocity: decreased     General Gait Details: pt required Mod/Min Assist with walker at 50% VC's on proper walker to self distance as well as safety with turns.  Slow gait.  Shuffled steps.  Poor forward flex posture.  Limited distance due to mild dyspnea and Max c/o fatigue.HIGH FALL RISK   Stairs             Wheelchair Mobility    Modified Rankin (Stroke Patients Only)       Balance                                            Cognition Arousal/Alertness: Awake/alert Behavior During Therapy: WFL for tasks assessed/performed Overall  Cognitive Status: Within Functional Limits for tasks assessed                                 General Comments: AxO x 2 pleasant/few words.  Retired from the [employer]. "43 years" stated.        Exercises      General Comments        Pertinent Vitals/Pain Pain Assessment Pain Assessment: No/denies pain Pain Location: "not really" stated pt    Home Living                          Prior Function            PT Goals (current  goals can now be found in the care plan section) Progress towards PT goals: Progressing toward goals    Frequency    Min 3X/week      PT Plan Current plan remains appropriate    Co-evaluation              AM-PAC PT "6 Clicks" Mobility   Outcome Measure  Help needed turning from your back to your side while in a flat bed without using bedrails?: A Lot Help needed moving from lying on your back to sitting on the side of a flat bed without using bedrails?: A Lot Help needed moving to and from a bed to a chair (including a wheelchair)?: A Lot Help needed standing up from a chair using your arms (e.g., wheelchair or bedside chair)?: A Lot Help needed to walk in hospital room?: A Lot Help needed climbing 3-5 steps with a railing? : A Lot 6 Click Score: 12    End of Session Equipment Utilized During Treatment: Gait belt Activity Tolerance: Patient limited by fatigue Patient left: in chair;with call bell/phone within reach;with chair alarm set;with family/visitor present Nurse Communication: Mobility status PT Visit Diagnosis: Muscle weakness (generalized) (M62.81);Difficulty in walking, not elsewhere classified (R26.2)     Time: 8101-7510 PT Time Calculation (min) (ACUTE ONLY): 13 min  Charges:  $Gait Training: 8-22 mins                     {Zayne Marovich  PTA Acute  [employer] M-F          [phone] Weekend pager [phone]

## 2022-03-09 NOTE — Progress Notes (Signed)
Nutrition Follow-up  DOCUMENTATION CODES:   Severe malnutrition in context of acute illness/injury  INTERVENTION:  - Regular diet.  - Continue Magic cup TID with meals, each supplement provides 290 kcal and 9 grams of protein  - Add Prosource Plus BID, each supplement provides 100 kcal and 15 grams of protein. - Multivitamin with minerals daily - Monitor weight trends.   NUTRITION DIAGNOSIS:   Severe Malnutrition related to acute illness (CAP) as evidenced by moderate fat depletion, severe muscle depletion, energy intake < or equal to 50% for > or equal to 1 month, percent weight loss. *ongoing  GOAL:   Patient will meet greater than or equal to 90% of their needs *unmet  MONITOR:   PO intake, Supplement acceptance, Weight trends, Labs, I & O's  REASON FOR ASSESSMENT:   Consult Assessment of nutrition requirement/status  ASSESSMENT:   89 year old man presenting with cough and shortness of breath.  PMH recent hospitalization for pneumonia at [facility] who improved and then subsequently developed more shortness of breath 1/4, emergency department was found to be hypoxic requiring 4 L nasal cannula, significant leukocytosis.  Chest x-ray demonstrated left-sided infiltrate, CTA showed left-sided effusion and total atelectasis left lower lobe.  Seen by pulmonology and chest tube placed.  Patient consuming 25-75% of meals over the past week. However, he has been refusing Ensure supplements. Will adjust supplements to promote intake. Patient is due for a new weight. No new weight since admit to assess for changes.   Medications reviewed and include: MVI, '220mg'$  zinc  Labs reviewed:  -   Diet Order:   Diet Order             Diet - low sodium heart healthy           Diet regular Room service appropriate? Yes; Fluid consistency: Thin  Diet effective now                   EDUCATION NEEDS:  No education needs have been identified at this time  Skin:  Skin  Assessment: Reviewed RN Assessment  Last BM:  1/14  Height:  Ht Readings from Last 1 Encounters:  02/27/22 6' (1.829 m)   Weight:  Wt Readings from Last 1 Encounters:  02/27/22 76.7 kg    BMI:  Body mass index is 22.93 kg/m.  Estimated Nutritional Needs:  Kcal:  2200-2400 Protein:  100-115g Fluid:  2.2L/day    David [name] RD, LDN For contact information, refer to [facility].

## 2022-03-09 NOTE — Progress Notes (Signed)
[facility]                                                                              David [name], is a 89 y.o. male, DOB - 1933/03/05, TIW:[id number] Admit date - 02/26/2022    Outpatient Primary MD for the patient is [name], MD  LOS - 11  days  Chief Complaint  Patient presents with   Code Sepsis       Brief summary   Patient is a 89 year old male with CKD stage IIIa, HTN, anemia, presented with cough and shortness of breath.  Recent hospitalization for pneumonia at [facility], improved and subsequently developed more shortness of breath on 1/4.  Was found to be hypoxic in ED requiring 4 L O2 I Chest x-ray demonstrated left-sided infiltrate, CTA showed left-sided effusion and total atelectasis left lower lobe. Seen by pulmonology and chest tube placed. Treated with fibrinolytics with improvement.   Procedures 1/4 Chest Tube Insertion. Pt accidentally pulled out. 1/5 Chest Tube Insertion 1/6 Pleural Fibrinolysis Initial day  1/7 Pleural Fibrinolysis Second day  1/8 Pleural Fibrinolysis Third day. Prognosis guarded. Clinically worse. Code status DNR. 1/10: Chest tube removed 1/11: Continues to have agitation with sundowning and delirium 1/13: Delirium resolved, alert and oriented, fairly close to his baseline  Assessment & Plan    Principal Problem: Acute hypoxic respiratory failure with community acquired pneumonia of left lower lobe of lung Left empyema -Initial chest CT showed complete opacification of the left lower lobe, left lower lobe bronchus occlusion, moderate debris's in trachea -PCCM was consulted, status post 3 days of lytics, completed 1/8 -Pleural fluid culture with strep intermedius, currently on IV penicillin.  Blood cultures negative -Repeat chest CT showed marked reduction in effusion, persistent airspace disease left lower lobe, increase in size of loculated fissural fluid in the left mid chest. -Chest tube removed on 1/10 -DC  IV penicillin, transition to oral Augmentin, for total 4 weeks course as recommended by CCM -Improving, O2 sats 94% on room air  Active Problems: Acute metabolic encephalopathy, has underlying dementia with sundowning -Avoid Ativan, had paradoxical effect on 1/10 -CT head negative for any stroke, stable mild atrophy and chronic microvascular ischemic changes -Zyprexa did not help much, started on Seroquel 25 mg nightly -Resolved, currently at baseline alert and oriented x 3  Acute on stage 3a chronic kidney disease (CKD) (HCC) -Baseline creatinine 1.3 in 08/2017 -Worsened to 1.6 on 03/02/2022 -Resolved     HTN (hypertension) -Continue amlodipine     Normocytic anemia -H&H stable, 9.1  Hypokalemia -K3.8  Leukocytosis Admitted with leukocytosis, due to #1, WBCs 44.5K on admission -Blood cultures 1/11 has remained negative, urine culture negative, pleural fluid culture positive for Streptococcus intermedius  -Trending down, transitioned to oral Augmentin for total 4-week course     Protein-calorie malnutrition, severe Nutrition Problem: Severe Malnutrition Etiology: acute illness (CAP) Signs/Symptoms: moderate fat depletion, severe muscle depletion, energy intake < or equal to 50% for > or equal to 1 month, percent weight loss Interventions: Ensure Enlive (each supplement provides 350kcal and 20 grams of protein), Magic cup, MVI  Estimated body mass index is  22.93 kg/m as calculated from the following:   Height as of this encounter: 6' (1.829 m).   Weight as of this encounter: 76.7 kg.  Code Status: DNR DVT Prophylaxis:  SCDs Start: 02/27/22 0402   Level of Care: Level of care: Med-Surg Family Communication: Updated patient's son-in-law at the bedside yesterday.  No family member at the bedside today at the time of my encounter   Disposition Plan:      Remains inpatient appropriate: Family reported that patient lives with his 11 year old wife who is fragile herself and  not able to assist with ADLs.  With his dementia, sundowning, family is apprehensive of safety of his wife as well.  Family cannot provide 24/7 care/supervision, requesting SNF.  TOC consulted.   DC to SNF once insurance authorization complete, possibly today or tomorrow morning  Procedures:  Chest tube  Consultants:   PCCM  Antimicrobials:   Anti-infectives (From admission, onward)    Start     Dose/Rate Route Frequency Ordered Stop   03/09/22 1545  amoxicillin-clavulanate (AUGMENTIN) 875-125 MG per tablet 1 tablet        1 tablet Oral Every 12 hours 03/09/22 1451     03/09/22 0000  amoxicillin-clavulanate (AUGMENTIN) 875-125 MG tablet        1 tablet Oral 2 times daily 03/09/22 1457 03/29/22 2359   03/04/22 1800  penicillin G potassium 12 Million Units in dextrose 5 % 500 mL continuous infusion  Status:  Discontinued        12 Million Units 41.7 mL/hr over 12 Hours Intravenous Every 12 hours 03/04/22 1258 03/09/22 1451   03/03/22 1800  vancomycin (VANCOREADY) IVPB 1750 mg/350 mL  Status:  Discontinued        1,750 mg 175 mL/hr over 120 Minutes Intravenous Every 48 hours 03/02/22 0823 03/02/22 1111   03/02/22 1800  penicillin G potassium 4 Million Units in dextrose 5 % 250 mL IVPB  Status:  Discontinued        4 Million Units 250 mL/hr over 60 Minutes Intravenous Every 6 hours 03/02/22 1113 03/04/22 1257   02/27/22 1800  piperacillin-tazobactam (ZOSYN) IVPB 3.375 g  Status:  Discontinued        3.375 g 12.5 mL/hr over 240 Minutes Intravenous Every 8 hours 02/27/22 1645 03/02/22 1111   02/27/22 1600  vancomycin (VANCOREADY) IVPB 1250 mg/250 mL  Status:  Discontinued        1,250 mg 166.7 mL/hr over 90 Minutes Intravenous Every 24 hours 02/27/22 1133 03/02/22 0823   02/27/22 1200  cefTRIAXone (ROCEPHIN) 2 g in sodium chloride 0.9 % 100 mL IVPB  Status:  Discontinued        2 g 200 mL/hr over 30 Minutes Intravenous Every 24 hours 02/27/22 1103 02/27/22 1639   02/27/22 1200   azithromycin (ZITHROMAX) 500 mg in sodium chloride 0.9 % 250 mL IVPB  Status:  Discontinued        500 mg 250 mL/hr over 60 Minutes Intravenous Every 24 hours 02/27/22 1103 02/27/22 1639   02/27/22 1115  vancomycin (VANCOREADY) IVPB 1500 mg/300 mL  Status:  Discontinued        1,500 mg 150 mL/hr over 120 Minutes Intravenous  Once 02/27/22 1112 02/27/22 1112   02/26/22 1500  vancomycin (VANCOREADY) IVPB 1500 mg/300 mL        1,500 mg 150 mL/hr over 120 Minutes Intravenous  Once 02/26/22 1457 02/26/22 1850   02/26/22 1445  vancomycin (VANCOCIN) IVPB 1000 mg/200 mL premix  Status:  Discontinued        1,000 mg 200 mL/hr over 60 Minutes Intravenous  Once 02/26/22 1438 02/26/22 1457   02/26/22 1445  ceFEPIme (MAXIPIME) 2 g in sodium chloride 0.9 % 100 mL IVPB        2 g 200 mL/hr over 30 Minutes Intravenous  Once 02/26/22 1438 02/26/22 1745   02/26/22 1400  cefTRIAXone (ROCEPHIN) 1 g in sodium chloride 0.9 % 100 mL IVPB  Status:  Discontinued        1 g 200 mL/hr over 30 Minutes Intravenous  Once 02/26/22 1355 02/26/22 1435   02/26/22 1400  azithromycin (ZITHROMAX) 500 mg in sodium chloride 0.9 % 250 mL IVPB  Status:  Discontinued        500 mg 250 mL/hr over 60 Minutes Intravenous  Once 02/26/22 1355 02/26/22 1435          Medications  amLODipine  10 mg Oral Daily   amoxicillin-clavulanate  1 tablet Oral Q12H   aspirin EC  81 mg Oral Daily   feeding supplement  237 mL Oral TID BM   multivitamin with minerals  1 tablet Oral Daily   mouth rinse  15 mL Mouth Rinse 4 times per day   QUEtiapine  25 mg Oral Q24H   zinc sulfate  220 mg Oral Daily      Subjective:   David [name] was seen and examined today.  Resting in the bed, no acute complaints, alert and oriented x 3.  Mental status at baseline.  Objective:   Vitals:   03/08/22 1204 03/08/22 1620 03/08/22 2020 03/09/22 1325  BP: (!) 148/53 (!) 147/47 (!) 138/50 (!) 137/52  Pulse: (!) 57 60 (!) 56 (!) 56  Resp: '18 18 18 16   '$ Temp: 98.4 F (36.9 C) 98.5 F (36.9 C) 98 F (36.7 C) 98.2 F (36.8 C)  TempSrc: Oral Oral Oral Oral  SpO2: 95% 100% 93% 94%  Weight:      Height:        Intake/Output Summary (Last 24 hours) at 03/09/2022 1506 Last data filed at 03/09/2022 1401 Gross per 24 hour  Intake 240 ml  Output 575 ml  Net -335 ml     Wt Readings from Last 3 Encounters:  02/27/22 76.7 kg  09/09/17 93 kg  11/21/16 88.5 kg    Physical Exam General: Alert and oriented x 3, NAD Cardiovascular: S1 S2 clear, RRR.  Respiratory: CTAB, no wheezing Gastrointestinal: Soft, nontender, nondistended, NBS Ext: no pedal edema bilaterally Neuro: no new deficits Skin: No rashes Psych: Normal affect    Data Reviewed:  I have personally reviewed following labs    CBC Lab Results  Component Value Date   WBC 20.0 (H) 03/09/2022   RBC 3.30 (L) 03/09/2022   HGB 9.1 (L) 03/09/2022   HCT 28.3 (L) 03/09/2022   MCV 85.8 03/09/2022   MCH 27.6 03/09/2022   PLT 348 03/09/2022   MCHC 32.2 03/09/2022   RDW 15.5 03/09/2022   LYMPHSABS 0.3 (L) 02/26/2022   MONOABS 2.7 (H) 02/26/2022   EOSABS 0.0 02/26/2022   BASOSABS 0.1 59/56/3875     Last metabolic panel Lab Results  Component Value Date   NA 136 03/09/2022   K 3.8 03/09/2022   CL 100 03/09/2022   CO2 29 03/09/2022   BUN 11 03/09/2022   CREATININE 0.88 03/09/2022   GLUCOSE 98 03/09/2022   GFRNONAA >60 03/09/2022   GFRAA 54 (L) 09/09/2017   CALCIUM 8.1 (L) 03/09/2022  PROT 6.0 (L) 02/27/2022   ALBUMIN 2.5 (L) 02/27/2022   BILITOT 0.6 02/27/2022   ALKPHOS 83 02/27/2022   AST 40 02/27/2022   ALT 32 02/27/2022   ANIONGAP 7 03/09/2022    CBG (last 3)  No results for input(s): "GLUCAP" in the last 72 hours.    Coagulation Profile: No results for input(s): "INR", "PROTIME" in the last 168 hours.    Radiology Studies: I have personally reviewed the imaging studies  No results found.     Estill Cotta M.D. [facility] 03/09/2022,  3:06 PM  Available via Epic secure chat 7am-7pm After 7 pm, please refer to night coverage provider listed on amion.

## 2022-03-09 NOTE — Care Management Important Message (Signed)
Important Message  Patient Details IM Letter given. Name: David [name] MRN: [id number] Date of Birth: 04-14-1933   Medicare Important Message Given:  Yes     Kerin Salen 03/09/2022, 10:46 AM

## 2022-03-09 NOTE — TOC Progression Note (Addendum)
Transition of Care [facility]) - Progression Note    Patient Details  Name: David [name] MRN: [id number] Date of Birth: 1934-01-31  Transition of Care [facility] CM/SW [place], RN Phone [phone]  03/09/2022, 11:11 AM  Clinical Narrative:    CM received call from [facility] stating that facility can accept patient. Cm called daughter Vaughan Basta [phone] to make her aware. Vaughan Basta states that she is going to tour facility and will call the case manager back in about an hour. Vaughan Basta has also requested that CM fax information to [employer] just in case [facility] through. CM has attempted to reach admissions for [facility] [phone]. Message has been left. Will await return call.   1252 Daughter has called CM to report that family would like to accept bed offer for [facility]. Family is currently at facility and will initiate paperwork. Insurance auth initiated via [employer]. [employer] auth id # [id number]   Expected Discharge Plan: [place] Barriers to Discharge: Continued Medical Work up  Expected Discharge Plan and Services   Discharge Planning Services: CM Consult Post Acute Care Choice: [place] arrangements for the past 2 months: Single Family Home                                       Social Determinants of Health (SDOH) Interventions SDOH Screenings   Food Insecurity: No Food Insecurity (02/27/2022)  Housing: Low Risk  (02/27/2022)  Transportation Needs: No Transportation Needs (02/27/2022)  Utilities: Not At Risk (02/27/2022)  Tobacco Use: Medium Risk (02/26/2022)    Readmission Risk Interventions     No data to display

## 2022-03-10 DIAGNOSIS — J869 Pyothorax without fistula: Secondary | ICD-10-CM | POA: Diagnosis not present

## 2022-03-10 DIAGNOSIS — J9601 Acute respiratory failure with hypoxia: Secondary | ICD-10-CM | POA: Diagnosis not present

## 2022-03-10 DIAGNOSIS — J189 Pneumonia, unspecified organism: Secondary | ICD-10-CM | POA: Diagnosis not present

## 2022-03-10 DIAGNOSIS — J90 Pleural effusion, not elsewhere classified: Secondary | ICD-10-CM | POA: Diagnosis not present

## 2022-03-10 LAB — CREATININE, SERUM
Creatinine, Ser: 0.91 mg/dL (ref 0.61–1.24)
GFR, Estimated: >60 mL/min (ref >60)

## 2022-03-10 LAB — VITAMIN B1: Vitamin B1 (Thiamine): 223.1 nmol/L — ABNORMAL HIGH (ref 66.5–200.0)

## 2022-03-10 LAB — CULTURE, BLOOD (ROUTINE X 2)
Culture: NO GROWTH
Culture: NO GROWTH
Special Requests: ADEQUATE
Special Requests: ADEQUATE

## 2022-03-10 MED ORDER — QUETIAPINE FUMARATE 25 MG PO TABS: 25.0000 mg | ORAL_TABLET | ORAL | 0 refills | Status: DC

## 2022-03-10 MED ORDER — QUETIAPINE FUMARATE 25 MG PO TABS: 25.0000 mg | ORAL_TABLET | Freq: Every day | ORAL | 0 refills | Status: DC

## 2022-03-10 NOTE — Plan of Care (Signed)
  Problem: Clinical Measurements: Goal: Ability to maintain a body temperature in the normal range will improve Outcome: Progressing   Problem: Respiratory: Goal: Ability to maintain adequate ventilation will improve Outcome: Progressing Goal: Ability to maintain a clear airway will improve Outcome: Progressing   Problem: Health Behavior/Discharge Planning: Goal: Ability to manage health-related needs will improve Outcome: Progressing   Problem: Clinical Measurements: Goal: Ability to maintain clinical measurements within normal limits will improve Outcome: Progressing Goal: Will remain free from infection Outcome: Progressing Goal: Diagnostic test results will improve Outcome: Progressing Goal: Respiratory complications will improve Outcome: Progressing Goal: Cardiovascular complication will be avoided Outcome: Progressing   Problem: Activity: Goal: Risk for activity intolerance will decrease Outcome: Progressing   Problem: Nutrition: Goal: Adequate nutrition will be maintained Outcome: Progressing   Problem: Coping: Goal: Level of anxiety will decrease Outcome: Progressing   Problem: Elimination: Goal: Will not experience complications related to bowel motility Outcome: Progressing Goal: Will not experience complications related to urinary retention Outcome: Progressing   Problem: Pain Managment: Goal: General experience of comfort will improve Outcome: Progressing   Problem: Safety: Goal: Ability to remain free from injury will improve Outcome: Progressing   Problem: Skin Integrity: Goal: Risk for impaired skin integrity will decrease Outcome: Progressing

## 2022-03-10 NOTE — Discharge Summary (Signed)
Physician Discharge Summary   Patient: David [name] MRN: [id number] DOB: 1933/12/12  Admit date:     02/26/2022  Discharge date: 03/10/22  Discharge Physician: Estill Cotta, MD    PCP: [name], MD   Recommendations at discharge:   Continue Augmentin 875-'125mg'$  PO BID for 20 days to complete for 4 weeks course, stop date on 03/29/2022 Need appointment with pulmonology, Dr. Verlee Monte in 4 weeks Started on Seroquel 25 mg p.o. at bedtime Follow CBC in 1 week   Discharge Diagnoses:    Community acquired pneumonia of left lower lobe of lung   Acute hypoxic respiratory failure ([place])   Left lung Empyema ([place]) Acute metabolic encephalopathy with sundowning in the setting of dementia Acute on Stage 3a chronic kidney disease (CKD) ([facility])   HTN (hypertension)   Normocytic anemia Hypokalemia Leukocytosis   Protein-calorie malnutrition, severe    Hospital Course:  Patient is a 89 year old male with CKD stage IIIa, HTN, anemia, presented with cough and shortness of breath.  Recent hospitalization for pneumonia at [facility], improved and subsequently developed more shortness of breath on 1/4.  Was found to be hypoxic in ED requiring 4 L O2 I Chest x-ray demonstrated left-sided infiltrate, CTA showed left-sided effusion and total atelectasis left lower lobe. Seen by pulmonology and chest tube placed. Treated with fibrinolytics with improvement.    Procedures 1/4 Chest Tube Insertion. Pt accidentally pulled out. 1/5 Chest Tube Insertion 1/6 Pleural Fibrinolysis Initial day  1/7 Pleural Fibrinolysis Second day  1/8 Pleural Fibrinolysis Third day. Prognosis guarded. Clinically worse. Code status DNR. 1/10: Chest tube removed 1/11: Continues to have agitation with sundowning and delirium 1/13: Delirium resolved, alert and oriented, fairly close to his baseline, awaiting SNF    Assessment and Plan:  Acute hypoxic respiratory failure with community acquired pneumonia of left lower lobe of  lung Left empyema -Initial chest CT showed complete opacification of the left lower lobe, left lower lobe bronchus occlusion, moderate debris's in trachea -PCCM was consulted, status post 3 days of lytics, completed 1/8 -Pleural fluid culture with strep intermedius, patient was placed on IV.  Blood cultures negative. -Repeat chest CT showed marked reduction in effusion, persistent airspace disease left lower lobe, increase in size of loculated fissural fluid in the left mid chest. -Chest tube removed on 1/10 -Transition to oral Augmentin for total 4 weeks course as recommended by pulmonology -O2 sats 90 to percent on room air    Acute metabolic encephalopathy, has underlying dementia with sundowning -Avoid Ativan, had paradoxical effect on 1/10 -CT head negative for any stroke, stable mild atrophy and chronic microvascular ischemic changes -Zyprexa did not help much, started on Seroquel 25 mg nightly -Resolved, currently at baseline alert and oriented x 3   Acute on stage 3a chronic kidney disease (CKD) ([place]) -Baseline creatinine 1.3 in 08/2017 -Worsened to 1.6 on 03/02/2022 -Resolved       HTN (hypertension) -Continue amlodipine       Normocytic anemia -H&H stable, 9.1   Hypokalemia -K3.8   Leukocytosis Admitted with leukocytosis, due to #1, WBCs 44.5K on admission, 20.0 on discharge -Blood cultures 1/11 has remained negative, urine culture negative, pleural fluid culture positive for Streptococcus intermedius  -Trending down, transitioned to oral Augmentin twice daily for total 4-week course, stop date on 03/29/2022       Protein-calorie malnutrition, severe Nutrition Problem: Severe Malnutrition Etiology: acute illness (CAP) Signs/Symptoms: moderate fat depletion, severe muscle depletion, energy intake < or equal to 50% for >  or equal to 1 month, percent weight loss Interventions: Ensure Enlive (each supplement provides 350kcal and 20 grams of protein), Magic cup,  MVI  Updated patient's daughter today and agreeable with plan     Pain control - [place] Controlled Substance Reporting System database was reviewed. and patient was instructed, not to drive, operate heavy machinery, perform activities at heights, swimming or participation in water activities or provide baby-sitting services while on Pain, Sleep and Anxiety Medications; until their outpatient Physician has advised to do so again. Also recommended to not to take more than prescribed Pain, Sleep and Anxiety Medications.  Consultants: Pulmonary critical care Procedures performed: Chest tube Disposition: Skilled nursing facility Diet recommendation:  Discharge Diet Orders (From admission, onward)     Start     Ordered   03/10/22 0000  Diet - low sodium heart healthy        03/10/22 0931           Cardiac diet DISCHARGE MEDICATION: Allergies as of 03/10/2022   No Known Allergies      Medication List     STOP taking these medications    FRUIT & VEGETABLE DAILY PO   guaiFENesin-codeine 100-10 MG/5ML syrup Commonly known as: ROBITUSSIN AC   predniSONE 10 MG (21) Tbpk tablet Commonly known as: STERAPRED UNI-PAK 21 TAB       TAKE these medications    AMINO ACID PO Take 1 capsule by mouth daily.   amLODipine 10 MG tablet Commonly known as: NORVASC Take 10 mg by mouth See admin instructions. Take 10 mg by mouth in the morning only if Hydralazine is not effective   amoxicillin-clavulanate 875-125 MG tablet Commonly known as: AUGMENTIN Take 1 tablet by mouth 2 (two) times daily for 20 days.   aspirin EC 81 MG tablet Take 81 mg by mouth in the morning. Swallow whole.   CO Q10 PO Take 30 mLs by mouth daily.   ECHINACEA-ZINC-VITAMIN C PO Take 1 tablet by mouth daily.   ferrous sulfate 325 (65 FE) MG tablet Take 325 mg by mouth daily with breakfast.   finasteride 1 MG tablet Commonly known as: PROPECIA Take 1 mg by mouth daily.   FISH OIL OMEGA-3 PO Take  1 capsule by mouth daily.   GLUCOSAMINE CHONDROITIN MSM PO Take 30 mLs by mouth daily.   guaiFENesin 100 MG/5ML liquid Commonly known as: ROBITUSSIN Take 5 mLs by mouth every 4 (four) hours as needed for cough or to loosen phlegm.   hydrALAZINE 10 MG tablet Commonly known as: APRESOLINE Take 10 mg by mouth 3 (three) times daily as needed (if Systolic number is 063 or greater).   ipratropium-albuterol 0.5-2.5 (3) MG/3ML Soln Commonly known as: DUONEB Take 3 mLs by nebulization every 6 (six) hours as needed.   MAGNESIUM GLYCINATE PO Take 240 mg by mouth daily.   PROBIOTIC PO Take 1 capsule by mouth daily.   QUEtiapine 25 MG tablet Commonly known as: SEROQUEL Take 1 tablet (25 mg total) by mouth at bedtime.   rosuvastatin 20 MG tablet Commonly known as: CRESTOR Take 20 mg by mouth daily.   SUPER B COMPLEX/C PO Take 1 tablet by mouth daily.   triamcinolone cream 0.1 % Commonly known as: KENALOG Apply 1 Application topically 2 (two) times daily as needed (to legs/ankles- for itching).   Vitamin B-12 2500 MCG Subl Place 2,500 mcg under the tongue daily.   vitamin C 1000 MG tablet Take 1,000 mg by mouth daily.   Vitamin  D3 50 MCG (2000 UT) Tabs Take 2,000 Units by mouth daily.   Zinc 50 MG Tabs Take 50 mg by mouth daily.               Discharge Care Instructions  (From admission, onward)           Start     Ordered   03/10/22 0000  If the dressing is still on your incision site when you go home, remove it on the third day after your surgery date. Remove dressing if it begins to fall off, or if it is dirty or damaged before the third day.        03/10/22 0931            Follow-up Information     [name], MD. Schedule an appointment as soon as possible for a visit in 2 week(s).   Specialty: Family Medicine Why: for hospital follow-up Contact information: [address] [place] [address] [phone]         Maryjane Hurter, MD. Schedule an appointment as soon as possible for a visit in 4 week(s).   Specialty: Pulmonary Disease Why: for hospital follow-up Contact information: [address] [place] [address] [phone]                Discharge Exam: Danley Danker Weights   02/27/22 1400  Weight: 76.7 kg   S: Alert and awake, oriented, sitting upright.  No acute issues overnight.  Delirium has resolved, fairly close to his baseline mental status.  BP (!) 155/53   Pulse (!) 58   Temp 98.2 F (36.8 C) (Oral)   Resp 18   Ht 6' (1.829 m)   Wt 76.7 kg   SpO2 92%   BMI 22.93 kg/m    Physical Exam General: Alert and oriented x 3, NAD Cardiovascular: S1 S2 clear, RRR.  Respiratory: CTAB, no wheezing, rales or rhonchi Gastrointestinal: Soft, nontender, nondistended, NBS Ext: no pedal edema bilaterally Neuro: no new deficits Psych: Normal affect    Condition at discharge: fair  The results of significant diagnostics from this hospitalization (including imaging, microbiology, ancillary and laboratory) are listed below for reference.   Imaging Studies: CT HEAD WO CONTRAST (5MM)  Result Date: 03/05/2022 CLINICAL DATA:  Acute metabolic encephalopathy.  Worsening delirium. EXAM: CT HEAD WITHOUT CONTRAST TECHNIQUE: Contiguous axial images were obtained from the base of the skull through the vertex without intravenous contrast. RADIATION DOSE REDUCTION: This exam was performed according to the departmental dose-optimization program which includes automated exposure control, adjustment of the mA and/or kV according to patient size and/or use of iterative reconstruction technique. COMPARISON:  CT head dated May 27, 2021. FINDINGS: Brain: No evidence of acute infarction, hemorrhage, hydrocephalus, extra-axial collection or mass lesion/mass effect. Stable mild atrophy and chronic microvascular ischemic changes. Vascular: Calcified atherosclerosis at the skull base. No hyperdense vessel. Skull:  Normal. Negative for fracture or focal lesion. Sinuses/Orbits: No acute finding. Chronic left sphenoid sinus disease. Other: None. IMPRESSION: 1. No acute intracranial abnormality. Stable mild atrophy and chronic microvascular ischemic changes. Electronically Signed   By: Titus Dubin M.D.   On: 03/05/2022 10:45   CT CHEST WO CONTRAST  Result Date: 03/03/2022 CLINICAL DATA:  89 year old male presents for evaluation of pneumonia. EXAM: CT CHEST WITHOUT CONTRAST TECHNIQUE: Multidetector CT imaging of the chest was performed following the standard protocol without IV contrast. RADIATION DOSE REDUCTION: This exam was performed according to the departmental dose-optimization  program which includes automated exposure control, adjustment of the mA and/or kV according to patient size and/or use of iterative reconstruction technique. COMPARISON:  February 26, 2022. FINDINGS: Cardiovascular: Calcified aortic atherosclerosis similar to prior imaging. Heart size is stable and moderately enlarged with small but unchanged pericardial effusion. Calcified coronary artery disease, RIGHT and LEFT coronary circulation. Dense aortic valvular calcifications. Central pulmonary vasculature is normal caliber. Limited assessment of cardiovascular structures given lack of intravenous contrast. Mediastinum/Nodes: No thoracic inlet, axillary, mediastinal or hilar adenopathy. Esophagus grossly normal. Lungs/Pleura: Interval reduction in the amount of pleural fluid in the LEFT chest since previous imaging. Very small loculated hydropneumothorax at the LEFT lung base with LEFT-sided chest tube remaining in place along the lateral margin of the costodiaphragmatic sulcus. Loculated pleural fluid in the major fissure measures 4.9 x 3.4 cm, this has increased slightly since previous imaging where it measured approximately 3.7 x 1.8 cm. Trace pleural fluid remains evident at the periphery of the dependent chest and tracking anteriorly along the  inferior chest just above the LEFT hemidiaphragm, likely in communication with the small medial and basilar hydropneumothorax. Airspace disease in the LEFT chest has improved following reduction in pleural fluid. Airways are patent. Trace RIGHT-sided pleural effusion has developed since previous imaging. Small RIGHT upper lobe pulmonary nodule (image 69/5) 6 mm size. Upper Abdomen: No acute upper abdominal findings. Though there is trace ascites anterior to the liver. Musculoskeletal: No acute or destructive bone findings. Spinal degenerative changes. IMPRESSION: 1. Marked interval reduction in the large LEFT sub pulmonic effusion seen on previous imaging and improved aeration at the LEFT lung base. Still with very small LEFT-sided hydropneumothorax with chest tube remaining in place. Pneumothorax is tiny in tracks just over the RIGHT hemidiaphragm following evacuation of loculated effusion. 2. Persistent airspace disease in the LEFT lower lobe and lingula. Suggest follow-up to ensure resolution. 3. Increasing size of loculated fissural fluid in the LEFT mid chest since previous imaging. Attention on follow-up. 4. Trace RIGHT-sided pleural effusion as developed in the interval. 5. RIGHT upper lobe pulmonary nodule measuring 6 mm. Non-contrast chest CT at 6-12 months is recommended. If the nodule is stable at time of repeat CT, then future CT at 18-24 months (from today's scan) is considered optional for low-risk patients, but is recommended for high-risk patients. This recommendation follows the consensus statement: Guidelines for Management of Incidental Pulmonary Nodules Detected on CT Images: From the Fleischner Society 2017; Radiology 2017; 284:228-243. Aortic Atherosclerosis (ICD10-I70.0). Electronically Signed   By: Zetta Bills M.D.   On: 03/03/2022 16:06   DG Chest Port 1 View  Result Date: 03/03/2022 CLINICAL DATA:  [id number] [facility]) [id number] EXAM: PORTABLE CHEST 1 VIEW COMPARISON:  Chest radiograph  from one day prior. FINDINGS: Left costophrenic angle pigtail chest tube in place. Stable cardiomediastinal silhouette with mild cardiomegaly. No pneumothorax. Stable small left lateral basilar pleural effusion/thickening. No right pleural effusion. No overt pulmonary edema. Stable patchy left lung base opacity. IMPRESSION: 1. No pneumothorax. Left costophrenic angle pigtail chest tube in place. 2. Stable small left lateral basilar pleural effusion/thickening. 3. Stable patchy left lung base opacity. Electronically Signed   By: Ilona Sorrel M.D.   On: 03/03/2022 08:01   DG CHEST PORT 1 VIEW  Result Date: 03/02/2022 CLINICAL DATA:  Pleural effusion EXAM: PORTABLE CHEST 1 VIEW COMPARISON:  03/01/2022 FINDINGS: Left basilar chest tube. No pneumothorax. Left lower lobe airspace disease may reflect atelectasis versus pneumonia. Trace left pleural effusion. Mild right basilar atelectasis. No  pneumothorax. Heart and mediastinal contours are unremarkable. No acute osseous abnormality. IMPRESSION: 1. Left basilar chest tube. No pneumothorax. 2. Left lower lobe airspace disease may reflect atelectasis versus pneumonia. Electronically Signed   By: Kathreen Devoid M.D.   On: 03/02/2022 08:27   DG CHEST PORT 1 VIEW  Result Date: 03/01/2022 CLINICAL DATA:  Follow up pleural effusion. EXAM: PORTABLE CHEST 1 VIEW COMPARISON:  Radiographs 02/28/2022 and 02/27/2022.  CT 02/26/2022. FINDINGS: 0814 hours. Small caliber pigtail catheter inferolaterally in the left pleural space is unchanged in position. The left pleural effusion and the left lung aeration have mildly improved. There is no pneumothorax. The right lung is clear. The heart size and mediastinal contours are stable with aortic atherosclerosis. IMPRESSION: Mildly improved left pleural effusion and left lung aeration following chest tube placement. No pneumothorax. Electronically Signed   By: Richardean Sale M.D.   On: 03/01/2022 12:32   DG CHEST PORT 1 VIEW  Result  Date: 02/28/2022 CLINICAL DATA:  Acute respiratory failure.  Hypoxia. EXAM: PORTABLE CHEST 1 VIEW COMPARISON:  02/27/2022 FINDINGS: Stable cardiomediastinal contours. Aortic atherosclerosis. Stable position of left basilar pigtail thoracostomy tube. No pneumothorax identified. Moderate left pleural effusion is again noted with left lower lobe atelectasis. Veil like opacification over the left upper lobe is similar to previous exam. Right lung appears clear. IMPRESSION: 1. Unchanged position of left pigtail thoracostomy tube without pneumothorax. 2. Persistent moderate left pleural effusion with left lower lobe atelectasis. Electronically Signed   By: Kerby Moors M.D.   On: 02/28/2022 10:01   DG Chest 1 View  Result Date: 02/27/2022 CLINICAL DATA:  Acute pain, chest tube placement. EXAM: CHEST  1 VIEW COMPARISON:  Chest x-ray 02/26/2022.  Chest CT 02/26/2022. FINDINGS: New left-sided chest tube is in place. Moderate-sized left pleural effusion and left basilar opacities appear unchanged. Right lung is clear. No mediastinal shift. Cardiomediastinal silhouette is stable. No acute fracture. IMPRESSION: New left-sided chest tube is in place. Moderate-sized left pleural effusion and left basilar opacities appear unchanged. Electronically Signed   By: Ronney Asters M.D.   On: 02/27/2022 16:24   DG Chest Port 1 View  Result Date: 02/27/2022 CLINICAL DATA:  Respiratory complication. EXAM: PORTABLE CHEST 1 VIEW COMPARISON:  February 26, 2022 (6:52 p.m.) FINDINGS: The cardiac silhouette is mildly enlarged and unchanged in size. There is moderate severity calcification of the aortic arch. Opacification of the mid left lung and left lung base is again seen. This is stable in appearance when compared to the prior study. A stable moderate size left-sided pleural effusion is noted. No pneumothorax is identified. The visualized skeletal structures are unremarkable. IMPRESSION: 1. Stable moderate size left-sided pleural  effusion with stable moderate to marked severity airspace disease within the mid left lung and left lung base. Electronically Signed   By: Virgina Norfolk M.D.   On: 02/27/2022 00:22   DG CHEST PORT 1 VIEW  Result Date: 02/26/2022 CLINICAL DATA:  Status post chest tube insertion EXAM: PORTABLE CHEST 1 VIEW COMPARISON:  Chest x-ray dated 4th 2024 FINDINGS: Visualized cardiac and mediastinal contours are unchanged. Moderate left pleural effusion and atelectasis, similar to prior exam. Interval placement of left-sided chest tube. No evidence of pneumothorax. IMPRESSION: Interval placement of left-sided chest tube. No evidence of pneumothorax. Electronically Signed   By: Yetta Glassman M.D.   On: 02/26/2022 19:38   CT Angio Chest PE W and/or Wo Contrast  Result Date: 02/26/2022 CLINICAL DATA:  Pulmonary embolism (PE) suspected, high prob Shortness  of breath.  Recent pneumonia diagnosis. EXAM: CT ANGIOGRAPHY CHEST WITH CONTRAST TECHNIQUE: Multidetector CT imaging of the chest was performed using the standard protocol during bolus administration of intravenous contrast. Multiplanar CT image reconstructions and MIPs were obtained to evaluate the vascular anatomy. RADIATION DOSE REDUCTION: This exam was performed according to the departmental dose-optimization program which includes automated exposure control, adjustment of the mA and/or kV according to patient size and/or use of iterative reconstruction technique. CONTRAST:  142m OMNIPAQUE IOHEXOL 350 MG/ML SOLN COMPARISON:  Radiograph earlier today. Radiograph 02/09/2022. No prior chest CT available. FINDINGS: Cardiovascular: There are no filling defects within the pulmonary arteries to suggest pulmonary embolus. Atherosclerosis of the thoracic aorta. No dissection or acute aortic findings. The heart is upper normal in size. There are coronary artery, aortic valvular and mitral annulus calcifications. Dilated main pulmonary artery measuring 3.5 cm. Lobulated  fluid adjacent to the anterior superior mediastinum is felt to represent loculated pleural fluid. Trace pericardial effusion. Mediastinum/Nodes: 11 mm anterior right paratracheal node series 5, image 51. Left hilar assessment is limited due to adjacent airspace disease. No right hilar adenopathy. The esophagus is decompressed. Lungs/Pleura: Breathing motion artifact in the lung bases. Patient is partially loss, tract inguinal fissure as well as anterior and post early. No convincing pleural thickening. Complete collapse/atelectasis of the left lower lobe, with a heterogeneous areas of low-density within the collapsed lung. Presumed atelectasis within the lingula adjacent to pleural fluid. There is moderate debris in the left aspect of the trachea tracking into the left greater than right mainstem bronchi. The left lower lobe bronchus appears to be occluded. No discrete pulmonary nodules in the aerated lungs. No features of pulmonary edema. There is no right pleural effusion. Upper Abdomen: Abdominal aortic atherosclerosis. No acute findings in the included upper abdomen. Musculoskeletal: Exaggerated thoracic kyphosis with diffuse spondylosis. There are no acute or suspicious osseous abnormalities. No chest wall soft tissue abnormalities. Review of the MIP images confirms the above findings. IMPRESSION: 1. No pulmonary embolus. 2. Moderate left-sided pleural effusion is partially loculated. 3. Complete opacification of the left lower lobe, with heterogeneous areas of low-density within the collapsed lung. The left lower lobe bronchus appears to be occluded. There is moderate debris in the left aspect of the trachea tracking into the left greater than right mainstem bronchi. Differential considerations include pneumonia (including aspiration pneumonia) as well as neoplasm. 4. Mildly prominent anterior right paratracheal node, likely reactive. 5. Coronary artery, aortic valvular, and mitral annulus calcifications. 6.  Dilated main pulmonary artery suggesting pulmonary arterial hypertension. Aortic Atherosclerosis (ICD10-I70.0). Electronically Signed   By: MKeith RakeM.D.   On: 02/26/2022 16:04   DG Chest Port 1 View  Result Date: 02/26/2022 CLINICAL DATA:  Sepsis.  Pneumonia 3 weeks ago EXAM: PORTABLE CHEST 1 VIEW COMPARISON:  Chest x-ray 02/09/2022 and older FINDINGS: Developing moderate left pleural effusion with increasing adjacent opacity compared to the prior x-ray. No pneumothorax or edema. Enlarged cardiopericardial silhouette with calcified aorta. Films are under penetrated. Overlapping cardiac leads. Film is rotated to the left. IMPRESSION: Developing moderate left effusion with adjacent increasing opacity. Recommend follow-up. Electronically Signed   By: AJill SideM.D.   On: 02/26/2022 14:06    Microbiology: Results for orders placed or performed during the hospital encounter of 02/26/22  Resp panel by RT-PCR (RSV, Flu A&B, Covid) Anterior Nasal Swab     Status: None   Collection Time: 02/26/22  1:07 PM   Specimen: Anterior Nasal Swab  Result Value  Ref Range Status   SARS Coronavirus 2 by RT PCR NEGATIVE NEGATIVE Final    Comment: (NOTE) SARS-CoV-2 target nucleic acids are NOT DETECTED.  The SARS-CoV-2 RNA is generally detectable in upper respiratory specimens during the acute phase of infection. The lowest concentration of SARS-CoV-2 viral copies this assay can detect is 138 copies/mL. A negative result does not preclude SARS-Cov-2 infection and should not be used as the sole basis for treatment or other patient management decisions. A negative result may occur with  improper specimen collection/handling, submission of specimen other than nasopharyngeal swab, presence of viral mutation(s) within the areas targeted by this assay, and inadequate number of viral copies(<138 copies/mL). A negative result must be combined with clinical observations, patient history, and  epidemiological information. The expected result is Negative.  Fact Sheet for Patients:  EntrepreneurPulse.com.au  Fact Sheet for Healthcare Providers:  IncredibleEmployment.be  This test is no t yet approved or cleared by the [place] FDA and  has been authorized for detection and/or diagnosis of SARS-CoV-2 by FDA under an Emergency Use Authorization (EUA). This EUA will remain  in effect (meaning this test can be used) for the duration of the COVID-19 declaration under Section 564(b)(1) of the Act, 21 U.S.C.section 360bbb-3(b)(1), unless the authorization is terminated  or revoked sooner.       Influenza A by PCR NEGATIVE NEGATIVE Final   Influenza B by PCR NEGATIVE NEGATIVE Final    Comment: (NOTE) The Xpert Xpress SARS-CoV-2/FLU/RSV plus assay is intended as an aid in the diagnosis of influenza from Nasopharyngeal swab specimens and should not be used as a sole basis for treatment. Nasal washings and aspirates are unacceptable for Xpert Xpress SARS-CoV-2/FLU/RSV testing.  Fact Sheet for Patients: EntrepreneurPulse.com.au  Fact Sheet for Healthcare Providers: IncredibleEmployment.be  This test is not yet approved or cleared by the [place] FDA and has been authorized for detection and/or diagnosis of SARS-CoV-2 by FDA under an Emergency Use Authorization (EUA). This EUA will remain in effect (meaning this test can be used) for the duration of the COVID-19 declaration under Section 564(b)(1) of the Act, 21 U.S.C. section 360bbb-3(b)(1), unless the authorization is terminated or revoked.     Resp Syncytial Virus by PCR NEGATIVE NEGATIVE Final    Comment: (NOTE) Fact Sheet for Patients: EntrepreneurPulse.com.au  Fact Sheet for Healthcare Providers: IncredibleEmployment.be  This test is not yet approved or cleared by the [place] FDA and has been  authorized for detection and/or diagnosis of SARS-CoV-2 by FDA under an Emergency Use Authorization (EUA). This EUA will remain in effect (meaning this test can be used) for the duration of the COVID-19 declaration under Section 564(b)(1) of the Act, 21 U.S.C. section 360bbb-3(b)(1), unless the authorization is terminated or revoked.  Performed at [facility], [place] [address], [place], [place] [address]   Blood Culture (routine x 2)     Status: None   Collection Time: 02/26/22  1:07 PM   Specimen: BLOOD  Result Value Ref Range Status   Specimen Description   Final    BLOOD LEFT ANTECUBITAL Performed at [place] [address], [place], [place] [address]    Special Requests   Final    BOTTLES DRAWN AEROBIC AND ANAEROBIC Blood Culture adequate volume Performed at [place] [address], [place], [place] [address]    Culture   Final    NO GROWTH 5 DAYS Performed at [facility], [place],  [place] [address]    Report Status 03/03/2022 FINAL  Final  Blood Culture (routine x 2)     Status: None   Collection Time: 02/26/22  1:12 PM   Specimen: BLOOD LEFT FOREARM  Result Value Ref Range Status   Specimen Description   Final    BLOOD LEFT FOREARM Performed at [facility], [place] [address] [place], [place] [address]    Special Requests   Final    BOTTLES DRAWN AEROBIC AND ANAEROBIC Blood Culture adequate volume Performed at [place] [address], [place], [place] [address]    Culture   Final    NO GROWTH 5 DAYS Performed at [facility], [place] [address] [place], [place] [address]    Report Status 03/03/2022 FINAL  Final  Urine Culture     Status: None   Collection Time: 02/26/22  7:30 PM   Specimen: In/Out Cath Urine  Result Value Ref Range Status   Specimen Description   Final    IN/OUT CATH URINE Performed at [place]  [address], [place], [place] [address]    Special Requests   Final    NONE Performed at [facility], [place] [address] [place], [place] [address]    Culture   Final    NO GROWTH Performed at [facility], [place] [address] [place], [place] [address]    Report Status 02/27/2022 FINAL  Final  MRSA Next Gen by PCR, Nasal     Status: None   Collection Time: 02/26/22  9:22 PM   Specimen: Pleural; Nasal Swab  Result Value Ref Range Status   MRSA by PCR Next Gen NOT DETECTED NOT DETECTED Final    Comment: (NOTE) The GeneXpert MRSA Assay (FDA approved for NASAL specimens only), is one component of a comprehensive MRSA colonization surveillance program. It is not intended to diagnose MRSA infection nor to guide or monitor treatment for MRSA infections. Test performance is not FDA approved in patients less than 61 years old. Performed at [facility], [place] [address], [place], [place] [address]   Body fluid culture w Gram Stain     Status: None   Collection Time: 02/26/22  9:22 PM   Specimen: Pleura; Body Fluid  Result Value Ref Range Status   Specimen Description   Final    PLEURAL Performed at [place] [address], [place], [place] [address]    Special Requests   Final    NONE Performed at [facility], [place] [address], [place], [place] [address]    Gram Stain   Final    RARE WBC PRESENT, PREDOMINANTLY MONONUCLEAR NO ORGANISMS SEEN    Culture   Final    RARE STREPTOCOCCUS INTERMEDIUS CRITICAL RESULT CALLED TO, READ BACK BY AND VERIFIED WITH: RN ESTHER F.  [phone] FCP Performed at [facility], [place] [address] [place], [place] [address]    Report Status 03/02/2022 FINAL  Final   Organism ID, Bacteria STREPTOCOCCUS INTERMEDIUS  Final      Susceptibility   Streptococcus intermedius - MIC*    PENICILLIN <=0.06 SENSITIVE Sensitive     CEFTRIAXONE <=0.12 SENSITIVE Sensitive      ERYTHROMYCIN >=8 RESISTANT Resistant     LEVOFLOXACIN 0.5 SENSITIVE Sensitive     VANCOMYCIN 0.5 SENSITIVE Sensitive     * RARE STREPTOCOCCUS INTERMEDIUS  Body fluid culture w Gram Stain  Status: None   Collection Time: 02/27/22  4:04 PM   Specimen: Pleura; Body Fluid  Result Value Ref Range Status   Specimen Description   Final    PLEURAL LEFT Performed at [place] [address], [place], [place] [address]    Special Requests   Final    NONE Performed at [facility], [place] [address] [place], [place] [address]    Gram Stain   Final    ABUNDANT WBC PRESENT, PREDOMINANTLY PMN NO ORGANISMS SEEN    Culture   Final    FEW STREPTOCOCCUS INTERMEDIUS CRITICAL VALUE NOTED.  VALUE IS CONSISTENT WITH PREVIOUSLY REPORTED AND CALLED VALUE. SUSCEPTIBILITIES PERFORMED ON PREVIOUS CULTURE WITHIN THE LAST 5 DAYS. Performed at [facility], [place] [address] [place], [place] [address]    Report Status 03/02/2022 FINAL  Final  Culture, blood (Routine X 2) w Reflex to ID Panel     Status: None (Preliminary result)   Collection Time: 03/05/22 12:14 PM   Specimen: BLOOD  Result Value Ref Range Status   Specimen Description   Final    BLOOD BLOOD RIGHT HAND Performed at [place] [address] [place], [place] [address]    Special Requests   Final    IN PEDIATRIC BOTTLE Blood Culture adequate volume Performed at [place] [address], [place], [place] [address]    Culture   Final    NO GROWTH 4 DAYS Performed at [facility], [place] [address], [place], [place] [address]    Report Status PENDING  Incomplete  Culture, blood (Routine X 2) w Reflex to ID Panel     Status: None (Preliminary result)   Collection Time: 03/05/22 12:14 PM   Specimen: BLOOD LEFT ARM  Result Value Ref Range Status   Specimen Description   Final    BLOOD LEFT ARM Performed at [place] [address], [place], [place] [address]    Special Requests   Final    BOTTLES DRAWN AEROBIC ONLY Blood Culture adequate volume Performed at [place] [address], [place], [place] [address]    Culture   Final    NO GROWTH 4 DAYS Performed at [facility], [place] [address] [place], [place] [address]    Report Status PENDING  Incomplete    Labs: CBC: Recent Labs  Lab 03/04/22 0705 03/05/22 1025 03/06/22 0449 03/07/22 0658 03/09/22 0406  WBC 19.7* 22.1* 21.5* 22.5* 20.0*  HGB 10.4* 10.6* 9.4* 9.3* 9.1*  HCT 32.7* 33.0* 29.5* 29.2* 28.3*  MCV 84.7 84.6 84.8 86.1 85.8  PLT 518* 469* 394 360 742   Basic Metabolic Panel: Recent Labs  Lab 03/04/22 0741 03/05/22 0558 03/05/22 1025 03/06/22 0449 03/07/22 0658 03/08/22 0537 03/09/22 0406 03/10/22 0354  NA 137  --  137 136 134*  --  136  --   K 3.4*  --  3.3* 3.6 3.4*  --  3.8  --   CL 102  --  102 103 99  --  100  --   CO2 27  --  '29 28 27  '$ --  29  --   GLUCOSE 95  --  100* 105* 100*  --  98  --   BUN 21  --  '17 16 12  '$ --  11  --   CREATININE 0.96   < > 0.97 1.00 0.73 0.92 0.88 0.91  CALCIUM  8.8*  --  8.5* 8.2* 8.2*  --  8.1*  --    < > = values in this interval not displayed.   Liver Function Tests: No results for input(s): "AST", "ALT", "ALKPHOS", "BILITOT", "PROT", "ALBUMIN" in the last 168 hours. CBG: No results for input(s): "GLUCAP" in the last 168 hours.  Discharge time spent: greater than 30 minutes.  Signed: Estill Cotta, MD [facility] 03/10/2022

## 2022-03-10 NOTE — Plan of Care (Signed)
Problem: Clinical Measurements: Goal: Ability to maintain a body temperature in the normal range will improve 03/10/2022 1129 by Linus Salmons, RN Outcome: Adequate for Discharge 03/10/2022 1110 by Linus Salmons, RN Outcome: Progressing   Problem: Respiratory: Goal: Ability to maintain adequate ventilation will improve 03/10/2022 1129 by Linus Salmons, RN Outcome: Adequate for Discharge 03/10/2022 1110 by Linus Salmons, RN Outcome: Progressing Goal: Ability to maintain a clear airway will improve 03/10/2022 1129 by Linus Salmons, RN Outcome: Adequate for Discharge 03/10/2022 1110 by Linus Salmons, RN Outcome: Progressing   Problem: Health Behavior/Discharge Planning: Goal: Ability to manage health-related needs will improve 03/10/2022 1129 by Linus Salmons, RN Outcome: Adequate for Discharge 03/10/2022 1110 by Linus Salmons, RN Outcome: Progressing   Problem: Clinical Measurements: Goal: Ability to maintain clinical measurements within normal limits will improve 03/10/2022 1129 by Linus Salmons, RN Outcome: Adequate for Discharge 03/10/2022 1110 by Linus Salmons, RN Outcome: Progressing Goal: Will remain free from infection 03/10/2022 1129 by Linus Salmons, RN Outcome: Adequate for Discharge 03/10/2022 1110 by Linus Salmons, RN Outcome: Progressing Goal: Diagnostic test results will improve 03/10/2022 1129 by Linus Salmons, RN Outcome: Adequate for Discharge 03/10/2022 1110 by Linus Salmons, RN Outcome: Progressing Goal: Respiratory complications will improve 03/10/2022 1129 by Linus Salmons, RN Outcome: Adequate for Discharge 03/10/2022 1110 by Linus Salmons, RN Outcome: Progressing Goal: Cardiovascular complication will be avoided 03/10/2022 1129 by Linus Salmons, RN Outcome: Adequate for Discharge 03/10/2022 1110 by Linus Salmons, RN Outcome: Progressing   Problem: Activity: Goal: Risk for activity intolerance will decrease 03/10/2022 1129 by Linus Salmons,  RN Outcome: Adequate for Discharge 03/10/2022 1110 by Linus Salmons, RN Outcome: Progressing   Problem: Nutrition: Goal: Adequate nutrition will be maintained 03/10/2022 1129 by Linus Salmons, RN Outcome: Adequate for Discharge 03/10/2022 1110 by Linus Salmons, RN Outcome: Progressing   Problem: Coping: Goal: Level of anxiety will decrease 03/10/2022 1129 by Linus Salmons, RN Outcome: Adequate for Discharge 03/10/2022 1110 by Linus Salmons, RN Outcome: Progressing   Problem: Elimination: Goal: Will not experience complications related to bowel motility 03/10/2022 1129 by Linus Salmons, RN Outcome: Adequate for Discharge 03/10/2022 1110 by Linus Salmons, RN Outcome: Progressing Goal: Will not experience complications related to urinary retention 03/10/2022 1129 by Linus Salmons, RN Outcome: Adequate for Discharge 03/10/2022 1110 by Linus Salmons, RN Outcome: Progressing   Problem: Pain Managment: Goal: General experience of comfort will improve 03/10/2022 1129 by Linus Salmons, RN Outcome: Adequate for Discharge 03/10/2022 1110 by Linus Salmons, RN Outcome: Progressing   Problem: Safety: Goal: Ability to remain free from injury will improve 03/10/2022 1129 by Linus Salmons, RN Outcome: Adequate for Discharge 03/10/2022 1110 by Linus Salmons, RN Outcome: Progressing   Problem: Skin Integrity: Goal: Risk for impaired skin integrity will decrease 03/10/2022 1129 by Linus Salmons, RN Outcome: Adequate for Discharge 03/10/2022 1110 by Linus Salmons, RN Outcome: Progressing   Problem: Malnutrition  (NI-5.2) Goal: Food and/or nutrient delivery Description: Individualized approach for food/nutrient provision. Outcome: Adequate for Discharge   Problem: Acute Rehab PT Goals(only PT should resolve) Goal: Pt Will Go Supine/Side To Sit Outcome: Adequate for Discharge Goal: Pt Will Go Sit To Supine/Side Outcome: Adequate for Discharge Goal: Patient Will Transfer Sit  To/From Stand Outcome: Adequate for Discharge Goal: Pt Will Ambulate Outcome: Adequate for Discharge   Problem: Acute Rehab  OT Goals (only OT should resolve) Goal: Pt. Will Perform Grooming Outcome: Adequate for Discharge Goal: Pt. Will Perform Upper Body Bathing Outcome: Adequate for Discharge Goal: Pt. Will Transfer To Toilet Outcome: Adequate for Discharge

## 2022-03-10 NOTE — TOC Transition Note (Addendum)
Transition of Care [facility]) - CM/SW Discharge Note   Patient Details  Name: David [name] MRN: [id number] Date of Birth: 1934-02-11  Transition of Care [facility] CM/SW Contact:  Angelita Ingles, RN Phone Number:[phone]  03/10/2022, 10:18 AM   Clinical Narrative:    Insurance Josem Kaufmann has been approved Plan auth ID [id number] SNF 03/09/22-03/11/22 [place] ID [id number]. CM has updated MD and RN. Daughter Vaughan Basta has been updated. Plan is for family to transport to facility. Patient can discharge to facility after CM faxed discharge summary to SNF.   Discharge summary has been faxed to [facility] via the hub. Message has been left for [facility] admissions director to make her aware. No discharge packet prepared due to family transporting.  Please call report to  [place] Room #201    Final next level of care: [place] Barriers to Discharge: Continued Medical Work up   Patient Goals and CMS Choice CMS Medicare.gov Compare Post Acute Care list provided to:: Patient Represenative (must comment) (Linda(dtr)) Choice offered to / list presented to : Spouse, Adult Children  Discharge Placement                         Discharge Plan and Services Additional resources added to the After Visit Summary for     Discharge Planning Services: CM Consult Post Acute Care Choice: Home Health                               Social Determinants of Health (SDOH) Interventions SDOH Screenings   Food Insecurity: No Food Insecurity (02/27/2022)  Housing: Low Risk  (02/27/2022)  Transportation Needs: No Transportation Needs (02/27/2022)  Utilities: Not At Risk (02/27/2022)  Tobacco Use: Medium Risk (02/26/2022)     Readmission Risk Interventions     No data to display

## 2022-03-10 NOTE — Progress Notes (Signed)
Report called to Manuela Schwartz at [facility].

## 2022-04-03 ENCOUNTER — Other Ambulatory Visit: Payer: Self-pay | Admitting: Student

## 2022-04-03 DIAGNOSIS — J90 Pleural effusion, not elsewhere classified: Secondary | ICD-10-CM

## 2022-04-04 NOTE — Progress Notes (Unsigned)
Synopsis: Referred for empyema by [name], MD  Subjective:   PATIENT ID: David [name] GENDER: male DOB: [birth year], MRN: [id number]  Chief Complaint  Patient presents with   Hospitalization Follow-up    Breathing is about the same since hospital d/c.   88yM with history of HTN, CKD3, PH, arthritis seen recently for CAP and strep empyema to finish course of augmentin for 4 weeks.   He says he is doing ok now. Does have some swelling in his ankles. Lives at home now. Did SNF for 2 weeks. Did just start seeing PT with home health. Does have some BLE swelling.   He has no cough. No fever.   Otherwise pertinent review of systems is negative.  He has no family history of lung cancer  He smoked for 20 years, 2ppd, quit 1970. He worked as a Occupational hygienist.   Past Medical History:  Diagnosis Date   Hypertension      Family History  Problem Relation Age of Onset   Cancer Mother        breast   Heart disease Father        heart attack at age 32     Past Surgical History:  Procedure Laterality Date   COLON SURGERY     KNEE SURGERY  2009 or 2010   replaced knee cap   PROSTATE SURGERY  2012    Social History   Socioeconomic History   Marital status: Married    Spouse name: Hoyle Sauer   Number of children: 3   Years of education: 12   Highest education level: Not on file  Occupational History    Comment: retired  Tobacco Use   Smoking status: Former    Packs/day: 2.00    Years: 20.00    Total pack years: 40.00    Types: Cigarettes    Quit date: 02/24/1968    Years since quitting: 54.1   Smokeless tobacco: Never  Vaping Use   Vaping Use: Never used  Substance and Sexual Activity   Alcohol use: No   Drug use: No   Sexual activity: Not on file  Other Topics Concern   Not on file  Social History Narrative   Lives at home with spouse   Caffeine use- occass coffee, tea   Social Determinants of Health   Financial Resource Strain: Not on file   Food Insecurity: No Food Insecurity (02/27/2022)   Hunger Vital Sign    Worried About Running Out of Food in the Last Year: Never true    [place] in the Last Year: Never true  Transportation Needs: No Transportation Needs (02/27/2022)   PRAPARE - Hydrologist (Medical): No    Lack of Transportation (Non-Medical): No  Physical Activity: Not on file  Stress: Not on file  Social Connections: Not on file  Intimate Partner Violence: Not At Risk (02/27/2022)   Humiliation, Afraid, Rape, and Kick questionnaire    Fear of Current or Ex-Partner: No    Emotionally Abused: No    Physically Abused: No    Sexually Abused: No     No Known Allergies   Outpatient Medications Prior to Visit  Medication Sig Dispense Refill   Amino Acids (AMINO ACID PO) Take 1 capsule by mouth daily.     amLODipine (NORVASC) 10 MG tablet Take 10 mg by mouth See admin instructions. Take 10 mg by mouth in the morning only if Hydralazine is not effective  Ascorbic Acid (VITAMIN C) 1000 MG tablet Take 1,000 mg by mouth daily.     aspirin EC 81 MG tablet Take 81 mg by mouth in the morning. Swallow whole.     Cholecalciferol (VITAMIN D3) 50 MCG (2000 UT) TABS Take 2,000 Units by mouth daily.     Coenzyme Q10 (CO Q10 PO) Take 30 mLs by mouth daily.     Cyanocobalamin (VITAMIN B-12) 2500 MCG SUBL Place 2,500 mcg under the tongue daily.     ECHINACEA-ZINC-VITAMIN C PO Take 1 tablet by mouth daily.     ferrous sulfate 325 (65 FE) MG tablet Take 325 mg by mouth daily with breakfast.     finasteride (PROPECIA) 1 MG tablet Take 1 mg by mouth daily.     GLUCOSAMINE CHONDROITIN MSM PO Take 30 mLs by mouth daily.     guaiFENesin (ROBITUSSIN) 100 MG/5ML liquid Take 5 mLs by mouth every 4 (four) hours as needed for cough or to loosen phlegm. 120 mL 0   hydrALAZINE (APRESOLINE) 10 MG tablet Take 10 mg by mouth 3 (three) times daily as needed (if Systolic number is [id number] or greater).     MAGNESIUM  GLYCINATE PO Take 240 mg by mouth daily.     Omega-3 Fatty Acids (FISH OIL OMEGA-3 PO) Take 1 capsule by mouth daily.     Probiotic Product (PROBIOTIC PO) Take 1 capsule by mouth daily.     rosuvastatin (CRESTOR) 20 MG tablet Take 20 mg by mouth daily.     SUPER B COMPLEX/C PO Take 1 tablet by mouth daily.     triamcinolone cream (KENALOG) 0.1 % Apply 1 Application topically 2 (two) times daily as needed (to legs/ankles- for itching).     Zinc 50 MG TABS Take 50 mg by mouth daily.     ipratropium-albuterol (DUONEB) 0.5-2.5 (3) MG/3ML SOLN Take 3 mLs by nebulization every 6 (six) hours as needed. (Patient not taking: Reported on 04/06/2022) 360 mL    QUEtiapine (SEROQUEL) 25 MG tablet Take 1 tablet (25 mg total) by mouth at bedtime. 20 tablet 0   No facility-administered medications prior to visit.       Objective:   Physical Exam:  General appearance: 89 y.o., male, NAD, conversant  Eyes: anicteric sclerae; PERRL, tracking appropriately HENT: NCAT; MMM Neck: Trachea midline; no lymphadenopathy, no JVD Lungs: CTAB, no crackles, no wheeze, with normal respiratory effort CV: RRR, no murmur  Abdomen: Soft, non-tender; non-distended, BS present  Extremities: No peripheral edema, warm Skin: Normal turgor and texture; no rash Psych: Appropriate affect Neuro: Alert and oriented to person and place, no focal deficit     Vitals:   04/06/22 1342  BP: (!) 136/58  Pulse: (!) 57  Temp: 98 F (36.7 C)  TempSrc: Oral  SpO2: 97%  Weight: 173 lb 12.8 oz (78.8 kg)  Height: 6' (1.829 m)   97% on RA BMI Readings from Last 3 Encounters:  04/06/22 23.57 kg/m  02/27/22 22.93 kg/m  09/09/17 27.80 kg/m   Wt Readings from Last 3 Encounters:  04/06/22 173 lb 12.8 oz (78.8 kg)  02/27/22 169 lb 1.5 oz (76.7 kg)  09/09/17 205 lb (93 kg)     CBC    Component Value Date/Time   WBC 20.0 (H) 03/09/2022 0406   RBC 3.30 (L) 03/09/2022 0406   HGB 9.1 (L) 03/09/2022 0406   HCT 28.3 (L)  03/09/2022 0406   PLT 348 03/09/2022 0406   MCV 85.8 03/09/2022 0406   MCH  27.6 03/09/2022 0406   MCHC 32.2 03/09/2022 0406   RDW 15.5 03/09/2022 0406   LYMPHSABS 0.3 (L) 02/26/2022 1307   MONOABS 2.7 (H) 02/26/2022 1307   EOSABS 0.0 02/26/2022 1307   BASOSABS 0.1 02/26/2022 1307    Pleural fluid cyto negative 02/26/22  Chest Imaging: CT Chest 03/03/22 with 69m RUL nodule, marked interval reduction in size of L empyema, some ptx component remains  Pulmonary Functions Testing Results:     No data to display           Echocardiogram:   Left ventricular systolic function is normal.  LV with pseudonormal filling pattern LV ejection fraction = 50-55%.  The left atrium is moderately dilated.  The right atrium is mildly dilated.  There is trace aortic regurgitation.  There is trace mitral regurgitation.  There is mild tricuspid regurgitation.  Mild to moderate pulmonary hypertension.  There is no pericardial effusion.  There is no significant change in comparison with the last study.  RVSP 50      Assessment & Plan:   L strep empyema, resolved, minimal residual chronic effusion  RUL nodule 678m BLE edema  Plan: - ct chest in 6 months - pneumonia shot today if we have it - compression stockings, leg elevation, mobilization with PT for leg swelling. If leg swelling persists, especially if he also has worsening trouble breathing when he lies down, call our clinic to make appointment. - see you in 6 months or sooner if need be!     NaMaryjane HurterMD [facility] 04/06/2022 1:46 PM

## 2022-04-06 ENCOUNTER — Encounter: Payer: Self-pay | Admitting: Student

## 2022-04-06 ENCOUNTER — Ambulatory Visit: Payer: Medicare Other | Admitting: Student

## 2022-04-06 ENCOUNTER — Ambulatory Visit (INDEPENDENT_AMBULATORY_CARE_PROVIDER_SITE_OTHER): Payer: Medicare Other

## 2022-04-06 VITALS — BP 136/58 | HR 57 | Temp 98.0°F | Ht 72.0 in | Wt 173.8 lb

## 2022-04-06 DIAGNOSIS — J90 Pleural effusion, not elsewhere classified: Secondary | ICD-10-CM

## 2022-04-06 DIAGNOSIS — R911 Solitary pulmonary nodule: Secondary | ICD-10-CM | POA: Diagnosis not present

## 2022-04-06 DIAGNOSIS — Z23 Encounter for immunization: Secondary | ICD-10-CM

## 2022-04-06 NOTE — Patient Instructions (Signed)
-   ct chest in 6 months - pneumonia shot today if we have it - compression stockings, leg elevation, mobilization with PT for leg swelling. If leg swelling persists, especially if he also has worsening trouble breathing when he lies down, call our clinic to make appointment. - see you in 6 months or sooner if need be!

## 2022-04-10 ENCOUNTER — Ambulatory Visit: Payer: PPO | Admitting: Family Medicine

## 2022-04-27 DIAGNOSIS — N182 Chronic kidney disease, stage 2 (mild): Secondary | ICD-10-CM | POA: Diagnosis not present

## 2022-04-27 DIAGNOSIS — R5383 Other fatigue: Secondary | ICD-10-CM | POA: Diagnosis not present

## 2022-04-27 DIAGNOSIS — C439 Malignant melanoma of skin, unspecified: Secondary | ICD-10-CM | POA: Diagnosis not present

## 2022-04-27 DIAGNOSIS — I10 Essential (primary) hypertension: Secondary | ICD-10-CM | POA: Diagnosis not present

## 2022-04-27 DIAGNOSIS — M21372 Foot drop, left foot: Secondary | ICD-10-CM | POA: Diagnosis not present

## 2022-04-27 DIAGNOSIS — E782 Mixed hyperlipidemia: Secondary | ICD-10-CM | POA: Diagnosis not present

## 2022-04-28 DIAGNOSIS — E876 Hypokalemia: Secondary | ICD-10-CM | POA: Diagnosis not present

## 2022-04-28 DIAGNOSIS — Z87891 Personal history of nicotine dependence: Secondary | ICD-10-CM | POA: Diagnosis not present

## 2022-04-28 DIAGNOSIS — E43 Unspecified severe protein-calorie malnutrition: Secondary | ICD-10-CM | POA: Diagnosis not present

## 2022-04-28 DIAGNOSIS — K5900 Constipation, unspecified: Secondary | ICD-10-CM | POA: Diagnosis not present

## 2022-04-28 DIAGNOSIS — D631 Anemia in chronic kidney disease: Secondary | ICD-10-CM | POA: Diagnosis not present

## 2022-04-28 DIAGNOSIS — F03918 Unspecified dementia, unspecified severity, with other behavioral disturbance: Secondary | ICD-10-CM | POA: Diagnosis not present

## 2022-04-28 DIAGNOSIS — J9601 Acute respiratory failure with hypoxia: Secondary | ICD-10-CM | POA: Diagnosis not present

## 2022-04-28 DIAGNOSIS — Z7982 Long term (current) use of aspirin: Secondary | ICD-10-CM | POA: Diagnosis not present

## 2022-04-28 DIAGNOSIS — Z85038 Personal history of other malignant neoplasm of large intestine: Secondary | ICD-10-CM | POA: Diagnosis not present

## 2022-04-28 DIAGNOSIS — C439 Malignant melanoma of skin, unspecified: Secondary | ICD-10-CM | POA: Diagnosis not present

## 2022-04-28 DIAGNOSIS — Z7951 Long term (current) use of inhaled steroids: Secondary | ICD-10-CM | POA: Diagnosis not present

## 2022-04-28 DIAGNOSIS — N1831 Chronic kidney disease, stage 3a: Secondary | ICD-10-CM | POA: Diagnosis not present

## 2022-04-28 DIAGNOSIS — Z79899 Other long term (current) drug therapy: Secondary | ICD-10-CM | POA: Diagnosis not present

## 2022-04-28 DIAGNOSIS — G9341 Metabolic encephalopathy: Secondary | ICD-10-CM | POA: Diagnosis not present

## 2022-04-28 DIAGNOSIS — Z9181 History of falling: Secondary | ICD-10-CM | POA: Diagnosis not present

## 2022-04-28 DIAGNOSIS — I129 Hypertensive chronic kidney disease with stage 1 through stage 4 chronic kidney disease, or unspecified chronic kidney disease: Secondary | ICD-10-CM | POA: Diagnosis not present

## 2022-05-06 DIAGNOSIS — Z85038 Personal history of other malignant neoplasm of large intestine: Secondary | ICD-10-CM | POA: Diagnosis not present

## 2022-05-06 DIAGNOSIS — C439 Malignant melanoma of skin, unspecified: Secondary | ICD-10-CM | POA: Diagnosis not present

## 2022-05-06 DIAGNOSIS — Z87891 Personal history of nicotine dependence: Secondary | ICD-10-CM | POA: Diagnosis not present

## 2022-05-06 DIAGNOSIS — E43 Unspecified severe protein-calorie malnutrition: Secondary | ICD-10-CM | POA: Diagnosis not present

## 2022-05-06 DIAGNOSIS — Z7982 Long term (current) use of aspirin: Secondary | ICD-10-CM | POA: Diagnosis not present

## 2022-05-06 DIAGNOSIS — Z7951 Long term (current) use of inhaled steroids: Secondary | ICD-10-CM | POA: Diagnosis not present

## 2022-05-06 DIAGNOSIS — I129 Hypertensive chronic kidney disease with stage 1 through stage 4 chronic kidney disease, or unspecified chronic kidney disease: Secondary | ICD-10-CM | POA: Diagnosis not present

## 2022-05-06 DIAGNOSIS — J9601 Acute respiratory failure with hypoxia: Secondary | ICD-10-CM | POA: Diagnosis not present

## 2022-05-06 DIAGNOSIS — D631 Anemia in chronic kidney disease: Secondary | ICD-10-CM | POA: Diagnosis not present

## 2022-05-06 DIAGNOSIS — G9341 Metabolic encephalopathy: Secondary | ICD-10-CM | POA: Diagnosis not present

## 2022-05-06 DIAGNOSIS — E876 Hypokalemia: Secondary | ICD-10-CM | POA: Diagnosis not present

## 2022-05-06 DIAGNOSIS — N1831 Chronic kidney disease, stage 3a: Secondary | ICD-10-CM | POA: Diagnosis not present

## 2022-05-06 DIAGNOSIS — K5900 Constipation, unspecified: Secondary | ICD-10-CM | POA: Diagnosis not present

## 2022-05-06 DIAGNOSIS — F03918 Unspecified dementia, unspecified severity, with other behavioral disturbance: Secondary | ICD-10-CM | POA: Diagnosis not present

## 2022-05-06 DIAGNOSIS — Z9181 History of falling: Secondary | ICD-10-CM | POA: Diagnosis not present

## 2022-05-06 DIAGNOSIS — Z79899 Other long term (current) drug therapy: Secondary | ICD-10-CM | POA: Diagnosis not present

## 2022-05-08 ENCOUNTER — Ambulatory Visit (INDEPENDENT_AMBULATORY_CARE_PROVIDER_SITE_OTHER): Payer: Medicare Other

## 2022-05-08 ENCOUNTER — Ambulatory Visit: Payer: Medicare Other | Admitting: Primary Care

## 2022-05-08 ENCOUNTER — Telehealth: Payer: Self-pay | Admitting: Student

## 2022-05-08 ENCOUNTER — Encounter: Payer: Self-pay | Admitting: Primary Care

## 2022-05-08 VITALS — BP 132/58 | HR 54 | Temp 97.8°F | Ht 72.0 in | Wt 178.2 lb

## 2022-05-08 DIAGNOSIS — J90 Pleural effusion, not elsewhere classified: Secondary | ICD-10-CM | POA: Diagnosis not present

## 2022-05-08 DIAGNOSIS — R911 Solitary pulmonary nodule: Secondary | ICD-10-CM | POA: Diagnosis not present

## 2022-05-08 DIAGNOSIS — R053 Chronic cough: Secondary | ICD-10-CM | POA: Diagnosis not present

## 2022-05-08 DIAGNOSIS — R059 Cough, unspecified: Secondary | ICD-10-CM | POA: Diagnosis not present

## 2022-05-08 DIAGNOSIS — R1310 Dysphagia, unspecified: Secondary | ICD-10-CM

## 2022-05-08 DIAGNOSIS — J9811 Atelectasis: Secondary | ICD-10-CM | POA: Diagnosis not present

## 2022-05-08 DIAGNOSIS — J189 Pneumonia, unspecified organism: Secondary | ICD-10-CM | POA: Diagnosis not present

## 2022-05-08 HISTORY — DX: Dysphagia, unspecified: R13.10

## 2022-05-08 MED ORDER — GUAIFENESIN ER 600 MG PO TB12: 600.0000 mg | ORAL_TABLET | Freq: Two times a day (BID) | ORAL | 1 refills | Status: DC

## 2022-05-08 MED ORDER — OMEPRAZOLE 20 MG PO CPDR: 20.0000 mg | DELAYED_RELEASE_CAPSULE | Freq: Two times a day (BID) | ORAL | 1 refills | Status: DC

## 2022-05-08 NOTE — Telephone Encounter (Deleted)
Ok, happy to see him just not typical for pulmonary to manage swallowing issues and its not mentioned in Dr. Glenetta Hew note. Thanks for calling, was trying to save him an unnecessary visit when he likely needs to see GI. We can discuss cough and sob, that is new

## 2022-05-08 NOTE — Telephone Encounter (Signed)
Patient would like to come in before 4:00 pm today. Patient having swallowing issues. Patient phone number is [phone] and [phone].

## 2022-05-08 NOTE — Assessment & Plan Note (Addendum)
-   Patient had difficulty swallowing this morning. He had cough and chest discomfort at the same time. He needs further work up. Recommend getting barium swallow and CXR today. Advised he start omeprazole 20mg  BID for possible GERD/reflux contributing to dysphagia and mucinex 600mg  twice daily to loosen congestion. He has a follow-up on Monday 3/18 with Dr. Verlee Monte.

## 2022-05-08 NOTE — Telephone Encounter (Signed)
Spoke with the pt He refused to cancel appt  He says that the pulmonary doc that seen him before he was d/c from the hospital was concerned about swallowing issue  He also states that he gets SOB and coughing spells when has these episodes Pt will keep appt for 4 pm today with [facility]

## 2022-05-08 NOTE — Telephone Encounter (Addendum)
Patient was put on schedule for acute visit d/t trouble swallowing. I would recommend he contact his PCP if he is having trouble swallowing/ not lung issue most likely

## 2022-05-08 NOTE — Patient Instructions (Addendum)
Recommendations: Start omeprazole 20mg  twice daily  Start mucinex 600mg  twice daily   Orders: CXR today (ordered) Barium swallow (ordered) DME - nebulizer machine   Follow-up: Keep apt with Dr. Verlee Monte on Monday

## 2022-05-08 NOTE — Progress Notes (Signed)
Please let patient know CXR shows persistent small left pleural effusion. This should not have caused his trouble swallowing this morning. No urgent findings on imaging. No changes to plan. Keep apt on Monday with Dr. Verlee Monte

## 2022-05-08 NOTE — Progress Notes (Unsigned)
Synopsis: Referred for empyema by Arliss Journey Gr*  Subjective:   PATIENT ID: David [name] GENDER: male DOB: 1933-11-29, MRN: [id number]  No chief complaint on file.  88yM with history of HTN, CKD3, PH, arthritis seen recently for CAP and strep empyema to finish course of augmentin for 4 weeks.   He says he is doing ok now. Does have some swelling in his ankles. Lives at home now. Did SNF for 2 weeks. Did just start seeing PT with home health. Does have some BLE swelling.   He has no cough. No fever.   Otherwise pertinent review of systems is negative.  He has no family history of lung cancer  He smoked for 20 years, 2ppd, quit 1970. He worked as a Occupational hygienist.   Interval HPI Came in for acute visit 05/08/22 for trouble swallowing, cough and chest discomfort - started on omeprazole 20 bid, mucinex 600 bid, MBS ordered  Past Medical History:  Diagnosis Date   Hypertension      Family History  Problem Relation Age of Onset   Cancer Mother        breast   Heart disease Father        heart attack at age 73     Past Surgical History:  Procedure Laterality Date   COLON SURGERY     KNEE SURGERY  2009 or 2010   replaced knee cap   PROSTATE SURGERY  2012    Social History   Socioeconomic History   Marital status: Married    Spouse name: Hoyle Sauer   Number of children: 3   Years of education: 12   Highest education level: Not on file  Occupational History    Comment: retired  Tobacco Use   Smoking status: Former    Packs/day: 2.00    Years: 20.00    Additional pack years: 0.00    Total pack years: 40.00    Types: Cigarettes    Quit date: 02/24/1968    Years since quitting: 54.2   Smokeless tobacco: Never  Vaping Use   Vaping Use: Never used  Substance and Sexual Activity   Alcohol use: No   Drug use: No   Sexual activity: Not on file  Other Topics Concern   Not on file  Social History Narrative   Lives at home with spouse    Caffeine use- occass coffee, tea   Social Determinants of Health   Financial Resource Strain: Not on file  Food Insecurity: No Food Insecurity (02/27/2022)   Hunger Vital Sign    Worried About Running Out of Food in the Last Year: Never true    Ran Out of Food in the Last Year: Never true  Transportation Needs: No Transportation Needs (02/27/2022)   PRAPARE - Hydrologist (Medical): No    Lack of Transportation (Non-Medical): No  Physical Activity: Not on file  Stress: Not on file  Social Connections: Not on file  Intimate Partner Violence: Not At Risk (02/27/2022)   Humiliation, Afraid, Rape, and Kick questionnaire    Fear of Current or Ex-Partner: No    Emotionally Abused: No    Physically Abused: No    Sexually Abused: No     No Known Allergies   Outpatient Medications Prior to Visit  Medication Sig Dispense Refill   Amino Acids (AMINO ACID PO) Take 1 capsule by mouth daily.     amLODipine (NORVASC) 10 MG tablet Take 10 mg by mouth  See admin instructions. Take 10 mg by mouth in the morning only if Hydralazine is not effective     Ascorbic Acid (VITAMIN C) 1000 MG tablet Take 1,000 mg by mouth daily.     aspirin EC 81 MG tablet Take 81 mg by mouth in the morning. Swallow whole.     B Complex Vitamins (VITAMIN B-COMPLEX) TABS Take 1 tablet by mouth every morning.     Cholecalciferol (VITAMIN D3) 50 MCG (2000 UT) TABS Take 2,000 Units by mouth daily.     Coenzyme Q10 (CO Q10 PO) Take 30 mLs by mouth daily.     Cyanocobalamin (VITAMIN B-12) 2500 MCG SUBL Place 2,500 mcg under the tongue daily.     ECHINACEA-ZINC-VITAMIN C PO Take 1 tablet by mouth daily.     ferrous sulfate 325 (65 FE) MG tablet Take 325 mg by mouth daily with breakfast.     finasteride (PROPECIA) 1 MG tablet Take 1 mg by mouth daily.     GARLIC PO Take by mouth.     GLUCOSAMINE CHONDROITIN MSM PO Take 30 mLs by mouth daily.     guaiFENesin (MUCINEX) 600 MG 12 hr tablet Take 1 tablet (600  mg total) by mouth 2 (two) times daily. 60 tablet 1   hydrALAZINE (APRESOLINE) 10 MG tablet Take 10 mg by mouth 3 (three) times daily as needed (if Systolic number is 99991111 or greater).     ipratropium-albuterol (DUONEB) 0.5-2.5 (3) MG/3ML SOLN Take 3 mLs by nebulization every 6 (six) hours as needed. 360 mL    Krill Oil (OMEGA-3) 500 MG CAPS Take 1 capsule by mouth daily.     losartan-hydrochlorothiazide (HYZAAR) 100-25 MG tablet Take 1 tablet by mouth daily.     MAGNESIUM GLYCINATE PO Take 240 mg by mouth daily.     Omega-3 Fatty Acids (FISH OIL OMEGA-3 PO) Take 1 capsule by mouth daily.     omeprazole (PRILOSEC) 20 MG capsule Take 1 capsule (20 mg total) by mouth 2 (two) times daily before a meal. 60 capsule 1   potassium chloride (KLOR-CON) 20 MEQ packet Take by mouth.     Probiotic Product (PROBIOTIC PO) Take 1 capsule by mouth daily.     rosuvastatin (CRESTOR) 20 MG tablet Take 20 mg by mouth daily.     spironolactone (ALDACTONE) 25 MG tablet Take 1 tablet by mouth daily.     SUPER B COMPLEX/C PO Take 1 tablet by mouth daily.     triamcinolone cream (KENALOG) 0.1 % Apply 1 Application topically 2 (two) times daily as needed (to legs/ankles- for itching).     Zinc 50 MG TABS Take 50 mg by mouth daily.     No facility-administered medications prior to visit.       Objective:   Physical Exam:  General appearance: 89 y.o., male, NAD, conversant  Eyes: anicteric sclerae; PERRL, tracking appropriately HENT: NCAT; MMM Neck: Trachea midline; no lymphadenopathy, no JVD Lungs: CTAB, no crackles, no wheeze, with normal respiratory effort CV: RRR, no murmur  Abdomen: Soft, non-tender; non-distended, BS present  Extremities: No peripheral edema, warm Skin: Normal turgor and texture; no rash Psych: Appropriate affect Neuro: Alert and oriented to person and place, no focal deficit     There were no vitals filed for this visit.    on RA BMI Readings from Last 3 Encounters:  05/08/22  24.17 kg/m  04/06/22 23.57 kg/m  02/27/22 22.93 kg/m   Wt Readings from Last 3 Encounters:  05/08/22 178 lb  3.2 oz (80.8 kg)  04/06/22 173 lb 12.8 oz (78.8 kg)  02/27/22 169 lb 1.5 oz (76.7 kg)     CBC    Component Value Date/Time   WBC 20.0 (H) 03/09/2022 0406   RBC 3.30 (L) 03/09/2022 0406   HGB 9.1 (L) 03/09/2022 0406   HCT 28.3 (L) 03/09/2022 0406   PLT 348 03/09/2022 0406   MCV 85.8 03/09/2022 0406   MCH 27.6 03/09/2022 0406   MCHC 32.2 03/09/2022 0406   RDW 15.5 03/09/2022 0406   LYMPHSABS 0.3 (L) 02/26/2022 1307   MONOABS 2.7 (H) 02/26/2022 1307   EOSABS 0.0 02/26/2022 1307   BASOSABS 0.1 02/26/2022 1307    Pleural fluid cyto negative 02/26/22  Chest Imaging: CT Chest 03/03/22 with 43mm RUL nodule, marked interval reduction in size of L empyema, some ptx component remains  CXR 05/08/22 better aeration LLL, residual effusion  Pulmonary Functions Testing Results:     No data to display           Echocardiogram:   Left ventricular systolic function is normal.  LV with pseudonormal filling pattern LV ejection fraction = 50-55%.  The left atrium is moderately dilated.  The right atrium is mildly dilated.  There is trace aortic regurgitation.  There is trace mitral regurgitation.  There is mild tricuspid regurgitation.  Mild to moderate pulmonary hypertension.  There is no pericardial effusion.  There is no significant change in comparison with the last study.  RVSP 50      Assessment & Plan:   L strep empyema, resolved, minimal residual chronic effusion  RUL nodule 59mm  BLE edema  Plan: - ct chest in 6 months - pneumonia shot today if we have it - compression stockings, leg elevation, mobilization with PT for leg swelling. If leg swelling persists, especially if he also has worsening trouble breathing when he lies down, call our clinic to make appointment. - see you in 6 months or sooner if need be!     Maryjane Hurter, MD [facility] 05/08/2022 5:12 PM

## 2022-05-08 NOTE — Progress Notes (Signed)
@Patient  ID: David [name], male    DOB: 06/04/1933, 89 y.o.   MRN: [id number]  Chief Complaint  Patient presents with   Acute Visit    Referring provider: [facility] [place] David Gr*  HPI: [phone] with history of HTN, CKD3, PH, arthritis seen recently for CAP and strep empyema to finish course of augmentin for 4 weeks.   Previous [facility] pulmonary encounter: He says he is doing ok now. Does have some swelling in his ankles. Lives at home now. Did SNF for 2 weeks. Did just start seeing PT with home health. Does have some BLE swelling.   He has no cough. No fever.   Otherwise pertinent review of systems is negative.  He has no family history of lung cancer  He smoked for 20 years, 2ppd, quit 1970. He worked as a Occupational hygienist.    05/08/2022- Interim hx  Patient presents today for acute visit d/t swallowing issues. Accompanied by his wife. He reports having difficulty swallowing water this moring at breakfast. He had a productive cough and chest discomfort at the time that lasted 30-45 mins. He coughed up a fair amount of thick clear-white sputum. This is the second time this is happened since he had pneumonia in January. He does not take medication for reflux. He has never had swallow study.    No Known Allergies  Immunization History  Administered Date(s) Administered   Hep A / Hep B 02/20/2014, 03/22/2014   Hep A, Unspecified 05/28/2006, 02/20/2014, 03/22/2014   Hep B, Unspecified 02/20/2014, 03/22/2014   Hepatitis A, Ped/Adol-2 Dose 05/28/2006, 02/20/2014, 03/22/2014   Hepatitis B, PED/ADOLESCENT 02/20/2014, 03/22/2014   Influenza-Unspecified 12/21/2003, 01/02/2005, 01/07/2007, 12/10/2016   Janssen (J&J) SARS-COV-2 Vaccination 08/01/2019   Moderna Sars-Covid-2 Vaccination 05/09/2019, 06/07/2019   PNEUMOCOCCAL CONJUGATE-20 04/06/2022   Pneumococcal Conjugate-13 01/09/2014   Pneumococcal Polysaccharide-23 05/07/2015   Td (Adult),5 Lf Tetanus Toxid, Preservative Free  10/21/2004   Tdap 05/28/2006, 02/20/2014   Typhoid Live 02/20/2014   Yellow Fever 03/22/2014   Zoster Recombinat (Shingrix) 10/09/2016, 12/10/2016    Past Medical History:  Diagnosis Date   Hypertension     Tobacco History: Social History   Tobacco Use  Smoking Status Former   Packs/day: 2.00   Years: 20.00   Additional pack years: 0.00   Total pack years: 40.00   Types: Cigarettes   Quit date: 02/24/1968   Years since quitting: 54.2  Smokeless Tobacco Never   Counseling given: Not Answered   Outpatient Medications Prior to Visit  Medication Sig Dispense Refill   Amino Acids (AMINO ACID PO) Take 1 capsule by mouth daily.     amLODipine (NORVASC) 10 MG tablet Take 10 mg by mouth See admin instructions. Take 10 mg by mouth in the morning only if Hydralazine is not effective     Ascorbic Acid (VITAMIN C) 1000 MG tablet Take 1,000 mg by mouth daily.     aspirin EC 81 MG tablet Take 81 mg by mouth in the morning. Swallow whole.     B Complex Vitamins (VITAMIN B-COMPLEX) TABS Take 1 tablet by mouth every morning.     Cholecalciferol (VITAMIN D3) 50 MCG (2000 UT) TABS Take 2,000 Units by mouth daily.     Coenzyme Q10 (CO Q10 PO) Take 30 mLs by mouth daily.     Cyanocobalamin (VITAMIN B-12) 2500 MCG SUBL Place 2,500 mcg under the tongue daily.     ECHINACEA-ZINC-VITAMIN C PO Take 1 tablet by mouth daily.     ferrous  sulfate 325 (65 FE) MG tablet Take 325 mg by mouth daily with breakfast.     finasteride (PROPECIA) 1 MG tablet Take 1 mg by mouth daily.     GARLIC PO Take by mouth.     GLUCOSAMINE CHONDROITIN MSM PO Take 30 mLs by mouth daily.     hydrALAZINE (APRESOLINE) 10 MG tablet Take 10 mg by mouth 3 (three) times daily as needed (if Systolic number is 99991111 or greater).     ipratropium-albuterol (DUONEB) 0.5-2.5 (3) MG/3ML SOLN Take 3 mLs by nebulization every 6 (six) hours as needed. 360 mL    Krill Oil (OMEGA-3) 500 MG CAPS Take 1 capsule by mouth daily.      losartan-hydrochlorothiazide (HYZAAR) 100-25 MG tablet Take 1 tablet by mouth daily.     MAGNESIUM GLYCINATE PO Take 240 mg by mouth daily.     Omega-3 Fatty Acids (FISH OIL OMEGA-3 PO) Take 1 capsule by mouth daily.     potassium chloride (KLOR-CON) 20 MEQ packet Take by mouth.     Probiotic Product (PROBIOTIC PO) Take 1 capsule by mouth daily.     rosuvastatin (CRESTOR) 20 MG tablet Take 20 mg by mouth daily.     spironolactone (ALDACTONE) 25 MG tablet Take 1 tablet by mouth daily.     SUPER B COMPLEX/C PO Take 1 tablet by mouth daily.     triamcinolone cream (KENALOG) 0.1 % Apply 1 Application topically 2 (two) times daily as needed (to legs/ankles- for itching).     Zinc 50 MG TABS Take 50 mg by mouth daily.     guaiFENesin (ROBITUSSIN) 100 MG/5ML liquid Take 5 mLs by mouth every 4 (four) hours as needed for cough or to loosen phlegm. (Patient not taking: Reported on 05/08/2022) 120 mL 0   No facility-administered medications prior to visit.   Review of Systems  Review of Systems  Constitutional: Negative.   HENT: Negative.    Respiratory:  Positive for cough. Negative for shortness of breath and wheezing.   Cardiovascular: Negative.    Physical Exam  BP (!) 132/58 (BP Location: Left Arm, Patient Position: Sitting, Cuff Size: Normal)   Pulse (!) 54   Temp 97.8 F (36.6 C) (Oral)   Ht 6' (1.829 m)   Wt 178 lb 3.2 oz (80.8 kg)   SpO2 98%   BMI 24.17 kg/m  Physical Exam Constitutional:      Appearance: Normal appearance.  HENT:     Head: Normocephalic and atraumatic.     Mouth/Throat:     Mouth: Mucous membranes are moist.     Pharynx: Oropharynx is clear.  Cardiovascular:     Rate and Rhythm: Normal rate and regular rhythm.  Pulmonary:     Effort: Pulmonary effort is normal.     Breath sounds: Normal breath sounds. No wheezing or rhonchi.     Comments: Clear to auscultation  Musculoskeletal:        General: Normal range of motion.  Skin:    General: Skin is warm and  dry.  Neurological:     General: No focal deficit present.     Mental Status: He is alert and oriented to person, place, and time. Mental status is at baseline.  Psychiatric:        Mood and Affect: Mood normal.        Behavior: Behavior normal.        Thought Content: Thought content normal.        Judgment: Judgment normal.  Lab Results:  CBC    Component Value Date/Time   WBC 20.0 (H) 03/09/2022 0406   RBC 3.30 (L) 03/09/2022 0406   HGB 9.1 (L) 03/09/2022 0406   HCT 28.3 (L) 03/09/2022 0406   PLT 348 03/09/2022 0406   MCV 85.8 03/09/2022 0406   MCH 27.6 03/09/2022 0406   MCHC 32.2 03/09/2022 0406   RDW 15.5 03/09/2022 0406   LYMPHSABS 0.3 (L) 02/26/2022 1307   MONOABS 2.7 (H) 02/26/2022 1307   EOSABS 0.0 02/26/2022 1307   BASOSABS 0.1 02/26/2022 1307    BMET    Component Value Date/Time   NA 136 03/09/2022 0406   K 3.8 03/09/2022 0406   CL 100 03/09/2022 0406   CO2 29 03/09/2022 0406   GLUCOSE 98 03/09/2022 0406   BUN 11 03/09/2022 0406   CREATININE 0.91 03/10/2022 0354   CALCIUM 8.1 (L) 03/09/2022 0406   GFRNONAA >60 03/10/2022 0354   GFRAA 54 (L) 09/09/2017 1421    BNP No results found for: "BNP"  ProBNP No results found for: "PROBNP"  Imaging: DG Chest 2 View  Result Date: 05/08/2022 CLINICAL DATA:  Cough; possibly aspirated EXAM: CHEST - 2 VIEW COMPARISON:  04/06/2022 FINDINGS: Persistent small left pleural effusion with adjacent atelectasis laterally at the left lung base. Right lung remains clear. Heart size and mediastinal contours are within normal limits. Aortic Atherosclerosis (ICD10-170.0). No pneumothorax. Vertebral endplate spurring at multiple levels in the mid thoracic spine. IMPRESSION: Persistent small left pleural effusion. Electronically Signed   By: Lucrezia Europe M.D.   On: 05/08/2022 16:31     Assessment & Plan:   Dysphagia - Patient had difficulty swallowing this morning. He had cough and chest discomfort at the same time. He  needs further work up. Recommend getting barium swallow and CXR today. Advised he start omeprazole 20mg  BID for possible GERD/reflux contributing to dysphagia and mucinex 600mg  twice daily to loosen congestion. He has a follow-up on Monday 3/18 with Dr. Verlee Monte.      Martyn Ehrich, NP 05/08/2022

## 2022-05-11 ENCOUNTER — Encounter: Payer: Self-pay | Admitting: Student

## 2022-05-11 ENCOUNTER — Ambulatory Visit: Payer: Medicare Other | Admitting: Student

## 2022-05-11 VITALS — BP 128/64 | HR 52 | Ht 72.0 in | Wt 178.0 lb

## 2022-05-11 DIAGNOSIS — R911 Solitary pulmonary nodule: Secondary | ICD-10-CM

## 2022-05-11 DIAGNOSIS — R079 Chest pain, unspecified: Secondary | ICD-10-CM | POA: Diagnosis not present

## 2022-05-11 NOTE — Patient Instructions (Signed)
-   ct chest in August you'll be called to schedule it - agree with getting swallow evaluation - will be called to schedule it - ok to stop omeprazole as long as you're not having heartburn, central chest pain/discomfort after eating or cough - compression stockings, leg elevation, mobilization with PT for leg swelling. If leg swelling persists, especially if he also has worsening trouble breathing when he lies down, call our clinic to make appointment. - see you in 6 months or sooner if need be!

## 2022-05-12 DIAGNOSIS — Z79899 Other long term (current) drug therapy: Secondary | ICD-10-CM | POA: Diagnosis not present

## 2022-05-12 DIAGNOSIS — J9601 Acute respiratory failure with hypoxia: Secondary | ICD-10-CM | POA: Diagnosis not present

## 2022-05-12 DIAGNOSIS — C439 Malignant melanoma of skin, unspecified: Secondary | ICD-10-CM | POA: Diagnosis not present

## 2022-05-12 DIAGNOSIS — F03918 Unspecified dementia, unspecified severity, with other behavioral disturbance: Secondary | ICD-10-CM | POA: Diagnosis not present

## 2022-05-12 DIAGNOSIS — Z87891 Personal history of nicotine dependence: Secondary | ICD-10-CM | POA: Diagnosis not present

## 2022-05-12 DIAGNOSIS — Z85038 Personal history of other malignant neoplasm of large intestine: Secondary | ICD-10-CM | POA: Diagnosis not present

## 2022-05-12 DIAGNOSIS — E876 Hypokalemia: Secondary | ICD-10-CM | POA: Diagnosis not present

## 2022-05-12 DIAGNOSIS — N1831 Chronic kidney disease, stage 3a: Secondary | ICD-10-CM | POA: Diagnosis not present

## 2022-05-12 DIAGNOSIS — Z7951 Long term (current) use of inhaled steroids: Secondary | ICD-10-CM | POA: Diagnosis not present

## 2022-05-12 DIAGNOSIS — E43 Unspecified severe protein-calorie malnutrition: Secondary | ICD-10-CM | POA: Diagnosis not present

## 2022-05-12 DIAGNOSIS — D631 Anemia in chronic kidney disease: Secondary | ICD-10-CM | POA: Diagnosis not present

## 2022-05-12 DIAGNOSIS — Z9181 History of falling: Secondary | ICD-10-CM | POA: Diagnosis not present

## 2022-05-12 DIAGNOSIS — K5900 Constipation, unspecified: Secondary | ICD-10-CM | POA: Diagnosis not present

## 2022-05-12 DIAGNOSIS — Z7982 Long term (current) use of aspirin: Secondary | ICD-10-CM | POA: Diagnosis not present

## 2022-05-12 DIAGNOSIS — G9341 Metabolic encephalopathy: Secondary | ICD-10-CM | POA: Diagnosis not present

## 2022-05-12 DIAGNOSIS — I129 Hypertensive chronic kidney disease with stage 1 through stage 4 chronic kidney disease, or unspecified chronic kidney disease: Secondary | ICD-10-CM | POA: Diagnosis not present

## 2022-05-13 ENCOUNTER — Telehealth: Payer: Self-pay | Admitting: Primary Care

## 2022-05-13 DIAGNOSIS — Z85038 Personal history of other malignant neoplasm of large intestine: Secondary | ICD-10-CM | POA: Diagnosis not present

## 2022-05-13 DIAGNOSIS — K5790 Diverticulosis of intestine, part unspecified, without perforation or abscess without bleeding: Secondary | ICD-10-CM | POA: Diagnosis not present

## 2022-05-13 DIAGNOSIS — Z1211 Encounter for screening for malignant neoplasm of colon: Secondary | ICD-10-CM | POA: Diagnosis not present

## 2022-05-13 NOTE — Telephone Encounter (Signed)
Beth, please advise. 

## 2022-05-13 NOTE — Telephone Encounter (Signed)
[name], David [name], [name], [name]; [name]; Able, [name] Good morning Per patients' insurance the diagnosis does not qualify for a nebulizer machine. If there is anything else, you need our office to do please let us know.  Thank you!

## 2022-05-13 NOTE — Addendum Note (Signed)
Addended by: Lorretta Harp on: 05/13/2022 12:57 PM   Modules accepted: Orders

## 2022-05-13 NOTE — Telephone Encounter (Signed)
David [name] can you re-enter for Cough/ CAP

## 2022-05-13 NOTE — Assessment & Plan Note (Signed)
-   Hospitalization in January 2024 for CAP and acute respiratory failure. He has a chronic cough. He was discharged on ipratropium-albuterol and was never given a nebulizer machine. DME order placed for patient to receive nebulizer machine.

## 2022-05-13 NOTE — Telephone Encounter (Signed)
Addendum done on order that was placed at [facility] having diagnosis of cough and CAP. Routing to [employer] as an Pharmacist, hospital

## 2022-05-13 NOTE — Telephone Encounter (Signed)
He was hospitalized in January for CAP and acute respiratory failure. He was discharged on ipratropium-albuterol and was never given a nebulizer machine, I placed order. I have revised note

## 2022-05-14 NOTE — Telephone Encounter (Signed)
[name], Jonn Shingles, Raven N; [place], [place]; Gala Lewandowsky; [name]; Able, [name] Good morning Will have neb order processed.  Thank you so much!

## 2022-05-25 DIAGNOSIS — F03918 Unspecified dementia, unspecified severity, with other behavioral disturbance: Secondary | ICD-10-CM | POA: Diagnosis not present

## 2022-05-25 DIAGNOSIS — Z87891 Personal history of nicotine dependence: Secondary | ICD-10-CM | POA: Diagnosis not present

## 2022-05-25 DIAGNOSIS — I129 Hypertensive chronic kidney disease with stage 1 through stage 4 chronic kidney disease, or unspecified chronic kidney disease: Secondary | ICD-10-CM | POA: Diagnosis not present

## 2022-05-25 DIAGNOSIS — K5900 Constipation, unspecified: Secondary | ICD-10-CM | POA: Diagnosis not present

## 2022-05-25 DIAGNOSIS — E876 Hypokalemia: Secondary | ICD-10-CM | POA: Diagnosis not present

## 2022-05-25 DIAGNOSIS — Z9181 History of falling: Secondary | ICD-10-CM | POA: Diagnosis not present

## 2022-05-25 DIAGNOSIS — C439 Malignant melanoma of skin, unspecified: Secondary | ICD-10-CM | POA: Diagnosis not present

## 2022-05-25 DIAGNOSIS — D631 Anemia in chronic kidney disease: Secondary | ICD-10-CM | POA: Diagnosis not present

## 2022-05-25 DIAGNOSIS — Z7982 Long term (current) use of aspirin: Secondary | ICD-10-CM | POA: Diagnosis not present

## 2022-05-25 DIAGNOSIS — N1831 Chronic kidney disease, stage 3a: Secondary | ICD-10-CM | POA: Diagnosis not present

## 2022-05-25 DIAGNOSIS — Z79899 Other long term (current) drug therapy: Secondary | ICD-10-CM | POA: Diagnosis not present

## 2022-05-25 DIAGNOSIS — E43 Unspecified severe protein-calorie malnutrition: Secondary | ICD-10-CM | POA: Diagnosis not present

## 2022-05-25 DIAGNOSIS — Z7951 Long term (current) use of inhaled steroids: Secondary | ICD-10-CM | POA: Diagnosis not present

## 2022-05-25 DIAGNOSIS — J9601 Acute respiratory failure with hypoxia: Secondary | ICD-10-CM | POA: Diagnosis not present

## 2022-05-25 DIAGNOSIS — Z85038 Personal history of other malignant neoplasm of large intestine: Secondary | ICD-10-CM | POA: Diagnosis not present

## 2022-05-25 DIAGNOSIS — G9341 Metabolic encephalopathy: Secondary | ICD-10-CM | POA: Diagnosis not present

## 2022-06-02 ENCOUNTER — Other Ambulatory Visit (HOSPITAL_COMMUNITY): Payer: Self-pay

## 2022-06-02 DIAGNOSIS — R1310 Dysphagia, unspecified: Secondary | ICD-10-CM

## 2022-06-03 DIAGNOSIS — R5383 Other fatigue: Secondary | ICD-10-CM | POA: Diagnosis not present

## 2022-06-03 DIAGNOSIS — I10 Essential (primary) hypertension: Secondary | ICD-10-CM | POA: Diagnosis not present

## 2022-06-03 DIAGNOSIS — E782 Mixed hyperlipidemia: Secondary | ICD-10-CM | POA: Diagnosis not present

## 2022-06-09 ENCOUNTER — Ambulatory Visit (HOSPITAL_COMMUNITY)
Admission: RE | Admit: 2022-06-09 | Discharge: 2022-06-09 | Disposition: A | Discharge status: Home / Self Care | Payer: Medicare Other | Source: Ambulatory Visit | Attending: Internal Medicine | Admitting: Internal Medicine

## 2022-06-09 DIAGNOSIS — R1310 Dysphagia, unspecified: Secondary | ICD-10-CM | POA: Diagnosis not present

## 2022-06-09 DIAGNOSIS — R053 Chronic cough: Secondary | ICD-10-CM

## 2022-06-09 NOTE — Progress Notes (Signed)
Modified Barium Swallow Study  Patient Details  Name: David [name] MRN: [id number] Date of Birth: May 29, 1933  Today's Date: 06/09/2022  Modified Barium Swallow completed.  Full report located under Chart Review in the Imaging Section.  History of Present Illness Pt arrives for an OP MBS due to two episodes of food lodging in throat. Pt says both happened with solid foods at breakfast. The first was mild, but the soncd was severe chest pain for 30 minutes.   Clinical Impression Pt demonstrates no overt oropharyngeal dysphagia, but is observed to have a prominent cricopharyngeus muscle. There was no significant residue at that site during this exam even with pill and solids. Esophageal sweep WNL. Though no impairment seen, presume pt had food lodge in the esophagus (not the airway), likely near CP segment, given that it lasted 30 minutes and he remained conscious. Likely accompanied by esopahgeal spasm in response. Pt recommended to continue preventative behaviors, which he already named (small bites, moist foods, slow rate). If problem persists f/u with GI or ENT recommended. Factors that may increase risk of adverse event in presence of aspiration David [name] & David [name] 2021):    Swallow Evaluation Recommendations Recommendations: PO diet PO Diet Recommendation: Regular;Thin liquids (Level 0) Liquid Administration via: Cup;Straw Medication Administration: Whole meds with liquid Supervision: Patient able to self-feed Swallowing strategies  : Slow rate;Small bites/sips;Follow solids with liquids      David [name], David [name] 06/09/2022,2:54 PM

## 2022-06-11 DIAGNOSIS — C439 Malignant melanoma of skin, unspecified: Secondary | ICD-10-CM | POA: Diagnosis not present

## 2022-06-11 DIAGNOSIS — Z0000 Encounter for general adult medical examination without abnormal findings: Secondary | ICD-10-CM | POA: Diagnosis not present

## 2022-06-11 DIAGNOSIS — E782 Mixed hyperlipidemia: Secondary | ICD-10-CM | POA: Diagnosis not present

## 2022-06-11 DIAGNOSIS — N182 Chronic kidney disease, stage 2 (mild): Secondary | ICD-10-CM | POA: Diagnosis not present

## 2022-06-11 DIAGNOSIS — M21372 Foot drop, left foot: Secondary | ICD-10-CM | POA: Diagnosis not present

## 2022-06-12 NOTE — Progress Notes (Signed)
Please let patient know there was no overt dysphagia on barium swallow. Continue aspiration precautions (small bites, moist food, slow rate). If problem persists f/u with GI or ENT recommended.

## 2022-06-17 DIAGNOSIS — Z87891 Personal history of nicotine dependence: Secondary | ICD-10-CM | POA: Diagnosis not present

## 2022-06-17 DIAGNOSIS — I6521 Occlusion and stenosis of right carotid artery: Secondary | ICD-10-CM | POA: Diagnosis not present

## 2022-06-17 DIAGNOSIS — Z859 Personal history of malignant neoplasm, unspecified: Secondary | ICD-10-CM | POA: Diagnosis not present

## 2022-06-17 DIAGNOSIS — I6523 Occlusion and stenosis of bilateral carotid arteries: Secondary | ICD-10-CM | POA: Diagnosis not present

## 2022-06-17 DIAGNOSIS — I129 Hypertensive chronic kidney disease with stage 1 through stage 4 chronic kidney disease, or unspecified chronic kidney disease: Secondary | ICD-10-CM | POA: Diagnosis not present

## 2022-06-17 DIAGNOSIS — I10 Essential (primary) hypertension: Secondary | ICD-10-CM | POA: Diagnosis not present

## 2022-06-17 DIAGNOSIS — E785 Hyperlipidemia, unspecified: Secondary | ICD-10-CM | POA: Diagnosis not present

## 2022-06-17 DIAGNOSIS — N1831 Chronic kidney disease, stage 3a: Secondary | ICD-10-CM | POA: Diagnosis not present

## 2022-06-18 ENCOUNTER — Telehealth: Payer: Self-pay | Admitting: Student

## 2022-06-18 DIAGNOSIS — R1310 Dysphagia, unspecified: Secondary | ICD-10-CM

## 2022-06-18 NOTE — Telephone Encounter (Signed)
David [name] wife states patient having shortness of breath. Pharmacy is [facility]. David [name] phone number is [phone].

## 2022-06-18 NOTE — Telephone Encounter (Signed)
Called and spoke with patient and his wife. He stated that he has been having "almost like choking episodes" randomly for the past few weeks. He completed a swallow study a few weeks ago but was told yesterday that his results were normal.   When these episodes occur, he is not eating or drink anything. He is simply sitting and watching TV and often times it has been hours since he last ate or drank anything. He described feeling like an object has suddenly appeared in his throat and he starts having an intense coughing spell. At the end of the spell, he is able to cough up some clear phlegm. He also becomes SOB during the spell from all of the coughing. Denied any chest pain.   Beth had mentioned in the swallow study results that if he still had symptoms, she would suggest a referral to ENT.   Dr. Thora Lance, can you please advise?

## 2022-06-18 NOTE — Telephone Encounter (Signed)
Referral made to GI.

## 2022-06-18 NOTE — Telephone Encounter (Signed)
Called and spoke with pt and spouse letting them know about the referral to GI being placed by Dr. Thora Lance and both verbalized understanding. Nothing further needed.

## 2022-06-23 DIAGNOSIS — N182 Chronic kidney disease, stage 2 (mild): Secondary | ICD-10-CM | POA: Diagnosis not present

## 2022-06-23 DIAGNOSIS — I10 Essential (primary) hypertension: Secondary | ICD-10-CM | POA: Diagnosis not present

## 2022-06-23 DIAGNOSIS — C439 Malignant melanoma of skin, unspecified: Secondary | ICD-10-CM | POA: Diagnosis not present

## 2022-06-23 DIAGNOSIS — R1310 Dysphagia, unspecified: Secondary | ICD-10-CM | POA: Diagnosis not present

## 2022-06-23 DIAGNOSIS — G4700 Insomnia, unspecified: Secondary | ICD-10-CM | POA: Diagnosis not present

## 2022-06-23 DIAGNOSIS — E782 Mixed hyperlipidemia: Secondary | ICD-10-CM | POA: Diagnosis not present

## 2022-06-23 DIAGNOSIS — R0989 Other specified symptoms and signs involving the circulatory and respiratory systems: Secondary | ICD-10-CM | POA: Diagnosis not present

## 2022-06-23 DIAGNOSIS — M21372 Foot drop, left foot: Secondary | ICD-10-CM | POA: Diagnosis not present

## 2022-06-24 ENCOUNTER — Other Ambulatory Visit: Payer: Self-pay | Admitting: Internal Medicine

## 2022-06-24 DIAGNOSIS — R1319 Other dysphagia: Secondary | ICD-10-CM

## 2022-06-29 ENCOUNTER — Inpatient Hospital Stay: Admission: RE | Admit: 2022-06-29 | Payer: Medicare Other | Source: Ambulatory Visit

## 2022-06-29 ENCOUNTER — Ambulatory Visit
Admission: RE | Admit: 2022-06-29 | Discharge: 2022-06-29 | Disposition: A | Discharge status: Home / Self Care | Payer: Medicare Other | Source: Ambulatory Visit | Attending: Internal Medicine | Admitting: Internal Medicine

## 2022-06-29 ENCOUNTER — Other Ambulatory Visit: Payer: Self-pay | Admitting: Internal Medicine

## 2022-06-29 DIAGNOSIS — R1319 Other dysphagia: Secondary | ICD-10-CM

## 2022-06-29 DIAGNOSIS — R0989 Other specified symptoms and signs involving the circulatory and respiratory systems: Secondary | ICD-10-CM | POA: Diagnosis not present

## 2022-06-29 DIAGNOSIS — K219 Gastro-esophageal reflux disease without esophagitis: Secondary | ICD-10-CM | POA: Diagnosis not present

## 2022-06-30 DIAGNOSIS — R2689 Other abnormalities of gait and mobility: Secondary | ICD-10-CM | POA: Diagnosis not present

## 2022-06-30 DIAGNOSIS — M6281 Muscle weakness (generalized): Secondary | ICD-10-CM | POA: Diagnosis not present

## 2022-06-30 DIAGNOSIS — M21372 Foot drop, left foot: Secondary | ICD-10-CM | POA: Diagnosis not present

## 2022-07-07 DIAGNOSIS — H18413 Arcus senilis, bilateral: Secondary | ICD-10-CM | POA: Diagnosis not present

## 2022-07-07 DIAGNOSIS — Z961 Presence of intraocular lens: Secondary | ICD-10-CM | POA: Diagnosis not present

## 2022-07-07 DIAGNOSIS — H40013 Open angle with borderline findings, low risk, bilateral: Secondary | ICD-10-CM | POA: Diagnosis not present

## 2022-07-07 DIAGNOSIS — H04123 Dry eye syndrome of bilateral lacrimal glands: Secondary | ICD-10-CM | POA: Diagnosis not present

## 2022-07-23 DIAGNOSIS — R001 Bradycardia, unspecified: Secondary | ICD-10-CM | POA: Diagnosis not present

## 2022-07-23 DIAGNOSIS — I453 Trifascicular block: Secondary | ICD-10-CM | POA: Diagnosis not present

## 2022-07-24 DIAGNOSIS — I444 Left anterior fascicular block: Secondary | ICD-10-CM | POA: Diagnosis not present

## 2022-07-24 DIAGNOSIS — I4510 Unspecified right bundle-branch block: Secondary | ICD-10-CM | POA: Diagnosis not present

## 2022-07-24 DIAGNOSIS — I452 Bifascicular block: Secondary | ICD-10-CM | POA: Diagnosis not present

## 2022-07-24 DIAGNOSIS — I440 Atrioventricular block, first degree: Secondary | ICD-10-CM | POA: Diagnosis not present

## 2022-08-12 DIAGNOSIS — R1310 Dysphagia, unspecified: Secondary | ICD-10-CM | POA: Diagnosis not present

## 2022-08-12 DIAGNOSIS — T17308A Unspecified foreign body in larynx causing other injury, initial encounter: Secondary | ICD-10-CM | POA: Diagnosis not present

## 2022-08-13 DIAGNOSIS — R413 Other amnesia: Secondary | ICD-10-CM | POA: Diagnosis not present

## 2022-08-13 DIAGNOSIS — I10 Essential (primary) hypertension: Secondary | ICD-10-CM | POA: Diagnosis not present

## 2022-08-13 DIAGNOSIS — E782 Mixed hyperlipidemia: Secondary | ICD-10-CM | POA: Diagnosis not present

## 2022-08-13 DIAGNOSIS — R5383 Other fatigue: Secondary | ICD-10-CM | POA: Diagnosis not present

## 2022-08-13 DIAGNOSIS — R809 Proteinuria, unspecified: Secondary | ICD-10-CM | POA: Diagnosis not present

## 2022-08-13 DIAGNOSIS — N182 Chronic kidney disease, stage 2 (mild): Secondary | ICD-10-CM | POA: Diagnosis not present

## 2022-08-13 DIAGNOSIS — M21372 Foot drop, left foot: Secondary | ICD-10-CM | POA: Diagnosis not present

## 2022-08-25 DIAGNOSIS — R001 Bradycardia, unspecified: Secondary | ICD-10-CM | POA: Diagnosis not present

## 2022-08-25 DIAGNOSIS — I440 Atrioventricular block, first degree: Secondary | ICD-10-CM | POA: Diagnosis not present

## 2022-08-25 DIAGNOSIS — I452 Bifascicular block: Secondary | ICD-10-CM | POA: Diagnosis not present

## 2022-09-17 ENCOUNTER — Ambulatory Visit
Admission: RE | Admit: 2022-09-17 | Discharge: 2022-09-17 | Disposition: A | Discharge status: Home / Self Care | Payer: Medicare Other | Source: Ambulatory Visit | Attending: Student | Admitting: Student

## 2022-09-17 DIAGNOSIS — R911 Solitary pulmonary nodule: Secondary | ICD-10-CM | POA: Diagnosis not present

## 2022-09-17 DIAGNOSIS — J90 Pleural effusion, not elsewhere classified: Secondary | ICD-10-CM | POA: Diagnosis not present

## 2022-09-21 DIAGNOSIS — R296 Repeated falls: Secondary | ICD-10-CM | POA: Diagnosis not present

## 2022-09-21 DIAGNOSIS — G500 Trigeminal neuralgia: Secondary | ICD-10-CM | POA: Diagnosis not present

## 2022-09-23 DIAGNOSIS — Z85038 Personal history of other malignant neoplasm of large intestine: Secondary | ICD-10-CM | POA: Diagnosis not present

## 2022-09-23 DIAGNOSIS — D126 Benign neoplasm of colon, unspecified: Secondary | ICD-10-CM | POA: Diagnosis not present

## 2022-09-23 DIAGNOSIS — R1313 Dysphagia, pharyngeal phase: Secondary | ICD-10-CM | POA: Diagnosis not present

## 2022-09-23 DIAGNOSIS — R12 Heartburn: Secondary | ICD-10-CM | POA: Diagnosis not present

## 2022-09-23 DIAGNOSIS — Z9049 Acquired absence of other specified parts of digestive tract: Secondary | ICD-10-CM | POA: Diagnosis not present

## 2022-09-23 DIAGNOSIS — R1310 Dysphagia, unspecified: Secondary | ICD-10-CM | POA: Diagnosis not present

## 2022-10-01 DIAGNOSIS — R001 Bradycardia, unspecified: Secondary | ICD-10-CM | POA: Diagnosis not present

## 2022-10-06 DIAGNOSIS — M5137 Other intervertebral disc degeneration, lumbosacral region: Secondary | ICD-10-CM | POA: Diagnosis not present

## 2022-10-06 DIAGNOSIS — M48061 Spinal stenosis, lumbar region without neurogenic claudication: Secondary | ICD-10-CM | POA: Diagnosis not present

## 2022-10-06 DIAGNOSIS — R2689 Other abnormalities of gait and mobility: Secondary | ICD-10-CM | POA: Diagnosis not present

## 2022-10-06 DIAGNOSIS — M5416 Radiculopathy, lumbar region: Secondary | ICD-10-CM | POA: Diagnosis not present

## 2022-10-06 DIAGNOSIS — M4316 Spondylolisthesis, lumbar region: Secondary | ICD-10-CM | POA: Diagnosis not present

## 2022-10-06 DIAGNOSIS — M5136 Other intervertebral disc degeneration, lumbar region: Secondary | ICD-10-CM | POA: Diagnosis not present

## 2022-10-06 DIAGNOSIS — R296 Repeated falls: Secondary | ICD-10-CM | POA: Diagnosis not present

## 2022-10-08 DIAGNOSIS — I4719 Other supraventricular tachycardia: Secondary | ICD-10-CM | POA: Diagnosis not present

## 2022-10-13 DIAGNOSIS — R0781 Pleurodynia: Secondary | ICD-10-CM | POA: Diagnosis not present

## 2022-10-13 DIAGNOSIS — M48061 Spinal stenosis, lumbar region without neurogenic claudication: Secondary | ICD-10-CM | POA: Diagnosis not present

## 2022-10-13 DIAGNOSIS — Z9181 History of falling: Secondary | ICD-10-CM | POA: Diagnosis not present

## 2022-10-22 DIAGNOSIS — I10 Essential (primary) hypertension: Secondary | ICD-10-CM | POA: Diagnosis not present

## 2022-11-03 DIAGNOSIS — M48061 Spinal stenosis, lumbar region without neurogenic claudication: Secondary | ICD-10-CM | POA: Diagnosis not present

## 2022-11-03 DIAGNOSIS — R2689 Other abnormalities of gait and mobility: Secondary | ICD-10-CM | POA: Diagnosis not present

## 2022-11-03 DIAGNOSIS — R296 Repeated falls: Secondary | ICD-10-CM | POA: Diagnosis not present

## 2022-11-04 DIAGNOSIS — R001 Bradycardia, unspecified: Secondary | ICD-10-CM | POA: Diagnosis not present

## 2022-11-10 DIAGNOSIS — M48061 Spinal stenosis, lumbar region without neurogenic claudication: Secondary | ICD-10-CM | POA: Diagnosis not present

## 2022-11-10 DIAGNOSIS — R2689 Other abnormalities of gait and mobility: Secondary | ICD-10-CM | POA: Diagnosis not present

## 2022-11-10 DIAGNOSIS — R296 Repeated falls: Secondary | ICD-10-CM | POA: Diagnosis not present

## 2022-11-12 ENCOUNTER — Ambulatory Visit: Payer: Medicare Other | Admitting: Family Medicine

## 2022-11-12 ENCOUNTER — Encounter: Payer: Self-pay | Admitting: Family Medicine

## 2022-11-12 VITALS — BP 140/66 | HR 77 | Temp 97.8°F | Ht 70.5 in | Wt 189.6 lb

## 2022-11-12 DIAGNOSIS — E559 Vitamin D deficiency, unspecified: Secondary | ICD-10-CM | POA: Diagnosis not present

## 2022-11-12 DIAGNOSIS — N401 Enlarged prostate with lower urinary tract symptoms: Secondary | ICD-10-CM

## 2022-11-12 DIAGNOSIS — N1831 Chronic kidney disease, stage 3a: Secondary | ICD-10-CM

## 2022-11-12 DIAGNOSIS — R809 Proteinuria, unspecified: Secondary | ICD-10-CM

## 2022-11-12 DIAGNOSIS — R413 Other amnesia: Secondary | ICD-10-CM

## 2022-11-12 DIAGNOSIS — R5382 Chronic fatigue, unspecified: Secondary | ICD-10-CM

## 2022-11-12 DIAGNOSIS — I6521 Occlusion and stenosis of right carotid artery: Secondary | ICD-10-CM

## 2022-11-12 DIAGNOSIS — M48061 Spinal stenosis, lumbar region without neurogenic claudication: Secondary | ICD-10-CM

## 2022-11-12 DIAGNOSIS — E782 Mixed hyperlipidemia: Secondary | ICD-10-CM

## 2022-11-12 DIAGNOSIS — N1832 Chronic kidney disease, stage 3b: Secondary | ICD-10-CM | POA: Diagnosis not present

## 2022-11-12 DIAGNOSIS — R001 Bradycardia, unspecified: Secondary | ICD-10-CM | POA: Insufficient documentation

## 2022-11-12 DIAGNOSIS — I10 Essential (primary) hypertension: Secondary | ICD-10-CM

## 2022-11-12 DIAGNOSIS — I454 Nonspecific intraventricular block: Secondary | ICD-10-CM

## 2022-11-12 DIAGNOSIS — J189 Pneumonia, unspecified organism: Secondary | ICD-10-CM

## 2022-11-12 DIAGNOSIS — R1319 Other dysphagia: Secondary | ICD-10-CM

## 2022-11-12 HISTORY — DX: Nonspecific intraventricular block: I45.4

## 2022-11-12 HISTORY — DX: Occlusion and stenosis of right carotid artery: I65.21

## 2022-11-12 HISTORY — DX: Other amnesia: R41.3

## 2022-11-12 HISTORY — DX: Mixed hyperlipidemia: E78.2

## 2022-11-12 HISTORY — DX: Chronic fatigue, unspecified: R53.82

## 2022-11-12 HISTORY — DX: Bradycardia, unspecified: R00.1

## 2022-11-12 HISTORY — DX: Benign prostatic hyperplasia with lower urinary tract symptoms: N40.1

## 2022-11-12 HISTORY — DX: Spinal stenosis, lumbar region without neurogenic claudication: M48.061

## 2022-11-12 HISTORY — DX: Proteinuria, unspecified: R80.9

## 2022-11-12 LAB — CBC WITH DIFFERENTIAL/PLATELET
Basophils Absolute: 0.1 10*3/uL (ref 0.0–0.1)
Basophils Relative: 1 % (ref 0.0–3.0)
Eosinophils Absolute: 0.3 10*3/uL (ref 0.0–0.7)
Eosinophils Relative: 4.5 % (ref 0.0–5.0)
HCT: 41.8 % (ref 39.0–52.0)
Hemoglobin: 13.8 g/dL (ref 13.0–17.0)
Lymphocytes Relative: 14.6 % (ref 12.0–46.0)
Lymphs Abs: 1 10*3/uL (ref 0.7–4.0)
MCHC: 32.9 g/dL (ref 30.0–36.0)
MCV: 88.8 fl (ref 78.0–100.0)
Monocytes Absolute: 0.6 10*3/uL (ref 0.1–1.0)
Monocytes Relative: 9 % (ref 3.0–12.0)
Neutro Abs: 4.8 10*3/uL (ref 1.4–7.7)
Neutrophils Relative %: 70.9 % (ref 43.0–77.0)
Platelets: 217 10*3/uL (ref 150.0–400.0)
RBC: 4.71 Mil/uL (ref 4.22–5.81)
RDW: 14.1 % (ref 11.5–15.5)
WBC: 6.8 10*3/uL (ref 4.0–10.5)

## 2022-11-12 LAB — COMPREHENSIVE METABOLIC PANEL
ALT: 14 U/L (ref 0–53)
AST: 18 U/L (ref 0–37)
Albumin: 4.4 g/dL (ref 3.5–5.2)
Alkaline Phosphatase: 66 U/L (ref 39–117)
BUN: 33 mg/dL — ABNORMAL HIGH (ref 6–23)
CO2: 27 mEq/L (ref 19–32)
Calcium: 10.2 mg/dL (ref 8.4–10.5)
Chloride: 105 mEq/L (ref 96–112)
Creatinine, Ser: 1.79 mg/dL — ABNORMAL HIGH (ref 0.40–1.50)
GFR: 33.64 mL/min — ABNORMAL LOW (ref >60.00)
Glucose, Bld: 86 mg/dL (ref 70–99)
Potassium: 4.8 mEq/L (ref 3.5–5.1)
Sodium: 141 mEq/L (ref 135–145)
Total Bilirubin: 1.1 mg/dL (ref 0.2–1.2)
Total Protein: 6.8 g/dL (ref 6.0–8.3)

## 2022-11-12 LAB — LIPID PANEL
Cholesterol: 126 mg/dL (ref 0–200)
HDL: 57.2 mg/dL (ref >39.00)
LDL Cholesterol: 48 mg/dL (ref 0–99)
NonHDL: 68.72
Total CHOL/HDL Ratio: 2
Triglycerides: 106 mg/dL (ref 0.0–149.0)
VLDL: 21.2 mg/dL (ref 0.0–40.0)

## 2022-11-12 LAB — MICROALBUMIN / CREATININE URINE RATIO
Creatinine,U: 95.8 mg/dL
Microalb Creat Ratio: 2.2 mg/g (ref 0.0–30.0)
Microalb, Ur: 2.1 mg/dL — ABNORMAL HIGH (ref 0.0–1.9)

## 2022-11-12 LAB — HEMOGLOBIN A1C: Hgb A1c MFr Bld: 5.2 % (ref 4.6–6.5)

## 2022-11-12 LAB — TSH: TSH: 2.13 u[IU]/mL (ref 0.35–5.50)

## 2022-11-12 LAB — PSA: PSA: 0.08 ng/mL — ABNORMAL LOW (ref 0.10–4.00)

## 2022-11-12 NOTE — Assessment & Plan Note (Signed)
Check electrolytes and kidney function panel Monitor for changes

## 2022-11-12 NOTE — Patient Instructions (Signed)
Instructions for Blood Pressure:  Take your amlodipine 10 mg dose at night instead of in the morning. Continue taking your losartan hydrochlorothiazide 100/25 mg and spironolactone 25 mg in the morning. Use hydralazine 10 mg up to 3 times a day, if your systolic blood pressure exceeds 150 mmHg Monitor your blood pressure twice daily and record the readings. If after 2 weeks, systolic blood pressure still remains greater than 150 mmHg, move the spironolactone 25 mg to night dosing.  Continue taking amlodipine 10 mg at night and losartan hydrochlorothiazide 100/25 mg in the morning.  Follow-up in 1 month for blood pressure.  Instructions for Other Issues:  Ensure you attend your appointments with neurosurgery as scheduled. Keep track of any swelling, pain, or symptoms that develop and report these during your next visit. Undergo the basic blood work including electrolytes, kidney, liver function, and cholesterol levels as discussed. Follow up with your cardiologist as needed for your cardiac history, particularly electrophysiology. Remain active with your visits to the [facility] and continue any prescribed or recommended physical therapy exercises.

## 2022-11-12 NOTE — Assessment & Plan Note (Signed)
Referred to neurosurgery, awaiting appointment. Instruct to follow up with neurology

## 2022-11-12 NOTE — Progress Notes (Signed)
Assessment/Plan:   Problem List Items Addressed This Visit       Cardiovascular and Mediastinum   HTN (hypertension) - Primary    Patient has fluctuating BP controlled with multiple medications.   Plan: Adjust amlodipine to evening administration. If uncontrolled > 150/90, add spironalactone at night after 2 weeks Regular monitoring, follow-up in 4 weeks      Relevant Orders   TSH   Lipid panel   Hemoglobin A1c   Microalbumin / creatinine urine ratio   CBC with Differential/Platelet   Comprehensive metabolic panel   Urinalysis w microscopic + reflex cultur   Stenosis of right carotid artery   BBB (bundle branch block)     Respiratory   Recurrent pneumonia    Possibly related to aspiration associated with dysphagia.  No acute respiratory issues currently; continue monitoring.  Follow up with pulmonology if needed Monitor for symptoms        Digestive   Dysphagia     Genitourinary   Stage 3a chronic kidney disease (CKD) (HCC)    Check electrolytes and kidney function panel Monitor for changes      Relevant Orders   Vitamin D 1,25 dihydroxy   Benign prostatic hyperplasia with lower urinary tract symptoms   Relevant Medications   finasteride (PROSCAR) 5 MG tablet   Other Relevant Orders   PSA     Other   Proteinuria   Chronic fatigue   Spinal stenosis of lumbar region without neurogenic claudication    Referred to neurosurgery, awaiting appointment. Instruct to follow up with neurology      Bradycardia    Managed by cardiology/electrophysiology.      Mixed hyperlipidemia    Managed with rosuvastatin, monitoring for side effects and efficacy necessary.  Plan: Continue rosuvastatin 20 mg daily Reassess lipid panel next visit      Memory loss due to medical condition    Medications Discontinued During This Encounter  Medication Reason   FINASTERIDE PO Patient Preference    Return in about 4 weeks (around 12/10/2022) for BP.    Subjective:    Encounter date: 11/12/2022  David [name] is a 89 y.o. male who has Rectus diastasis; Community acquired pneumonia of left lower lobe of lung; Acute hypoxic respiratory failure (HCC); Pleural effusion; Stage 3a chronic kidney disease (CKD) (HCC); HTN (hypertension); Normocytic anemia; Recurrent pneumonia; Empyema (HCC); Protein-calorie malnutrition, severe; Dysphagia; Stenosis of right carotid artery; Proteinuria; Chronic fatigue; Spinal stenosis of lumbar region without neurogenic claudication; Bradycardia; Mixed hyperlipidemia; Benign prostatic hyperplasia with lower urinary tract symptoms; Memory loss due to medical condition; and BBB (bundle branch block) on their problem list..   He  has a past medical history of Hypertension..   Chief Complaint: Establish care (No concerns. Fasting).  History of Present Illness:  Hypertension: The patient has a history of hypertension managed with amlodipine (10 mg daily), spironolactone (25 mg daily), and losartan-hydrochlorothiazide (100-25 mg daily). Hydralazine (10 mg) is used as needed if systolic blood pressure exceeds 155 mm Hg. BP readings at home have fluctuated between 144-173 systolic and 51-74 diastolic. Plan includes adjusting medication timing to improve BP control.  Mixed Hyperlipidemia: The patient is on rosuvastatin (20 mg daily), with noted concerns about potential side effects on memory.  Chronic Kidney Disease (Stage 3a): No direct symptoms from CKD discussed.  Recurrent Pneumonia: Recent CT chest without contrast showed significant interval improvement with resolved pleural effusions and no evidence of pneumonia.  Spinal Stenosis of Lumbar Region: Patient is followed  by neurology and referred to neurosurgery but awaiting an appointment.  Review of Systems  Constitutional:  Positive for malaise/fatigue (chronic, unchanged). Negative for chills, diaphoresis, fever and weight loss.  HENT:  Negative for congestion, ear discharge, ear  pain and hearing loss.   Eyes:  Negative for blurred vision, double vision, photophobia, pain, discharge and redness.  Respiratory:  Negative for cough, sputum production, shortness of breath and wheezing.   Cardiovascular:  Positive for leg swelling (ankles occasionally, none today.). Negative for chest pain and palpitations.  Gastrointestinal:  Negative for abdominal pain, blood in stool, constipation, diarrhea, heartburn, melena, nausea and vomiting.  Genitourinary:  Negative for dysuria, flank pain, frequency, hematuria and urgency.       H/o BPH, no endorsed uncontrolled urinary symptoms  Musculoskeletal:  Positive for back pain and falls. Negative for myalgias.  Skin:  Negative for itching and rash.  Neurological:  Negative for dizziness, tingling, tremors, speech change, seizures, loss of consciousness, weakness and headaches.  Psychiatric/Behavioral:  Positive for memory loss (Endorsed by wife). Negative for depression, hallucinations, substance abuse and suicidal ideas. The patient does not have insomnia.   All other systems reviewed and are negative.   Past Surgical History:  Procedure Laterality Date   COLON SURGERY     KNEE SURGERY  2009 or 2010   replaced knee cap   PROSTATE SURGERY  2012    Outpatient Medications Prior to Visit  Medication Sig Dispense Refill   Amino Acids (AMINO ACID PO) Take 1 capsule by mouth daily.     amLODipine (NORVASC) 10 MG tablet Take 10 mg by mouth See admin instructions. Take 10 mg by mouth in the morning only if Hydralazine is not effective     Ascorbic Acid (VITAMIN C) 1000 MG tablet Take 1,000 mg by mouth daily.     aspirin EC 81 MG tablet Take 81 mg by mouth in the morning. Swallow whole.     B Complex Vitamins (VITAMIN B-COMPLEX) TABS Take 1 tablet by mouth every morning.     Cholecalciferol (VITAMIN D3) 50 MCG (2000 UT) TABS Take 2,000 Units by mouth daily.     Coenzyme Q10 (CO Q10 PO) Take 30 mLs by mouth daily.     Cyanocobalamin  (VITAMIN B-12) 2500 MCG SUBL Place 2,500 mcg under the tongue daily.     ECHINACEA-ZINC-VITAMIN C PO Take 1 tablet by mouth daily.     ferrous sulfate 325 (65 FE) MG tablet Take 325 mg by mouth daily with breakfast.     finasteride (PROSCAR) 5 MG tablet Take 5 mg by mouth daily.     GARLIC PO Take by mouth.     GLUCOSAMINE CHONDROITIN MSM PO Take 30 mLs by mouth daily.     guaiFENesin (MUCINEX) 600 MG 12 hr tablet Take 1 tablet (600 mg total) by mouth 2 (two) times daily. 60 tablet 1   hydrALAZINE (APRESOLINE) 10 MG tablet Take 10 mg by mouth 3 (three) times daily as needed (if Systolic number is 155 or greater).     ipratropium-albuterol (DUONEB) 0.5-2.5 (3) MG/3ML SOLN Take 3 mLs by nebulization every 6 (six) hours as needed. 360 mL    Krill Oil (OMEGA-3) 500 MG CAPS Take 1 capsule by mouth daily.     losartan-hydrochlorothiazide (HYZAAR) 100-25 MG tablet Take 1 tablet by mouth daily.     MAGNESIUM GLYCINATE PO Take 240 mg by mouth daily.     Omega-3 Fatty Acids (FISH OIL OMEGA-3 PO) Take 1 capsule by  mouth daily.     omeprazole (PRILOSEC) 20 MG capsule Take 1 capsule (20 mg total) by mouth 2 (two) times daily before a meal. 60 capsule 1   pantoprazole (PROTONIX) 40 MG tablet 1 tablet Orally Once a day for 30 days     potassium chloride (KLOR-CON) 20 MEQ packet Take by mouth.     Probiotic Product (PROBIOTIC PO) Take 1 capsule by mouth daily.     rosuvastatin (CRESTOR) 20 MG tablet Take 20 mg by mouth daily.     spironolactone (ALDACTONE) 25 MG tablet Take 1 tablet by mouth daily.     SUPER B COMPLEX/C PO Take 1 tablet by mouth daily.     triamcinolone cream (KENALOG) 0.1 % Apply 1 Application topically 2 (two) times daily as needed (to legs/ankles- for itching).     Zinc 50 MG TABS Take 50 mg by mouth daily.     FINASTERIDE PO Take 1 mg by mouth daily. (Patient not taking: Reported on 11/12/2022)     No facility-administered medications prior to visit.    Family History  Problem  Relation Age of Onset   Cancer Mother        breast   Heart disease Father        heart attack at age 34    Social History   Socioeconomic History   Marital status: Married    Spouse name: [name]   Number of children: 3   Years of education: 12   Highest education level: Not on file  Occupational History    Comment: retired  Tobacco Use   Smoking status: Former    Current packs/day: 0.00    Average packs/day: 2.0 packs/day for 20.0 years (40.0 ttl pk-yrs)    Types: Cigarettes    Start date: 02/24/1948    Quit date: 02/24/1968    Years since quitting: 54.7    Passive exposure: Never   Smokeless tobacco: Never  Vaping Use   Vaping status: Never Used  Substance and Sexual Activity   Alcohol use: No   Drug use: No   Sexual activity: Not on file  Other Topics Concern   Not on file  Social History Narrative   Lives at home with spouse   Caffeine use- occass coffee, tea   Social Determinants of Health   Financial Resource Strain: Not on file  Food Insecurity: No Food Insecurity (02/27/2022)   Hunger Vital Sign    Worried About Running Out of Food in the Last Year: Never true    Ran Out of Food in the Last Year: Never true  Transportation Needs: No Transportation Needs (02/27/2022)   PRAPARE - Administrator, Civil Service (Medical): No    Lack of Transportation (Non-Medical): No  Physical Activity: Not on file  Stress: Not on file  Social Connections: Unknown (07/04/2021)   Received from [facility]   Social Network    Social Network: Not on file  Intimate Partner Violence: Not At Risk (02/27/2022)   Humiliation, Afraid, Rape, and Kick questionnaire    Fear of Current or Ex-Partner: No    Emotionally Abused: No    Physically Abused: No    Sexually Abused: No  Objective:  Physical Exam: BP (!) 140/66 (BP Location: Right Arm, Patient Position:  Sitting, Cuff Size: Large)   Pulse 77   Temp 97.8 F (36.6 C) (Temporal)   Ht 5' 10.5" (1.791 m)   Wt 189 lb 9.6 oz (86 kg)   SpO2 95%   BMI 26.82 kg/m     Physical Exam Constitutional:      Appearance: Normal appearance.  HENT:     Head: Normocephalic and atraumatic.     Right Ear: Hearing normal.     Left Ear: Hearing normal.     Nose: Nose normal.  Eyes:     General: No scleral icterus.       Right eye: No discharge.        Left eye: No discharge.     Extraocular Movements: Extraocular movements intact.  Cardiovascular:     Rate and Rhythm: Normal rate and regular rhythm.     Heart sounds: Normal heart sounds.  Pulmonary:     Effort: Pulmonary effort is normal.     Breath sounds: Normal breath sounds.  Abdominal:     Palpations: Abdomen is soft.     Tenderness: There is no abdominal tenderness.  Musculoskeletal:     Right lower leg: No edema.     Left lower leg: No edema.  Skin:    General: Skin is warm.     Findings: No rash.  Neurological:     General: No focal deficit present.     Mental Status: He is alert.     Cranial Nerves: No cranial nerve deficit.  Psychiatric:        Mood and Affect: Mood normal.        Behavior: Behavior normal.        Thought Content: Thought content normal.        Judgment: Judgment normal.     CT Chest Wo Contrast  Result Date: 09/25/2022 CLINICAL DATA:  Follow-up lung nodule. EXAM: CT CHEST WITHOUT CONTRAST TECHNIQUE: Multidetector CT imaging of the chest was performed following the standard protocol without IV contrast. RADIATION DOSE REDUCTION: This exam was performed according to the departmental dose-optimization program which includes automated exposure control, adjustment of the mA and/or kV according to patient size and/or use of iterative reconstruction technique. COMPARISON:  Chest CTs, 03/03/2022 and 02/26/2022. FINDINGS: Cardiovascular: Cardiac silhouette is normal in size. No pericardial effusion. Three-vessel coronary  artery calcifications. Mitral valve and aortic valve leaflet calcifications. Great vessels are normal in caliber. Stable aortic atherosclerotic calcifications. Mediastinum/Nodes: No neck base, mediastinal or hilar masses. No enlarged lymph nodes. Trachea esophagus are unremarkable. Lungs/Pleura: Previously noted right upper lung nodule now measured at 5 mm, image 58, series 8. This minimal change in size from prior study may be technical only. No new nodules. Resolved pleural effusions. Opacity at the left base has significantly improved. Minimal residual linear type opacities consistent with atelectasis/scarring mild stable pleuroparenchymal scarring at the apices. No evidence of pneumonia or edema.  No pneumothorax. Upper Abdomen: No acute findings. Musculoskeletal: No fracture or acute finding.  No bone lesion. IMPRESSION: 1. Significant interval improvement. Resolved pleural effusions. Most of the left lung base opacity noted the prior study has also resolved. There is residual subsegmental atelectasis and/or scarring. 2. Right upper lobe pulmonary nodule currently measures 5 mm, previously 6 mm. Recommend 1 additional follow-up chest CT, based on the prior CT scan recommendation, in 18 months to establish longer term stability. 3. No new nodules.  No acute findings. Aortic  Atherosclerosis (ICD10-I70.0). Electronically Signed   By: Amie Portland M.D.   On: 09/25/2022 13:09    No results found for this or any previous visit (from the past 2160 hour(s)).      Garner Nash, MD, MS

## 2022-11-12 NOTE — Assessment & Plan Note (Signed)
Managed by cardiology/electrophysiology.

## 2022-11-12 NOTE — Assessment & Plan Note (Signed)
Managed with rosuvastatin, monitoring for side effects and efficacy necessary.  Plan: Continue rosuvastatin 20 mg daily Reassess lipid panel next visit

## 2022-11-12 NOTE — Assessment & Plan Note (Signed)
Possibly related to aspiration associated with dysphagia.  No acute respiratory issues currently; continue monitoring.  Follow up with pulmonology if needed Monitor for symptoms

## 2022-11-12 NOTE — Addendum Note (Signed)
Addended by: Fanny Bien B on: 11/12/2022 03:09 PM   Modules accepted: Orders

## 2022-11-12 NOTE — Assessment & Plan Note (Signed)
Patient has fluctuating BP controlled with multiple medications.   Plan: Adjust amlodipine to evening administration. If uncontrolled > 150/90, add spironalactone at night after 2 weeks Regular monitoring, follow-up in 4 weeks

## 2022-11-15 LAB — URINALYSIS W MICROSCOPIC + REFLEX CULTURE
Bacteria, UA: NONE SEEN /HPF
Bilirubin Urine: NEGATIVE
Glucose, UA: NEGATIVE
Hgb urine dipstick: NEGATIVE
Hyaline Cast: NONE SEEN /LPF
Ketones, ur: NEGATIVE
Leukocyte Esterase: NEGATIVE
Nitrites, Initial: NEGATIVE
Protein, ur: NEGATIVE
RBC / HPF: NONE SEEN /HPF (ref 0–2)
Specific Gravity, Urine: 1.017 (ref 1.001–1.035)
Squamous Epithelial / HPF: NONE SEEN /HPF (ref <=5)
WBC, UA: NONE SEEN /HPF (ref 0–5)
pH: 7 (ref 5.0–8.0)

## 2022-11-15 LAB — VITAMIN D 1,25 DIHYDROXY
Vitamin D 1, 25 (OH)2 Total: 14 pg/mL — ABNORMAL LOW (ref 18–72)
Vitamin D2 1, 25 (OH)2: <8 pg/mL
Vitamin D3 1, 25 (OH)2: 14 pg/mL

## 2022-11-15 LAB — NO CULTURE INDICATED

## 2022-11-16 ENCOUNTER — Ambulatory Visit: Payer: Medicare Other | Admitting: Pulmonary Disease

## 2022-11-16 ENCOUNTER — Encounter: Payer: Self-pay | Admitting: Pulmonary Disease

## 2022-11-16 VITALS — BP 126/54 | HR 50 | Ht 70.5 in | Wt 193.2 lb

## 2022-11-16 DIAGNOSIS — R911 Solitary pulmonary nodule: Secondary | ICD-10-CM

## 2022-11-16 MED ORDER — VITAMIN D (ERGOCALCIFEROL) 1.25 MG (50000 UNIT) PO CAPS
50000.0000 [IU] | ORAL_CAPSULE | ORAL | 0 refills | Status: DC
Start: 2022-11-16 — End: 2023-03-11

## 2022-11-16 NOTE — Progress Notes (Signed)
Synopsis: Referred for empyema by David Fick, MD  Subjective:   PATIENT ID: David [name] GENDER: male DOB: [birth year], MRN: [id number]  Chief Complaint  Patient presents with   Follow-up    Former Dr Thora Lance pt. Review CT Chest 09/17/22. Breathing is doing well overall.    89yM with history of HTN, CKD3, PH, arthritis seen recently for CAP and strep empyema to finish course of augmentin for 4 weeks.   He says he is doing ok now. Does have some swelling in his ankles. Lives at home now. Did SNF for 2 weeks. Did just start seeing PT with home health. Does have some BLE swelling.   He has no cough. No fever.   He has no family history of lung cancer  He smoked for 20 years, 2ppd, quit 1970. He worked as a Lobbyist.   Interval HPI Came in for acute visit 05/08/22 for trouble swallowing, cough and chest discomfort - started on omeprazole 20 bid, mucinex 600 bid, MBS ordered  He reiterated issues with feeling of CP after eating eggs and drinking milk. Not necessarily that it was getting stuck on the way down.   He has no cough now  He has no trouble breathing, no CP.   Otherwise pertinent review of systems is negative.   Today OV 11/16/2022 He has dyspnea with exertion.  Denies any cough, wheezing, dysphagia or choking.  Recent CT chest scan showed decrease in size of lung nodule from 6 mm to 5 mm.  Past Medical History:  Diagnosis Date   Hypertension      Family History  Problem Relation Age of Onset   Cancer Mother        breast   Heart disease Father        heart attack at age 58     Past Surgical History:  Procedure Laterality Date   COLON SURGERY     KNEE SURGERY  2009 or 2010   replaced knee cap   PROSTATE SURGERY  2012    Social History   Socioeconomic History   Marital status: Married    Spouse name: David [name]   Number of children: 3   Years of education: 12   Highest education level: Not on file  Occupational History     Comment: retired  Tobacco Use   Smoking status: Former    Current packs/day: 0.00    Average packs/day: 2.0 packs/day for 20.0 years (40.0 ttl pk-yrs)    Types: Cigarettes    Start date: 02/24/1948    Quit date: 02/24/1968    Years since quitting: 54.7    Passive exposure: Never   Smokeless tobacco: Never  Vaping Use   Vaping status: Never Used  Substance and Sexual Activity   Alcohol use: No   Drug use: No   Sexual activity: Not on file  Other Topics Concern   Not on file  Social History Narrative   Lives at home with spouse   Caffeine use- occass coffee, tea   Social Determinants of Health   Financial Resource Strain: Not on file  Food Insecurity: No Food Insecurity (02/27/2022)   Hunger Vital Sign    Worried About Running Out of Food in the Last Year: Never true    Ran Out of Food in the Last Year: Never true  Transportation Needs: No Transportation Needs (02/27/2022)   PRAPARE - Administrator, Civil Service (Medical): No    Lack of Transportation (Non-Medical): No  Physical Activity: Not on file  Stress: Not on file  Social Connections: Unknown (07/04/2021)   Received from [facility]   Social Network    Social Network: Not on file  Intimate Partner Violence: Not At Risk (02/27/2022)   Humiliation, Afraid, Rape, and Kick questionnaire    Fear of Current or Ex-Partner: No    Emotionally Abused: No    Physically Abused: No    Sexually Abused: No     No Known Allergies   Outpatient Medications Prior to Visit  Medication Sig Dispense Refill   Amino Acids (AMINO ACID PO) Take 1 capsule by mouth daily.     amLODipine (NORVASC) 10 MG tablet Take 10 mg by mouth at bedtime.     Ascorbic Acid (VITAMIN C) 1000 MG tablet Take 1,000 mg by mouth daily.     aspirin EC 81 MG tablet Take 81 mg by mouth in the morning. Swallow whole.     B Complex Vitamins (VITAMIN B-COMPLEX) TABS Take 1 tablet by mouth every morning.     Cholecalciferol (VITAMIN D3) 50  MCG (2000 UT) TABS Take 2,000 Units by mouth daily.     Coenzyme Q10 (CO Q10 PO) Take 30 mLs by mouth daily.     Cyanocobalamin (VITAMIN B-12) 2500 MCG SUBL Place 2,500 mcg under the tongue daily.     ECHINACEA-ZINC-VITAMIN C PO Take 1 tablet by mouth daily.     ferrous sulfate 325 (65 FE) MG tablet Take 325 mg by mouth daily with breakfast.     finasteride (PROSCAR) 5 MG tablet Take 5 mg by mouth daily.     GARLIC PO Take by mouth.     GLUCOSAMINE CHONDROITIN MSM PO Take 30 mLs by mouth daily.     guaiFENesin (MUCINEX) 600 MG 12 hr tablet Take 1 tablet (600 mg total) by mouth 2 (two) times daily. 60 tablet 1   hydrALAZINE (APRESOLINE) 10 MG tablet Take 10 mg by mouth 3 (three) times daily as needed (if Systolic number is 155 or greater).     ipratropium-albuterol (DUONEB) 0.5-2.5 (3) MG/3ML SOLN Take 3 mLs by nebulization every 6 (six) hours as needed. 360 mL    Krill Oil (OMEGA-3) 500 MG CAPS Take 1 capsule by mouth daily.     losartan-hydrochlorothiazide (HYZAAR) 100-25 MG tablet Take 1 tablet by mouth daily.     MAGNESIUM GLYCINATE PO Take 240 mg by mouth daily.     Omega-3 Fatty Acids (FISH OIL OMEGA-3 PO) Take 1 capsule by mouth daily.     omeprazole (PRILOSEC) 20 MG capsule Take 1 capsule (20 mg total) by mouth 2 (two) times daily before a meal. (Patient taking differently: Take 20 mg by mouth daily.) 60 capsule 1   pantoprazole (PROTONIX) 40 MG tablet 1 tablet Orally Once a day for 30 days     potassium chloride (KLOR-CON) 20 MEQ packet Take by mouth.     Probiotic Product (PROBIOTIC PO) Take 1 capsule by mouth daily.     rosuvastatin (CRESTOR) 20 MG tablet Take 20 mg by mouth daily.     spironolactone (ALDACTONE) 25 MG tablet Take 1 tablet by mouth daily.     SUPER B COMPLEX/C PO Take 1 tablet by mouth daily.     triamcinolone cream (KENALOG) 0.1 % Apply 1 Application topically 2 (two) times daily as needed (to legs/ankles- for itching).     Zinc 50 MG TABS Take 50 mg by mouth daily.      No  facility-administered medications prior to visit.     Objective:   Physical Exam Constitutional:      Appearance: Normal appearance.  Eyes:     Conjunctiva/sclera: Conjunctivae normal.  Cardiovascular:     Rate and Rhythm: Normal rate and regular rhythm.     Heart sounds: No murmur heard. Pulmonary:     Effort: Pulmonary effort is normal.     Breath sounds: Normal breath sounds.  Musculoskeletal:     Right lower leg: No edema.     Left lower leg: No edema.  Neurological:     Mental Status: He is alert.      Vitals:   11/16/22 1035  BP: (!) 126/54  Pulse: (!) 50  SpO2: 98%  Weight: 193 lb 3.2 oz (87.6 kg)  Height: 5' 10.5" (1.791 m)    98% on RA BMI Readings from Last 3 Encounters:  11/16/22 27.33 kg/m  11/12/22 26.82 kg/m  05/11/22 24.14 kg/m   Wt Readings from Last 3 Encounters:  11/16/22 193 lb 3.2 oz (87.6 kg)  11/12/22 189 lb 9.6 oz (86 kg)  05/11/22 178 lb (80.7 kg)     CBC    Component Value Date/Time   WBC 6.8 11/12/2022 1022   RBC 4.71 11/12/2022 1022   HGB 13.8 11/12/2022 1022   HCT 41.8 11/12/2022 1022   PLT 217.0 11/12/2022 1022   MCV 88.8 11/12/2022 1022   MCH 27.6 03/09/2022 0406   MCHC 32.9 11/12/2022 1022   RDW 14.1 11/12/2022 1022   LYMPHSABS 1.0 11/12/2022 1022   MONOABS 0.6 11/12/2022 1022   EOSABS 0.3 11/12/2022 1022   BASOSABS 0.1 11/12/2022 1022    Pleural fluid cyto negative 02/26/22  Chest Imaging: CT Chest 03/03/22 with 6mm RUL nodule, marked interval reduction in size of L empyema, some ptx component remains  CXR 05/08/22 better aeration LLL, residual effusion  Pulmonary Functions Testing Results:     No data to display           Echocardiogram:   Left ventricular systolic function is normal.  LV with pseudonormal filling pattern LV ejection fraction = 50-55%.  The left atrium is moderately dilated.  The right atrium is mildly dilated.  There is trace aortic regurgitation.  There is trace mitral  regurgitation.  There is mild tricuspid regurgitation.  Mild to moderate pulmonary hypertension.  There is no pericardial effusion.  There is no significant change in comparison with the last study.  RVSP 50      Assessment & Plan:    # RUL nodule 5mm   Plan: - Follow up CT Chest in 1 year    Martina Sinner, MD [facility] 11/16/2022 10:59 AM

## 2022-11-16 NOTE — Patient Instructions (Signed)
Your recent CT Chest has improved and the lung nodule is smaller in size, from 6mm to 5mm   We will schedule you for a CT Chest scan in July 2025  Follow up in 1 year

## 2022-11-16 NOTE — Addendum Note (Signed)
Addended by: Fanny Bien B on: 11/16/2022 11:21 AM   Modules accepted: Orders

## 2022-11-23 ENCOUNTER — Encounter: Payer: Self-pay | Admitting: Pulmonary Disease

## 2022-12-03 ENCOUNTER — Other Ambulatory Visit: Payer: Medicare Other

## 2022-12-03 DIAGNOSIS — N1832 Chronic kidney disease, stage 3b: Secondary | ICD-10-CM | POA: Diagnosis not present

## 2022-12-03 DIAGNOSIS — E559 Vitamin D deficiency, unspecified: Secondary | ICD-10-CM | POA: Diagnosis not present

## 2022-12-03 LAB — RENAL FUNCTION PANEL
Albumin: 4.1 g/dL (ref 3.5–5.2)
BUN: 28 mg/dL — ABNORMAL HIGH (ref 6–23)
CO2: 27 meq/L (ref 19–32)
Calcium: 9.6 mg/dL (ref 8.4–10.5)
Chloride: 106 meq/L (ref 96–112)
Creatinine, Ser: 1.44 mg/dL (ref 0.40–1.50)
GFR: 43.65 mL/min — ABNORMAL LOW (ref >60.00)
Glucose, Bld: 99 mg/dL (ref 70–99)
Phosphorus: 3.6 mg/dL (ref 2.3–4.6)
Potassium: 4.5 meq/L (ref 3.5–5.1)
Sodium: 139 meq/L (ref 135–145)

## 2022-12-03 LAB — VITAMIN D 25 HYDROXY (VIT D DEFICIENCY, FRACTURES): VITD: 59.99 ng/mL (ref 30.00–100.00)

## 2022-12-04 DIAGNOSIS — M5416 Radiculopathy, lumbar region: Secondary | ICD-10-CM | POA: Diagnosis not present

## 2022-12-15 ENCOUNTER — Encounter: Payer: Self-pay | Admitting: Family Medicine

## 2022-12-15 ENCOUNTER — Ambulatory Visit: Payer: Medicare Other | Admitting: Family Medicine

## 2022-12-15 VITALS — BP 140/78 | HR 67 | Temp 97.2°F | Wt 194.0 lb

## 2022-12-15 DIAGNOSIS — E782 Mixed hyperlipidemia: Secondary | ICD-10-CM

## 2022-12-15 DIAGNOSIS — I454 Nonspecific intraventricular block: Secondary | ICD-10-CM

## 2022-12-15 DIAGNOSIS — Z85038 Personal history of other malignant neoplasm of large intestine: Secondary | ICD-10-CM | POA: Diagnosis not present

## 2022-12-15 DIAGNOSIS — N1831 Chronic kidney disease, stage 3a: Secondary | ICD-10-CM

## 2022-12-15 DIAGNOSIS — E559 Vitamin D deficiency, unspecified: Secondary | ICD-10-CM

## 2022-12-15 DIAGNOSIS — I10 Essential (primary) hypertension: Secondary | ICD-10-CM

## 2022-12-15 NOTE — Patient Instructions (Signed)
Continue taking your blood pressure medications as prescribed Monitor your blood pressure at home regularly and keep a log of your readings to share at your next visit. You will be referred to a specialized blood pressure clinic for further management of your resistant hypertension. Our pharmacy team will try to reach out to you by phone to also help with blood pressure.  Restart taking your daily Vitamin D3 supplements and continue to monitor your levels periodically. Schedule a follow-up fasting lipid panel within the next week to assess your cholesterol levels after a month off rosuvastatin.  In 1 month, follow up to review the results and potentially adjust the dosage if needed.

## 2022-12-15 NOTE — Progress Notes (Unsigned)
Assessment/Plan:   Problem List Items Addressed This Visit   None   There are no discontinued medications.  No follow-ups on file.    Subjective:   Encounter date: 12/15/2022  David [name] is a 89 y.o. male who has Rectus diastasis; Community acquired pneumonia of left lower lobe of lung; Acute hypoxic respiratory failure (HCC); Pleural effusion; Stage 3a chronic kidney disease (CKD) (HCC); HTN (hypertension); Normocytic anemia; Recurrent pneumonia; Empyema (HCC); Protein-calorie malnutrition, severe; Dysphagia; Stenosis of right carotid artery; Proteinuria; Chronic fatigue; Spinal stenosis of lumbar region without neurogenic claudication; Bradycardia; Mixed hyperlipidemia; Benign prostatic hyperplasia with lower urinary tract symptoms; Memory loss due to medical condition; and BBB (bundle branch block) on their problem list..   He  has a past medical history of Hypertension.Marland Kitchen   He presents with chief complaint of Medical Management of Chronic Issues (4 weeks (around 12/10/2022) for BP./Nonfasting. Took last weekly vitamin D on Monday. Patient would like to discuss colonoscopy options ) .   HPI:   ROS  Past Surgical History:  Procedure Laterality Date   COLON SURGERY     KNEE SURGERY  2009 or 2010   replaced knee cap   PROSTATE SURGERY  2012    Outpatient Medications Prior to Visit  Medication Sig Dispense Refill   Amino Acids (AMINO ACID PO) Take 1 capsule by mouth daily.     amLODipine (NORVASC) 10 MG tablet Take 10 mg by mouth at bedtime.     Ascorbic Acid (VITAMIN C) 1000 MG tablet Take 1,000 mg by mouth daily.     aspirin EC 81 MG tablet Take 81 mg by mouth in the morning. Swallow whole.     B Complex Vitamins (VITAMIN B-COMPLEX) TABS Take 1 tablet by mouth every morning.     Cholecalciferol (VITAMIN D3) 50 MCG (2000 UT) TABS Take 2,000 Units by mouth daily.     Coenzyme Q10 (CO Q10 PO) Take 30 mLs by mouth daily.     Cyanocobalamin (VITAMIN B-12) 2500 MCG SUBL  Place 2,500 mcg under the tongue daily.     ECHINACEA-ZINC-VITAMIN C PO Take 1 tablet by mouth daily.     ferrous sulfate 325 (65 FE) MG tablet Take 325 mg by mouth daily with breakfast.     finasteride (PROSCAR) 5 MG tablet Take 5 mg by mouth daily.     GARLIC PO Take by mouth.     GLUCOSAMINE CHONDROITIN MSM PO Take 30 mLs by mouth daily.     guaiFENesin (MUCINEX) 600 MG 12 hr tablet Take 1 tablet (600 mg total) by mouth 2 (two) times daily. 60 tablet 1   hydrALAZINE (APRESOLINE) 10 MG tablet Take 10 mg by mouth 3 (three) times daily as needed (if Systolic number is 155 or greater).     ipratropium-albuterol (DUONEB) 0.5-2.5 (3) MG/3ML SOLN Take 3 mLs by nebulization every 6 (six) hours as needed. 360 mL    Krill Oil (OMEGA-3) 500 MG CAPS Take 1 capsule by mouth daily.     losartan-hydrochlorothiazide (HYZAAR) 100-25 MG tablet Take 1 tablet by mouth daily.     MAGNESIUM GLYCINATE PO Take 240 mg by mouth daily.     Omega-3 Fatty Acids (FISH OIL OMEGA-3 PO) Take 1 capsule by mouth daily.     omeprazole (PRILOSEC) 20 MG capsule Take 1 capsule (20 mg total) by mouth 2 (two) times daily before a meal. (Patient taking differently: Take 20 mg by mouth daily.) 60 capsule 1   pantoprazole (PROTONIX) 40  MG tablet 1 tablet Orally Once a day for 30 days     potassium chloride (KLOR-CON) 20 MEQ packet Take by mouth.     Probiotic Product (PROBIOTIC PO) Take 1 capsule by mouth daily.     rosuvastatin (CRESTOR) 20 MG tablet Take 20 mg by mouth daily.     spironolactone (ALDACTONE) 25 MG tablet Take 1 tablet by mouth daily.     SUPER B COMPLEX/C PO Take 1 tablet by mouth daily.     triamcinolone cream (KENALOG) 0.1 % Apply 1 Application topically 2 (two) times daily as needed (to legs/ankles- for itching).     Zinc 50 MG TABS Take 50 mg by mouth daily.     Vitamin D, Ergocalciferol, (DRISDOL) 1.25 MG (50000 UNIT) CAPS capsule Take 1 capsule (50,000 Units total) by mouth every 7 (seven) days. (Patient not  taking: Reported on 12/15/2022) 5 capsule 0   No facility-administered medications prior to visit.    Family History  Problem Relation Age of Onset   Cancer Mother        breast   Heart disease Father        heart attack at age 36    Social History   Socioeconomic History   Marital status: Married    Spouse name: [name]   Number of children: 3   Years of education: 12   Highest education level: Not on file  Occupational History    Comment: retired  Tobacco Use   Smoking status: Former    Current packs/day: 0.00    Average packs/day: 2.0 packs/day for 20.0 years (40.0 ttl pk-yrs)    Types: Cigarettes    Start date: 02/24/1948    Quit date: 02/24/1968    Years since quitting: 54.8    Passive exposure: Never   Smokeless tobacco: Never  Vaping Use   Vaping status: Never Used  Substance and Sexual Activity   Alcohol use: No   Drug use: No   Sexual activity: Not on file  Other Topics Concern   Not on file  Social History Narrative   Lives at home with spouse   Caffeine use- occass coffee, tea   Social Determinants of Health   Financial Resource Strain: Not on file  Food Insecurity: No Food Insecurity (02/27/2022)   Hunger Vital Sign    Worried About Running Out of Food in the Last Year: Never true    Ran Out of Food in the Last Year: Never true  Transportation Needs: No Transportation Needs (02/27/2022)   PRAPARE - Administrator, Civil Service (Medical): No    Lack of Transportation (Non-Medical): No  Physical Activity: Not on file  Stress: Not on file  Social Connections: Unknown (07/04/2021)   Received from [facility]   Social Network    Social Network: Not on file  Intimate Partner Violence: Not At Risk (02/27/2022)   Humiliation, Afraid, Rape, and Kick questionnaire    Fear of Current or Ex-Partner: No    Emotionally Abused: No    Physically Abused: No    Sexually Abused: No  Objective:  Physical Exam: BP (!) 160/86 (BP Location: Left Arm, Patient Position: Sitting, Cuff Size: Large)   Pulse 67   Temp (!) 97.2 F (36.2 C) (Temporal)   Wt 194 lb (88 kg)   SpO2 99%   BMI 27.44 kg/m     Physical Exam  CT Chest Wo Contrast  Result Date: 09/25/2022 CLINICAL DATA:  Follow-up lung nodule. EXAM: CT CHEST WITHOUT CONTRAST TECHNIQUE: Multidetector CT imaging of the chest was performed following the standard protocol without IV contrast. RADIATION DOSE REDUCTION: This exam was performed according to the departmental dose-optimization program which includes automated exposure control, adjustment of the mA and/or kV according to patient size and/or use of iterative reconstruction technique. COMPARISON:  Chest CTs, 03/03/2022 and 02/26/2022. FINDINGS: Cardiovascular: Cardiac silhouette is normal in size. No pericardial effusion. Three-vessel coronary artery calcifications. Mitral valve and aortic valve leaflet calcifications. Great vessels are normal in caliber. Stable aortic atherosclerotic calcifications. Mediastinum/Nodes: No neck base, mediastinal or hilar masses. No enlarged lymph nodes. Trachea esophagus are unremarkable. Lungs/Pleura: Previously noted right upper lung nodule now measured at 5 mm, image 58, series 8. This minimal change in size from prior study may be technical only. No new nodules. Resolved pleural effusions. Opacity at the left base has significantly improved. Minimal residual linear type opacities consistent with atelectasis/scarring mild stable pleuroparenchymal scarring at the apices. No evidence of pneumonia or edema.  No pneumothorax. Upper Abdomen: No acute findings. Musculoskeletal: No fracture or acute finding.  No bone lesion. IMPRESSION: 1. Significant interval improvement. Resolved pleural effusions. Most of the left lung base opacity noted the prior study has also resolved. There is residual subsegmental  atelectasis and/or scarring. 2. Right upper lobe pulmonary nodule currently measures 5 mm, previously 6 mm. Recommend 1 additional follow-up chest CT, based on the prior CT scan recommendation, in 18 months to establish longer term stability. 3. No new nodules.  No acute findings. Aortic Atherosclerosis (ICD10-I70.0). Electronically Signed   By: Amie Portland M.D.   On: 09/25/2022 13:09    Recent Results (from the past 2160 hour(s))  TSH     Status: None   Collection Time: 11/12/22 10:22 AM  Result Value Ref Range   TSH 2.13 0.35 - 5.50 uIU/mL  Lipid panel     Status: None   Collection Time: 11/12/22 10:22 AM  Result Value Ref Range   Cholesterol 126 0 - 200 mg/dL    Comment: ATP III Classification       Desirable:  < 200 mg/dL               Borderline High:  200 - 239 mg/dL          High:  > = 295 mg/dL   Triglycerides 188.4 0.0 - 149.0 mg/dL    Comment: Normal:  <166 mg/dLBorderline High:  150 - 199 mg/dL   HDL 06.30 >16.01 mg/dL   VLDL 09.3 0.0 - 23.5 mg/dL   LDL Cholesterol 48 0 - 99 mg/dL   Total CHOL/HDL Ratio 2     Comment:                Men          Women1/2 Average Risk     3.4          3.3Average Risk          5.0          4.42X Average Risk          9.6  7.13X Average Risk          15.0          11.0                       NonHDL 68.72     Comment: NOTE:  Non-HDL goal should be 30 mg/dL higher than patient's LDL goal (i.e. LDL goal of < 70 mg/dL, would have non-HDL goal of < 100 mg/dL)  Hemoglobin Z6X     Status: None   Collection Time: 11/12/22 10:22 AM  Result Value Ref Range   Hgb A1c MFr Bld 5.2 4.6 - 6.5 %    Comment: Glycemic Control Guidelines for People with Diabetes:Non Diabetic:  <6%Goal of Therapy: <7%Additional Action Suggested:  >8%   Microalbumin / creatinine urine ratio     Status: Abnormal   Collection Time: 11/12/22 10:22 AM  Result Value Ref Range   Microalb, Ur 2.1 (H) 0.0 - 1.9 mg/dL   Creatinine,U 09.6 mg/dL   Microalb Creat Ratio 2.2 0.0 - 30.0  mg/g  Vitamin D 1,25 dihydroxy     Status: Abnormal   Collection Time: 11/12/22 10:22 AM  Result Value Ref Range   Vitamin D 1, 25 (OH)2 Total 14 (L) 18 - 72 pg/mL   Vitamin D3 1, 25 (OH)2 14 pg/mL   Vitamin D2 1, 25 (OH)2 <8 pg/mL    Comment: (Note) Vitamin D3, 1,25(OH)2 indicates both endogenous  production and supplementation. Vitamin D2, 1,25(OH)2 is  an indicator of exogenous sources, such as diet or  supplementation. Interpretation and therapy are based on  measurement of Vitamin D, 1,25 (OH)2, Total. . This test was developed, and its analytical performance  characteristics have been determined by [employer]. It has not been cleared or approved by the  FDA. This assay has been validated pursuant to the CLIA  regulations and is used for clinical purposes. . For additional information, please refer to http://education.[facility].com/faq/FAQ199 (This link is being provided for  informational/educational purposes only.) . [facility] [address] [place] [address] [id number] Junita Push L. Shedrick Sarli Caul, MD, PhD   CBC with Differential/Platelet     Status: None   Collection Time: 11/12/22 10:22 AM  Result Value Ref Range   WBC 6.8 4.0 - 10.5 K/uL   RBC 4.71 4.22 - 5.81 Mil/uL   Hemoglobin 13.8 13.0 - 17.0 g/dL   HCT 95.6 21.3 - 08.6 %   MCV 88.8 78.0 - 100.0 fl   MCHC 32.9 30.0 - 36.0 g/dL   RDW 57.8 46.9 - 62.9 %   Platelets 217.0 150.0 - 400.0 K/uL   Neutrophils Relative % 70.9 43.0 - 77.0 %   Lymphocytes Relative 14.6 12.0 - 46.0 %   Monocytes Relative 9.0 3.0 - 12.0 %   Eosinophils Relative 4.5 0.0 - 5.0 %   Basophils Relative 1.0 0.0 - 3.0 %   Neutro Abs 4.8 1.4 - 7.7 K/uL   Lymphs Abs 1.0 0.7 - 4.0 K/uL   Monocytes Absolute 0.6 0.1 - 1.0 K/uL   Eosinophils Absolute 0.3 0.0 - 0.7 K/uL   Basophils Absolute 0.1 0.0 - 0.1 K/uL  Comprehensive metabolic panel     Status: Abnormal   Collection Time: 11/12/22 10:22 AM   Result Value Ref Range   Sodium 141 135 - 145 mEq/L   Potassium 4.8 3.5 - 5.1 mEq/L   Chloride 105 96 - 112 mEq/L   CO2 27 19 - 32  mEq/L   Glucose, Bld 86 70 - 99 mg/dL   BUN 33 (H) 6 - 23 mg/dL   Creatinine, Ser 5.62 (H) 0.40 - 1.50 mg/dL   Total Bilirubin 1.1 0.2 - 1.2 mg/dL   Alkaline Phosphatase 66 39 - 117 U/L   AST 18 0 - 37 U/L   ALT 14 0 - 53 U/L   Total Protein 6.8 6.0 - 8.3 g/dL   Albumin 4.4 3.5 - 5.2 g/dL   GFR 13.08 (L) >65.78 mL/min    Comment: Calculated using the CKD-EPI Creatinine Equation (2021)   Calcium 10.2 8.4 - 10.5 mg/dL  Urinalysis w microscopic + reflex cultur     Status: None   Collection Time: 11/12/22 10:22 AM   Specimen: Blood  Result Value Ref Range   Color, Urine YELLOW YELLOW   APPearance CLEAR CLEAR   Specific Gravity, Urine 1.017 1.001 - 1.035   pH 7.0 5.0 - 8.0   Glucose, UA NEGATIVE NEGATIVE   Bilirubin Urine NEGATIVE NEGATIVE   Ketones, ur NEGATIVE NEGATIVE   Hgb urine dipstick NEGATIVE NEGATIVE   Protein, ur NEGATIVE NEGATIVE   Nitrites, Initial NEGATIVE NEGATIVE   Leukocyte Esterase NEGATIVE NEGATIVE   WBC, UA NONE SEEN 0 - 5 /HPF   RBC / HPF NONE SEEN 0 - 2 /HPF   Squamous Epithelial / HPF NONE SEEN < OR = 5 /HPF   Bacteria, UA NONE SEEN NONE SEEN /HPF   Hyaline Cast NONE SEEN NONE SEEN /LPF   Note      Comment: This urine was analyzed for the presence of WBC,  RBC, bacteria, casts, and other formed elements.  Only those elements seen were reported. . .   PSA     Status: Abnormal   Collection Time: 11/12/22 10:22 AM  Result Value Ref Range   PSA 0.08 (L) 0.10 - 4.00 ng/mL    Comment: Test performed using [employer] PSA Assay, a parmagnetic partical, chemiluminecent immunoassay.  REFLEXIVE URINE CULTURE     Status: None   Collection Time: 11/12/22 10:22 AM  Result Value Ref Range   Reflexve Urine Culture      Comment: NO CULTURE INDICATED  VITAMIN D 25 Hydroxy (Vit-D Deficiency, Fractures)     Status: None    Collection Time: 12/03/22  9:39 AM  Result Value Ref Range   VITD 59.99 30.00 - 100.00 ng/mL  Renal Function Panel     Status: Abnormal   Collection Time: 12/03/22  9:39 AM  Result Value Ref Range   Sodium 139 135 - 145 mEq/L   Potassium 4.5 3.5 - 5.1 mEq/L   Chloride 106 96 - 112 mEq/L   CO2 27 19 - 32 mEq/L   Albumin 4.1 3.5 - 5.2 g/dL   BUN 28 (H) 6 - 23 mg/dL   Creatinine, Ser 4.69 0.40 - 1.50 mg/dL   Glucose, Bld 99 70 - 99 mg/dL   Phosphorus 3.6 2.3 - 4.6 mg/dL   GFR 62.95 (L) >28.41 mL/min    Comment: Calculated using the CKD-EPI Creatinine Equation (2021)   Calcium 9.6 8.4 - 10.5 mg/dL        Garner Nash, MD, MS

## 2022-12-16 ENCOUNTER — Telehealth: Payer: Self-pay

## 2022-12-16 NOTE — Progress Notes (Signed)
Care Guide Note  12/16/2022 Name: David [name] MRN: [id number] DOB: 1933/05/29  Referred by: Garnette Gunner, MD Reason for referral : Care Coordination (Outreach to schedule with pharm d )   David [name] is a 89 y.o. year old male who is a primary care patient of Garnette Gunner, MD. David [name] was referred to the pharmacist for assistance related to HTN.    Successful contact was made with the patient to discuss pharmacy services including being ready for the pharmacist to call at least 5 minutes before the scheduled appointment time, to have medication bottles and any blood sugar or blood pressure readings ready for review. The patient agreed to meet with the pharmacist via with the pharmacist via telephone visit on (date/time).  12/23/2022  Penne Lash, RMA Care Guide [facility]  [place], [place] [address] Direct Dial: [phone] [email or web address]

## 2022-12-17 DIAGNOSIS — E559 Vitamin D deficiency, unspecified: Secondary | ICD-10-CM | POA: Insufficient documentation

## 2022-12-17 DIAGNOSIS — Z85038 Personal history of other malignant neoplasm of large intestine: Secondary | ICD-10-CM

## 2022-12-17 HISTORY — DX: Vitamin D deficiency, unspecified: E55.9

## 2022-12-17 HISTORY — DX: Personal history of other malignant neoplasm of large intestine: Z85.038

## 2022-12-17 NOTE — Assessment & Plan Note (Signed)
Advise consultation with gastroenterology due to increased risk from history of colon cancer and partial resection.

## 2022-12-17 NOTE — Assessment & Plan Note (Signed)
Resistant.  Uncontrolled. Refer to the advanced hypertensive clinic  referral to a clinical pharmacist for interim blood pressure management and monitoring.

## 2022-12-21 ENCOUNTER — Other Ambulatory Visit (INDEPENDENT_AMBULATORY_CARE_PROVIDER_SITE_OTHER): Payer: Medicare Other

## 2022-12-21 ENCOUNTER — Other Ambulatory Visit: Payer: Medicare Other

## 2022-12-21 DIAGNOSIS — E782 Mixed hyperlipidemia: Secondary | ICD-10-CM

## 2022-12-21 LAB — LIPID PANEL
Cholesterol: 165 mg/dL (ref 0–200)
HDL: 57.1 mg/dL (ref >39.00)
LDL Cholesterol: 93 mg/dL (ref 0–99)
NonHDL: 107.49
Total CHOL/HDL Ratio: 3
Triglycerides: 70 mg/dL (ref 0.0–149.0)
VLDL: 14 mg/dL (ref 0.0–40.0)

## 2022-12-22 ENCOUNTER — Telehealth: Payer: Self-pay | Admitting: Family Medicine

## 2022-12-22 NOTE — Telephone Encounter (Signed)
error 

## 2022-12-23 ENCOUNTER — Other Ambulatory Visit: Payer: Medicare Other

## 2022-12-23 NOTE — Progress Notes (Signed)
12/23/2022  Patient ID: David [name], male   DOB: [birth year], 89 y.o.   MRN: [id number]  Patient outreach for scheduled telephone visit regarding control of HTN was unsuccessful.  Called and left HIPAA compliant voicemail x2 with my direct phone number.  If I do not hear back from patient, I will call in the next week to attempt to reschedule.  Lenna Gilford, PharmD, DPLA

## 2022-12-28 ENCOUNTER — Other Ambulatory Visit: Payer: Medicare Other

## 2022-12-28 NOTE — Progress Notes (Unsigned)
   12/28/2022  Patient ID: David [name], male   DOB: [birth year], 89 y.o.   MRN: [id number]  S/O Telephone visit to assist with HTN control until patient can be seen by [facility] in response to referral from PCP, Dr. Janee Morn  HTN -Current medications:amlodipine 10mg  daily (taking at bedtime), hydralazine 10mg  TID if SBD>/=155, losartan-hydrochlorothiazide 100-25mg  daily (taking in the morning), spironolactone 25mg  daily (taking at night) -Patient endorses good adherence to medication regimen -Monitors home BP twice daily, in the morning and the evening prior to taking BP medications -Recent readings include: Morning- 175/59, 144/54, 164/55, 156/54, 139/55, 164/54, 154/57, 161/60, 158/39 Evening- 136/54, 127/51, 158/54, 152/64,166/62, 129/47, 163/57 -Home monitor is automated, upper-arm monitor; and patient states readings are similar to OV readings -Last OV reading on 10/22 was 140/78 on 2nd reading -Patient does not endorse any s/sx of hypotension or hypertension -Patient/wife state he is taking hydralazine 10mg  at least two times every day and sometimes TID due to SBP being 155+  A/P  HTN -Currently uncontrolled -Of note, home DBP values appear low while SBP consistently elevated, reflecting isolated systolic hypertension -Recommend patient begin checking BP 30 minutes-1 hour after taking morning and evening BP medications -Evaluated current medications, and non recognized to increase BP; patient does have 1 cup of coffee on average per day -Recommend avoiding NSAIDS and limiting sodium intake, as these can further increase BP -Patient is scheduled at [facility] 1/16 and sees Dr. Janee Morn again 11/18 -Follow-up with patient in 2 weeks to evaluate BP readings after medication doses, but I still anticipate SBP to remain elevated.  If still elevated, we could consider changing thiazide-like diuretic to a more potent agent, such as chlorthalidone or  indapamide.  Spironolactone or hydralazine dose increases could also be considered.  Follow-up:  2 weeks  Lenna Gilford, PharmD, DPLA

## 2023-01-05 ENCOUNTER — Other Ambulatory Visit: Payer: Medicare Other

## 2023-01-05 NOTE — Progress Notes (Signed)
   01/05/2023  Patient ID: David [name], male   DOB: [birth year], 89 y.o.   MRN: [id number]  S/O Telephone visit to follow-up on control of HTN  Hypertension -current medications:  amlodipine 10mg  daily, hydralazine 10mg  TID if SBP 155+, losartan/hydrochlorothiazide 100/25mg  qam, spironolactone 25mg  qpm -patient is taking home BP BID, approximately 30-60 minutes after morning and evening BP mediation doses -recent home BP readings:  174/58, 139/56, 174/59 -SBP remains elevated while DBP remains at goal  A/P  Hypertension -recommend patient begin taking hydralazine 10mg  TID unless SBP <130 -continue to monitor BP BID and record readings -patient has follow-up with Dr. Janee Morn 11/18 and will bring in BP logs -if BP remains consistently elevated >140/90, I recommend either increasing hydralazine to 25mg  TID or changing losartan/hydrochlorothiazide 100/25 mg to valsartan 160mg  daily and chlorthalidone 25mg  daily  Follow-up:  Lenna Gilford, PharmD, DPLA

## 2023-01-11 ENCOUNTER — Ambulatory Visit (INDEPENDENT_AMBULATORY_CARE_PROVIDER_SITE_OTHER): Payer: Medicare Other | Admitting: Family Medicine

## 2023-01-11 ENCOUNTER — Encounter: Payer: Self-pay | Admitting: Family Medicine

## 2023-01-11 VITALS — BP 132/70 | HR 73 | Temp 97.5°F | Wt 189.6 lb

## 2023-01-11 DIAGNOSIS — I10 Essential (primary) hypertension: Secondary | ICD-10-CM

## 2023-01-11 DIAGNOSIS — Z85038 Personal history of other malignant neoplasm of large intestine: Secondary | ICD-10-CM

## 2023-01-11 DIAGNOSIS — E559 Vitamin D deficiency, unspecified: Secondary | ICD-10-CM

## 2023-01-11 DIAGNOSIS — L814 Other melanin hyperpigmentation: Secondary | ICD-10-CM

## 2023-01-11 DIAGNOSIS — Z8701 Personal history of pneumonia (recurrent): Secondary | ICD-10-CM

## 2023-01-11 DIAGNOSIS — R911 Solitary pulmonary nodule: Secondary | ICD-10-CM

## 2023-01-11 DIAGNOSIS — R448 Other symptoms and signs involving general sensations and perceptions: Secondary | ICD-10-CM

## 2023-01-11 DIAGNOSIS — E782 Mixed hyperlipidemia: Secondary | ICD-10-CM | POA: Diagnosis not present

## 2023-01-11 DIAGNOSIS — N1831 Chronic kidney disease, stage 3a: Secondary | ICD-10-CM

## 2023-01-11 DIAGNOSIS — R413 Other amnesia: Secondary | ICD-10-CM

## 2023-01-11 NOTE — Progress Notes (Unsigned)
Assessment/Plan:   Problem List Items Addressed This Visit   None   There are no discontinued medications.  No follow-ups on file.    Subjective:   Encounter date: 01/11/2023  David [name] is a 89 y.o. male who has Rectus diastasis; Community acquired pneumonia of left lower lobe of lung; Acute hypoxic respiratory failure (HCC); Pleural effusion; Stage 3a chronic kidney disease (CKD) (HCC); HTN (hypertension); Normocytic anemia; Recurrent pneumonia; Empyema (HCC); Protein-calorie malnutrition, severe; Dysphagia; Stenosis of right carotid artery; Proteinuria; Chronic fatigue; Spinal stenosis of lumbar region without neurogenic claudication; Bradycardia; Mixed hyperlipidemia; Benign prostatic hyperplasia with lower urinary tract symptoms; Memory loss due to medical condition; BBB (bundle branch block); Vitamin D deficiency; and History of colon cancer on their problem list..   He  has a past medical history of Hypertension.Marland Kitchen   He presents with chief complaint of Medical Management of Chronic Issues (4 week f/u b/p. Patient is always cold. ) .   HPI:   ROS  Past Surgical History:  Procedure Laterality Date   COLON SURGERY     KNEE SURGERY  2009 or 2010   replaced knee cap   PROSTATE SURGERY  2012    Outpatient Medications Prior to Visit  Medication Sig Dispense Refill   Amino Acids (AMINO ACID PO) Take 1 capsule by mouth daily.     amLODipine (NORVASC) 10 MG tablet Take 10 mg by mouth at bedtime.     Ascorbic Acid (VITAMIN C) 1000 MG tablet Take 1,000 mg by mouth daily.     aspirin EC 81 MG tablet Take 81 mg by mouth in the morning. Swallow whole.     B Complex Vitamins (VITAMIN B-COMPLEX) TABS Take 1 tablet by mouth every morning.     Cholecalciferol (VITAMIN D3) 50 MCG (2000 UT) TABS Take 2,000 Units by mouth daily.     Coenzyme Q10 (CO Q10 PO) Take 30 mLs by mouth daily.     Cyanocobalamin (VITAMIN B-12) 2500 MCG SUBL Place 2,500 mcg under the tongue daily.      ECHINACEA-ZINC-VITAMIN C PO Take 1 tablet by mouth daily.     ferrous sulfate 325 (65 FE) MG tablet Take 325 mg by mouth daily with breakfast.     finasteride (PROSCAR) 5 MG tablet Take 5 mg by mouth daily.     GARLIC PO Take by mouth.     GLUCOSAMINE CHONDROITIN MSM PO Take 30 mLs by mouth daily.     guaiFENesin (MUCINEX) 600 MG 12 hr tablet Take 1 tablet (600 mg total) by mouth 2 (two) times daily. 60 tablet 1   hydrALAZINE (APRESOLINE) 10 MG tablet Take 10 mg by mouth 3 (three) times daily as needed (if Systolic number is 155 or greater).     ipratropium-albuterol (DUONEB) 0.5-2.5 (3) MG/3ML SOLN Take 3 mLs by nebulization every 6 (six) hours as needed. 360 mL    Krill Oil (OMEGA-3) 500 MG CAPS Take 1 capsule by mouth daily.     losartan-hydrochlorothiazide (HYZAAR) 100-25 MG tablet Take 1 tablet by mouth daily.     MAGNESIUM GLYCINATE PO Take 240 mg by mouth daily.     Omega-3 Fatty Acids (FISH OIL OMEGA-3 PO) Take 1 capsule by mouth daily.     omeprazole (PRILOSEC) 20 MG capsule Take 1 capsule (20 mg total) by mouth 2 (two) times daily before a meal. (Patient taking differently: Take 20 mg by mouth daily.) 60 capsule 1   pantoprazole (PROTONIX) 40 MG tablet 1 tablet Orally  Once a day for 30 days     potassium chloride (KLOR-CON) 20 MEQ packet Take by mouth.     Probiotic Product (PROBIOTIC PO) Take 1 capsule by mouth daily.     rosuvastatin (CRESTOR) 20 MG tablet Take 20 mg by mouth daily.     spironolactone (ALDACTONE) 25 MG tablet Take 1 tablet by mouth daily.     SUPER B COMPLEX/C PO Take 1 tablet by mouth daily.     triamcinolone cream (KENALOG) 0.1 % Apply 1 Application topically 2 (two) times daily as needed (to legs/ankles- for itching).     Vitamin D, Ergocalciferol, (DRISDOL) 1.25 MG (50000 UNIT) CAPS capsule Take 1 capsule (50,000 Units total) by mouth every 7 (seven) days. 5 capsule 0   Zinc 50 MG TABS Take 50 mg by mouth daily.     No facility-administered medications prior to  visit.    Family History  Problem Relation Age of Onset   Cancer Mother        breast   Heart disease Father        heart attack at age 78    Social History   Socioeconomic History   Marital status: Married    Spouse name: [name]   Number of children: 3   Years of education: 12   Highest education level: Not on file  Occupational History    Comment: retired  Tobacco Use   Smoking status: Former    Current packs/day: 0.00    Average packs/day: 2.0 packs/day for 20.0 years (40.0 ttl pk-yrs)    Types: Cigarettes    Start date: 02/24/1948    Quit date: 02/24/1968    Years since quitting: 54.9    Passive exposure: Never   Smokeless tobacco: Never  Vaping Use   Vaping status: Never Used  Substance and Sexual Activity   Alcohol use: No   Drug use: No   Sexual activity: Not on file  Other Topics Concern   Not on file  Social History Narrative   Lives at home with spouse   Caffeine use- occass coffee, tea   Social Determinants of Health   Financial Resource Strain: Not on file  Food Insecurity: No Food Insecurity (02/27/2022)   Hunger Vital Sign    Worried About Running Out of Food in the Last Year: Never true    Ran Out of Food in the Last Year: Never true  Transportation Needs: No Transportation Needs (02/27/2022)   PRAPARE - Administrator, Civil Service (Medical): No    Lack of Transportation (Non-Medical): No  Physical Activity: Not on file  Stress: Not on file  Social Connections: Unknown (07/04/2021)   Received from [facility]   Social Network    Social Network: Not on file  Intimate Partner Violence: Not At Risk (02/27/2022)   Humiliation, Afraid, Rape, and Kick questionnaire    Fear of Current or Ex-Partner: No    Emotionally Abused: No    Physically Abused: No    Sexually Abused: No  Objective:  Physical Exam: BP 132/70 (BP  Location: Left Arm, Patient Position: Sitting, Cuff Size: Large)   Pulse 73   Temp (!) 97.5 F (36.4 C) (Temporal)   Wt 189 lb 9.6 oz (86 kg)   SpO2 97%   BMI 26.82 kg/m     Physical Exam  No results found.  Recent Results (from the past 2160 hour(s))  TSH     Status: None   Collection Time: 11/12/22 10:22 AM  Result Value Ref Range   TSH 2.13 0.35 - 5.50 uIU/mL  Lipid panel     Status: None   Collection Time: 11/12/22 10:22 AM  Result Value Ref Range   Cholesterol 126 0 - 200 mg/dL    Comment: ATP III Classification       Desirable:  < 200 mg/dL               Borderline High:  200 - 239 mg/dL          High:  > = 161 mg/dL   Triglycerides 096.0 0.0 - 149.0 mg/dL    Comment: Normal:  <454 mg/dLBorderline High:  150 - 199 mg/dL   HDL 09.81 >19.14 mg/dL   VLDL 78.2 0.0 - 95.6 mg/dL   LDL Cholesterol 48 0 - 99 mg/dL   Total CHOL/HDL Ratio 2     Comment:                Men          Women1/2 Average Risk     3.4          3.3Average Risk          5.0          4.42X Average Risk          9.6          7.13X Average Risk          15.0          11.0                       NonHDL 68.72     Comment: NOTE:  Non-HDL goal should be 30 mg/dL higher than patient's LDL goal (i.e. LDL goal of < 70 mg/dL, would have non-HDL goal of < 100 mg/dL)  Hemoglobin O1H     Status: None   Collection Time: 11/12/22 10:22 AM  Result Value Ref Range   Hgb A1c MFr Bld 5.2 4.6 - 6.5 %    Comment: Glycemic Control Guidelines for People with Diabetes:Non Diabetic:  <6%Goal of Therapy: <7%Additional Action Suggested:  >8%   Microalbumin / creatinine urine ratio     Status: Abnormal   Collection Time: 11/12/22 10:22 AM  Result Value Ref Range   Microalb, Ur 2.1 (H) 0.0 - 1.9 mg/dL   Creatinine,U 08.6 mg/dL   Microalb Creat Ratio 2.2 0.0 - 30.0 mg/g  Vitamin D 1,25 dihydroxy     Status: Abnormal   Collection Time: 11/12/22 10:22 AM  Result Value Ref Range   Vitamin D 1, 25 (OH)2 Total 14 (L) 18 - 72 pg/mL    Vitamin D3 1, 25 (OH)2 14 pg/mL   Vitamin D2 1, 25 (OH)2 <8 pg/mL    Comment: (Note) Vitamin D3, 1,25(OH)2 indicates both endogenous  production and supplementation. Vitamin D2, 1,25(OH)2 is  an indicator of exogenous sources, such as diet or  supplementation. Interpretation and therapy are based on  measurement of Vitamin D, 1,25 (  OH)2, Total. . This test was developed, and its analytical performance  characteristics have been determined by [employer]. It has not been cleared or approved by the  FDA. This assay has been validated pursuant to the CLIA  regulations and is used for clinical purposes. . For additional information, please refer to http://education.[facility].com/faq/FAQ199 (This link is being provided for  informational/educational purposes only.) . [facility] med [facility] [address] [place] [id number] [id number] Junita Push L. Annelie Boak Caul, MD, PhD   CBC with Differential/Platelet     Status: None   Collection Time: 11/12/22 10:22 AM  Result Value Ref Range   WBC 6.8 4.0 - 10.5 K/uL   RBC 4.71 4.22 - 5.81 Mil/uL   Hemoglobin 13.8 13.0 - 17.0 g/dL   HCT 91.4 78.2 - 95.6 %   MCV 88.8 78.0 - 100.0 fl   MCHC 32.9 30.0 - 36.0 g/dL   RDW 21.3 08.6 - 57.8 %   Platelets 217.0 150.0 - 400.0 K/uL   Neutrophils Relative % 70.9 43.0 - 77.0 %   Lymphocytes Relative 14.6 12.0 - 46.0 %   Monocytes Relative 9.0 3.0 - 12.0 %   Eosinophils Relative 4.5 0.0 - 5.0 %   Basophils Relative 1.0 0.0 - 3.0 %   Neutro Abs 4.8 1.4 - 7.7 K/uL   Lymphs Abs 1.0 0.7 - 4.0 K/uL   Monocytes Absolute 0.6 0.1 - 1.0 K/uL   Eosinophils Absolute 0.3 0.0 - 0.7 K/uL   Basophils Absolute 0.1 0.0 - 0.1 K/uL  Comprehensive metabolic panel     Status: Abnormal   Collection Time: 11/12/22 10:22 AM  Result Value Ref Range   Sodium 141 135 - 145 mEq/L   Potassium 4.8 3.5 - 5.1 mEq/L   Chloride 105 96 - 112 mEq/L   CO2 27 19 - 32 mEq/L   Glucose, Bld 86 70 - 99  mg/dL   BUN 33 (H) 6 - 23 mg/dL   Creatinine, Ser 4.69 (H) 0.40 - 1.50 mg/dL   Total Bilirubin 1.1 0.2 - 1.2 mg/dL   Alkaline Phosphatase 66 39 - 117 U/L   AST 18 0 - 37 U/L   ALT 14 0 - 53 U/L   Total Protein 6.8 6.0 - 8.3 g/dL   Albumin 4.4 3.5 - 5.2 g/dL   GFR 62.95 (L) >28.41 mL/min    Comment: Calculated using the CKD-EPI Creatinine Equation (2021)   Calcium 10.2 8.4 - 10.5 mg/dL  Urinalysis w microscopic + reflex cultur     Status: None   Collection Time: 11/12/22 10:22 AM   Specimen: Blood  Result Value Ref Range   Color, Urine YELLOW YELLOW   APPearance CLEAR CLEAR   Specific Gravity, Urine 1.017 1.001 - 1.035   pH 7.0 5.0 - 8.0   Glucose, UA NEGATIVE NEGATIVE   Bilirubin Urine NEGATIVE NEGATIVE   Ketones, ur NEGATIVE NEGATIVE   Hgb urine dipstick NEGATIVE NEGATIVE   Protein, ur NEGATIVE NEGATIVE   Nitrites, Initial NEGATIVE NEGATIVE   Leukocyte Esterase NEGATIVE NEGATIVE   WBC, UA NONE SEEN 0 - 5 /HPF   RBC / HPF NONE SEEN 0 - 2 /HPF   Squamous Epithelial / HPF NONE SEEN < OR = 5 /HPF   Bacteria, UA NONE SEEN NONE SEEN /HPF   Hyaline Cast NONE SEEN NONE SEEN /LPF   Note      Comment: This urine was analyzed for the presence of WBC,  RBC, bacteria, casts, and other formed  elements.  Only those elements seen were reported. . .   PSA     Status: Abnormal   Collection Time: 11/12/22 10:22 AM  Result Value Ref Range   PSA 0.08 (L) 0.10 - 4.00 ng/mL    Comment: Test performed using [employer] PSA Assay, a parmagnetic partical, chemiluminecent immunoassay.  REFLEXIVE URINE CULTURE     Status: None   Collection Time: 11/12/22 10:22 AM  Result Value Ref Range   Reflexve Urine Culture      Comment: NO CULTURE INDICATED  VITAMIN D 25 Hydroxy (Vit-D Deficiency, Fractures)     Status: None   Collection Time: 12/03/22  9:39 AM  Result Value Ref Range   VITD 59.99 30.00 - 100.00 ng/mL  Renal Function Panel     Status: Abnormal   Collection Time: 12/03/22  9:39 AM   Result Value Ref Range   Sodium 139 135 - 145 mEq/L   Potassium 4.5 3.5 - 5.1 mEq/L   Chloride 106 96 - 112 mEq/L   CO2 27 19 - 32 mEq/L   Albumin 4.1 3.5 - 5.2 g/dL   BUN 28 (H) 6 - 23 mg/dL   Creatinine, Ser 0.10 0.40 - 1.50 mg/dL   Glucose, Bld 99 70 - 99 mg/dL   Phosphorus 3.6 2.3 - 4.6 mg/dL   GFR 27.25 (L) >36.64 mL/min    Comment: Calculated using the CKD-EPI Creatinine Equation (2021)   Calcium 9.6 8.4 - 10.5 mg/dL  Lipid panel     Status: None   Collection Time: 12/21/22  8:42 AM  Result Value Ref Range   Cholesterol 165 0 - 200 mg/dL    Comment: ATP III Classification       Desirable:  < 200 mg/dL               Borderline High:  200 - 239 mg/dL          High:  > = 403 mg/dL   Triglycerides 47.4 0.0 - 149.0 mg/dL    Comment: Normal:  <259 mg/dLBorderline High:  150 - 199 mg/dL   HDL 56.38 >75.64 mg/dL   VLDL 33.2 0.0 - 95.1 mg/dL   LDL Cholesterol 93 0 - 99 mg/dL   Total CHOL/HDL Ratio 3     Comment:                Men          Women1/2 Average Risk     3.4          3.3Average Risk          5.0          4.42X Average Risk          9.6          7.13X Average Risk          15.0          11.0                       NonHDL 107.49     Comment: NOTE:  Non-HDL goal should be 30 mg/dL higher than patient's LDL goal (i.e. LDL goal of < 70 mg/dL, would have non-HDL goal of < 100 mg/dL)        Garner Nash, MD, MS

## 2023-01-11 NOTE — Patient Instructions (Signed)
For blood pressure, there has been significant improvement.  Continue current medications.  Continue to track your blood pressure twice a day.  Follow-up with the blood pressure clinic as scheduled.  For cholesterol, continue to work on healthy lifestyle including diet and exercise.     We will recheck your lab work including cholesterol in 6 months.  Please obtain that lab fasting and follow-up for an appointment 1 week after that to discuss blood pressure and cholesterol management.

## 2023-01-12 ENCOUNTER — Ambulatory Visit: Payer: Medicare Other | Admitting: Family Medicine

## 2023-01-14 DIAGNOSIS — Z8701 Personal history of pneumonia (recurrent): Secondary | ICD-10-CM | POA: Insufficient documentation

## 2023-01-14 DIAGNOSIS — R448 Other symptoms and signs involving general sensations and perceptions: Secondary | ICD-10-CM

## 2023-01-14 DIAGNOSIS — R911 Solitary pulmonary nodule: Secondary | ICD-10-CM

## 2023-01-14 DIAGNOSIS — L814 Other melanin hyperpigmentation: Secondary | ICD-10-CM

## 2023-01-14 HISTORY — DX: Other melanin hyperpigmentation: L81.4

## 2023-01-14 HISTORY — DX: Personal history of pneumonia (recurrent): Z87.01

## 2023-01-14 HISTORY — DX: Other symptoms and signs involving general sensations and perceptions: R44.8

## 2023-01-14 HISTORY — DX: Solitary pulmonary nodule: R91.1

## 2023-01-14 NOTE — Assessment & Plan Note (Signed)
S/p partial colectomy  Advise consultation with gastroenterology due to increased risk from history of colon cancer and partial resection.  Already established with GI. Monitor for signs of colon cancer reoccurance.

## 2023-01-14 NOTE — Assessment & Plan Note (Signed)
History of VitD def. Recheck levels.

## 2023-01-14 NOTE — Assessment & Plan Note (Addendum)
Improved control with current regimen;   Continue current antihypertensive regimen. Continue monitoring blood pressure at home.

## 2023-01-14 NOTE — Assessment & Plan Note (Signed)
Patient reports persistent sensation of feeling cold; likely age-related; thyroid function tests normal; no other hypothyroid symptoms.

## 2023-01-14 NOTE — Assessment & Plan Note (Signed)
Spouse reports concerns about patient's memory worsening; possible early cognitive decline.  Offered referral to neurology for further evaluation of memory concerns, but patient declines. Spouse currently undergoing memory evaluation and wish to defer further assessment until after her neurologic evaluation.  Monitor cognitive function during follow-up visits. Consider repeat MMSE vs. MiniCog.

## 2023-01-14 NOTE — Assessment & Plan Note (Signed)
Requires ongoing monitoring with chest CT.

## 2023-01-14 NOTE — Assessment & Plan Note (Addendum)
Patient off rosuvastatin; cholesterol levels remain stable; will continue monitoring

## 2023-01-14 NOTE — Assessment & Plan Note (Signed)
Patient concerned about recurrence; reassured about infection prevention measures.

## 2023-01-14 NOTE — Assessment & Plan Note (Signed)
Benign skin changes consistent with age; patient reassured.

## 2023-01-27 DIAGNOSIS — M533 Sacrococcygeal disorders, not elsewhere classified: Secondary | ICD-10-CM | POA: Diagnosis not present

## 2023-02-02 DIAGNOSIS — D1801 Hemangioma of skin and subcutaneous tissue: Secondary | ICD-10-CM | POA: Diagnosis not present

## 2023-02-02 DIAGNOSIS — Z129 Encounter for screening for malignant neoplasm, site unspecified: Secondary | ICD-10-CM | POA: Diagnosis not present

## 2023-02-02 DIAGNOSIS — L821 Other seborrheic keratosis: Secondary | ICD-10-CM | POA: Diagnosis not present

## 2023-02-02 DIAGNOSIS — D692 Other nonthrombocytopenic purpura: Secondary | ICD-10-CM | POA: Diagnosis not present

## 2023-02-02 DIAGNOSIS — L570 Actinic keratosis: Secondary | ICD-10-CM | POA: Diagnosis not present

## 2023-02-02 DIAGNOSIS — Z85820 Personal history of malignant melanoma of skin: Secondary | ICD-10-CM | POA: Diagnosis not present

## 2023-02-02 DIAGNOSIS — D239 Other benign neoplasm of skin, unspecified: Secondary | ICD-10-CM | POA: Diagnosis not present

## 2023-02-08 ENCOUNTER — Other Ambulatory Visit: Payer: Self-pay

## 2023-02-08 NOTE — Progress Notes (Signed)
   02/08/2023  Patient ID: David [name], male   DOB: September 08, 1933, 89 y.o.   MRN: [id number]  S/O Telephone visit to follow-up on control of hypertension  Hypertension -Current medications:  spironolactone 25mg  daily, losartan/hydrochlorothiazide 100-25mg  daily, amlodipine 10mg  daily, hydralazine 10mg  TID -Patient checks home BP in the morning and the evening; morning readings range 148-167/54-63 and evening readings 129-154/50-52 -Last OV with Dr. Janee Morn on 11/18, BP was 132/70 -Patient does not endorse any s/sx of hypertension, but he does endorse some infrequent dizziness/lightheadedness when making postural changes (orthostatic hypotension)  A/P  Hypertension -Patient sees advanced htn clinic 03/10/22 -Hesitant to make any medication changes at this time based on diastolic BP values and some occurrences of orthostatic hypotension -Continue current regiment until seen by advanced hypertension clinic next month  Follow-up:  6 weeks  Lenna Gilford, PharmD, DPLA

## 2023-03-09 ENCOUNTER — Other Ambulatory Visit: Payer: Self-pay | Admitting: Family Medicine

## 2023-03-09 ENCOUNTER — Telehealth: Payer: Self-pay

## 2023-03-09 DIAGNOSIS — K219 Gastro-esophageal reflux disease without esophagitis: Secondary | ICD-10-CM

## 2023-03-09 MED ORDER — PANTOPRAZOLE SODIUM 40 MG PO TBEC: 40.0000 mg | DELAYED_RELEASE_TABLET | Freq: Every day | ORAL | 1 refills | Status: DC

## 2023-03-09 NOTE — Telephone Encounter (Signed)
 Patient is aware of annotation below and verbalized understanding.

## 2023-03-09 NOTE — Telephone Encounter (Signed)
 Received refill request for pantoprazole 40mg  via fax from [facility] in [place]. Provider is showing historical. Please advise.

## 2023-03-11 ENCOUNTER — Encounter (HOSPITAL_BASED_OUTPATIENT_CLINIC_OR_DEPARTMENT_OTHER): Payer: Self-pay | Admitting: Family

## 2023-03-11 ENCOUNTER — Ambulatory Visit (HOSPITAL_BASED_OUTPATIENT_CLINIC_OR_DEPARTMENT_OTHER): Payer: Self-pay | Admitting: Family

## 2023-03-11 VITALS — BP 151/56 | HR 48 | Ht 71.0 in | Wt 193.0 lb

## 2023-03-11 DIAGNOSIS — R55 Syncope and collapse: Secondary | ICD-10-CM | POA: Diagnosis not present

## 2023-03-11 DIAGNOSIS — I6521 Occlusion and stenosis of right carotid artery: Secondary | ICD-10-CM

## 2023-03-11 DIAGNOSIS — I10 Essential (primary) hypertension: Secondary | ICD-10-CM

## 2023-03-11 DIAGNOSIS — I454 Nonspecific intraventricular block: Secondary | ICD-10-CM | POA: Diagnosis not present

## 2023-03-11 MED ORDER — OLMESARTAN MEDOXOMIL-HCTZ 40-25 MG PO TABS: 1.0000 | ORAL_TABLET | Freq: Every day | ORAL | 1 refills | Status: DC

## 2023-03-11 NOTE — Patient Instructions (Signed)
Medication Instructions:  Your physician recommends that you continue on your current medications as directed. Please refer to the Current Medication list given to you today.  Check medication box for losartan-hydrochlorothiazide to ensure you are taking. Someone will call you around 4pm today to check!     Testing/Procedures: Your physician has requested that you have a carotid duplex. This test is an ultrasound of the carotid arteries in your neck. It looks at blood flow through these arteries that supply the brain with blood. Allow one hour for this exam. There are no restrictions or special instructions.  Your physician has requested that you have a renal artery duplex. During this test, an ultrasound is used to evaluate blood flow to the kidneys. Allow one hour for this exam. Do not eat after midnight the day before and avoid carbonated beverages. Take your medications as you usually do.    Follow-Up: Follow up in 2-3 months in hypertension clinic

## 2023-03-11 NOTE — Progress Notes (Signed)
Advanced [facility]pertension [facility] Initial Assessment:    Date:  03/14/2023   ID:  David [name], DOB 06/03/1933, MRN [id number]  PCP:  Garnette Gunner, MD  Cardiologist:  None  Nephrologist:  Referring MD: Garnette Gunner, MD   CC: Hypertension  History of Present Illness:    David [name] is a 89 y.o. male with a hx of coronary artery calcification on CT scan, aortic atherosclerosis. HTN, CKD3a, HLD, vitamin D deficiency, colon cancer, spinal stenosis here to establish care in the [facility].   Prior CT abd 2009 with normal adrenals. CT chest 02/26/22 with coronary artery calcification and aortic atherosclerosis. PCP team has previously increased Amlodipine to 10mg , increased Hydralazine to 10mg  TID.  David [name] was diagnosed with hypertension >10 years ago.  he reports tobacco use previously having quit in 1970. For exercise he goes to [facility] three times per week - walksa  mile on the treadmill and does other strength strengthing. he eats at home and does not follow low sodium diet. Wife notes he does salt his food. Reports no shortness of breath nor dyspnea on exertion. Reports no chest pain, pressure, or tightness. No orthopnea, PND. Reports rare lower extremity edema. Reports no palpitations.  Has some orthostatic lightheadedness three times per week. Drinks coffee in the morning and then water. He had one syncopal episode 18 months ago but has not recurred. BP this month 121/56-166/55 by home arm cuff.   Previous antihypertensives:   Past Medical History:  Diagnosis Date   Acute hypoxic respiratory failure (HCC) 02/26/2022   BBB (bundle branch block) 11/12/2022   Benign prostatic hyperplasia with lower urinary tract symptoms 11/12/2022   Bradycardia 11/12/2022   Chronic fatigue 11/12/2022   Community acquired pneumonia of left lower lobe of lung 02/26/2022   Dysphagia 05/08/2022   Empyema (HCC) 02/28/2022   Feels cold 01/14/2023   History of colon cancer  12/17/2022   History of pneumonia 01/14/2023   HTN (hypertension) 02/26/2022   Hypertension    Memory loss 11/12/2022   Mixed hyperlipidemia 11/12/2022   Normocytic anemia 02/26/2022   Pleural effusion 02/26/2022   Protein-calorie malnutrition, severe 03/02/2022   Proteinuria 11/12/2022   Pulmonary nodule 01/14/2023   Rectus diastasis 01/04/2012   Recurrent pneumonia 02/27/2022   Solar lentigo 01/14/2023   Spinal stenosis of lumbar region without neurogenic claudication 11/12/2022   Stage 3a chronic kidney disease (CKD) (HCC) 02/26/2022   Stenosis of right carotid artery 11/12/2022   Vitamin D deficiency 12/17/2022    Past Surgical History:  Procedure Laterality Date   COLON SURGERY     KNEE SURGERY  2009 or 2010   replaced knee cap   PROSTATE SURGERY  2012    Current Medications: Current Meds  Medication Sig   amLODipine (NORVASC) 10 MG tablet Take 10 mg by mouth at bedtime.   aspirin EC 81 MG tablet Take 81 mg by mouth in the morning. Swallow whole.   B Complex Vitamins (VITAMIN B-COMPLEX) TABS Take 1 tablet by mouth every morning.   Cholecalciferol (VITAMIN D3) 50 MCG (2000 UT) TABS Take 2,000 Units by mouth daily.   Coenzyme Q10 (CO Q10 PO) Take 30 mLs by mouth daily.   Cyanocobalamin (VITAMIN B-12) 2500 MCG SUBL Place 2,500 mcg under the tongue daily.   finasteride (PROSCAR) 5 MG tablet Take 5 mg by mouth daily.   GARLIC PO Take by mouth.   hydrALAZINE (APRESOLINE) 10 MG tablet Take 10 mg by mouth 3 (  three) times daily.   Krill Oil (OMEGA-3) 500 MG CAPS Take 1 capsule by mouth daily.   MAGNESIUM GLYCINATE PO Take 240 mg by mouth daily.   olmesartan-hydrochlorothiazide (BENICAR HCT) 40-25 MG tablet Take 1 tablet by mouth daily.   pantoprazole (PROTONIX) 40 MG tablet Take 1 tablet (40 mg total) by mouth daily.   Probiotic Product (PROBIOTIC PO) Take 1 capsule by mouth daily.   spironolactone (ALDACTONE) 25 MG tablet Take 1 tablet by mouth daily.   Zinc 50 MG TABS Take  50 mg by mouth daily.   [DISCONTINUED] losartan-hydrochlorothiazide (HYZAAR) 100-25 MG tablet Take 1 tablet by mouth daily.     Allergies:   Patient has no known allergies.   Social History   Socioeconomic History   Marital status: Married    Spouse name: [name]   Number of children: 3   Years of education: 12   Highest education level: Not on file  Occupational History    Comment: retired  Tobacco Use   Smoking status: Former    Current packs/day: 0.00    Average packs/day: 2.0 packs/day for 20.0 years (40.0 ttl pk-yrs)    Types: Cigarettes    Start date: 02/24/1948    Quit date: 02/24/1968    Years since quitting: 55.0    Passive exposure: Never   Smokeless tobacco: Never  Vaping Use   Vaping status: Never Used  Substance and Sexual Activity   Alcohol use: No   Drug use: No   Sexual activity: Not on file  Other Topics Concern   Not on file  Social History Narrative   Lives at home with spouse   Caffeine use- occass coffee, tea   Social Drivers of Corporate investment banker Strain: Not on file  Food Insecurity: No Food Insecurity (02/27/2022)   Hunger Vital Sign    Worried About Running Out of Food in the Last Year: Never true    Ran Out of Food in the Last Year: Never true  Transportation Needs: No Transportation Needs (02/27/2022)   PRAPARE - Administrator, Civil Service (Medical): No    Lack of Transportation (Non-Medical): No  Physical Activity: Not on file  Stress: Not on file  Social Connections: Unknown (07/04/2021)   Received [facility]   Social Network    Social Network: Not on file     Family History: The patient's family history includes Cancer in his mother; Heart disease in his father.  ROS:   Please see the history of present illness.     All other systems reviewed and are negative.  EKGs/Labs/Other Studies Reviewed:    EKG Interpretation Date/Time:  Thursday March 11 2023 10:28:12 EST Ventricular Rate:   48 PR Interval:  226 QRS Duration:  146 QT Interval:  488 QTC Calculation: 435 R Axis:   -66  Text Interpretation: Sinus bradycardia with 1st degree A-V block Right bundle branch block Left anterior fascicular block Bifascicular block Stable compared to previous Confirmed by Gillian Shields ([id number]) on 03/11/2023 10:40:36 AM    Recent Labs: 11/12/2022: ALT 14; Hemoglobin 13.8; Platelets 217.0; TSH 2.13 12/03/2022: BUN 28; Creatinine, Ser 1.44; Potassium 4.5; Sodium 139   Recent Lipid Panel    Component Value Date/Time   CHOL 165 12/21/2022 0842   TRIG 70.0 12/21/2022 0842   HDL 57.10 12/21/2022 0842   CHOLHDL 3 12/21/2022 0842   VLDL 14.0 12/21/2022 0842   LDLCALC 93 12/21/2022 0842    Physical Exam:  VS:  BP (!) 151/56   Pulse (!) 48   Ht 5\' 11"  (1.803 m)   Wt 193 lb (87.5 kg)   SpO2 98%   BMI 26.92 kg/m  , BMI Body mass index is 26.92 kg/m. GENERAL:  Well appearing HEENT: Pupils equal round and reactive, fundi not visualized, oral mucosa unremarkable NECK:  No jugular venous distention, waveform within normal limits, carotid upstroke brisk and symmetric, no bruits, no thyromegaly LYMPHATICS:  No cervical adenopathy LUNGS:  Clear to auscultation bilaterally HEART:  RRR.  PMI not displaced or sustained,S1 and S2 within normal limits, no S3, no S4, no clicks, no rubs, no murmurs ABD:  Flat, positive bowel sounds normal in frequency in pitch, no bruits, no rebound, no guarding, no midline pulsatile mass, no hepatomegaly, no splenomegaly EXT:  2 plus pulses throughout, no edema, no cyanosis no clubbing SKIN:  No rashes no nodules NEURO:  Cranial nerves II through XII grossly intact, motor grossly intact throughout PSYCH:  Cognitively intact, oriented to person place and time   ASSESSMENT/PLAN:    HTN - BP not at goal <130/80. Stop Losartan-hydrochlorothiazide and start Olmesartan-hydrochlorothiazide 40-25mg  daily (confirmed via phone call after visit that he was taking  Losartan-hydrochlorothiazide as there was some confusion in clinic visit.  Continue amlodipine 10 mg daily, hydralazine 10 mg 3 times daily, spironolactone 25 mg daily.  Hopefully BP will be better controlled with olmesartan dose HCTZ and will be able to discontinue hydralazine for simplification of regimen. Plan for renal artery duplex.  Plan for carotid duplex.  Bradycardia / RBBB / LAFB - Asymptomatic with no lightheadedness, dizziness. Reports longstanding history of bradycardia. Avoid AV nodal blocking agents.  Coronary calcification on CT / HLD, LDL goal <70 - Stable with no anginal symptoms. No indication for ischemic evaluation.  GDMT includes Aspirin. No longer on Rosuvastatin, discuss possible 3x per week dosing of low dose Rosuvastatin at follow up.   Screening for Secondary Hypertension:     Relevant Labs/Studies:    Latest Ref Rng & Units 12/03/2022    9:39 AM 11/12/2022   10:22 AM 03/10/2022    3:54 AM  Basic Labs  Sodium 135 - 145 mEq/L 139  141    Potassium 3.5 - 5.1 mEq/L 4.5  4.8    Creatinine 0.40 - 1.50 mg/dL 1.61  0.96  0.45        Latest Ref Rng & Units 11/12/2022   10:22 AM 03/05/2022   10:25 AM  Thyroid   TSH 0.35 - 5.50 uIU/mL 2.13  3.006                 03/11/2023   11:13 AM  Renovascular   Renal Artery [place] Completed Yes       Disposition:    FU with MD/APP/PharmD in 2-3 months    Medication Adjustments/Labs and Tests Ordered: Current medicines are reviewed at length with the patient today.  Concerns regarding medicines are outlined above.  Orders Placed This Encounter  Procedures   EKG 12-Lead   VAS US RENAL ARTERY DUPLEX   VAS US CAROTID   Meds ordered this encounter  Medications   olmesartan-hydrochlorothiazide (BENICAR HCT) 40-25 MG tablet    Sig: Take 1 tablet by mouth daily.    Dispense:  90 tablet    Refill:  1    Discontinue losartan -hctz    Supervising Provider:   Jodelle Red [[id number]]     Signed, Alver Sorrow,  NP  03/14/2023 12:55 PM  [facility]

## 2023-03-14 ENCOUNTER — Encounter (HOSPITAL_BASED_OUTPATIENT_CLINIC_OR_DEPARTMENT_OTHER): Payer: Self-pay | Admitting: Family

## 2023-03-24 ENCOUNTER — Telehealth: Payer: Self-pay | Admitting: Family Medicine

## 2023-03-24 NOTE — Telephone Encounter (Signed)
Patient dropped off document Handicap Placard, to be filled out by provider. Patient requested to send it back via Call Patient to pick up within 7-days. Document is located in providers tray at front office.Please advise at Mobile [phone] (mobile) or [phone]

## 2023-03-25 ENCOUNTER — Other Ambulatory Visit: Payer: Self-pay

## 2023-03-25 NOTE — Progress Notes (Signed)
   03/25/2023  Patient ID: [name], male   DOB: 1934/02/12, 89 y.o.   MRN: [id number]  Subjective/Objective Telephone visit to follow-up on control of hypertension  Hypertension -Current medications: Amlodipine 10 mg at bedtime, hydralazine 10 mg 3 times daily, olmesartan/hydrochlorothiazide 40/25 mg in the morning, spironolactone 25 mg tablet at bedtime -Patient was seen by the hypertension clinic approximately 2 weeks ago, and losartan hydrochlorothiazide was changed to olmesartan/hydrochlorothiazide 40/25 mg tablet daily -Patient does monitor home blood pressure.  Morning readings range 123-164/52-67; evening readings range 123-172/51-67 -Patient does not endorse any signs or symptoms of hypo or hypertension  Assessment/Plan  Hypertension -Continue current regimen and lower monitoring/recording of home blood pressure -Patient sees advanced hypertensive clinic again 3/17, and I instructed him to take blood pressure log into this visit -Deferring management to advanced hypertension clinic, but recommended that patient reach out to me if any medication questions or needs arise  Lenna Gilford, PharmD, DPLA

## 2023-03-25 NOTE — Telephone Encounter (Signed)
  CLINICAL USE BELOW THIS LINE (use X to signify action taken)  ___ Form received and placed in providers office for signature. ___ Form completed and faxed to LOA Dept.  __x_ Form completed &Advised patient's spouse to notify patient ready for pick up.  __x_ Charge sheet and copy of form in front office folder for office supervisor.

## 2023-04-06 ENCOUNTER — Encounter (HOSPITAL_BASED_OUTPATIENT_CLINIC_OR_DEPARTMENT_OTHER): Payer: Self-pay

## 2023-04-13 ENCOUNTER — Telehealth (HOSPITAL_BASED_OUTPATIENT_CLINIC_OR_DEPARTMENT_OTHER): Payer: Self-pay | Admitting: Family

## 2023-04-13 NOTE — Telephone Encounter (Signed)
 Follow Up:    Wife took his temperature before we got off the phone, it was 99.4

## 2023-04-13 NOTE — Telephone Encounter (Signed)
 New Messagea:     Wife called and said patient hs a head cold with no fever. He is scheduled for 2 tests at [facility] tomorrow. She wants to know if he needs to reschedule, because of his cold?

## 2023-04-13 NOTE — Telephone Encounter (Signed)
 Called and spoke to spouse. Advised to take at home test if patient is symptomatic. Carotid and renal ultrasound rescheduled.

## 2023-04-13 NOTE — Telephone Encounter (Signed)
 If symptomatic would recommend rescheduling imaging given prevalence of RSV, flu right now. Would encourage him to test at home if possible.   Alver Sorrow, NP

## 2023-04-14 ENCOUNTER — Encounter (HOSPITAL_BASED_OUTPATIENT_CLINIC_OR_DEPARTMENT_OTHER): Payer: Self-pay

## 2023-04-14 ENCOUNTER — Ambulatory Visit: Payer: Self-pay | Admitting: Family Medicine

## 2023-04-14 NOTE — Telephone Encounter (Signed)
 Summary: Med for Col/Congestion   Copied From CRM [id number]. Reason for Triage: Patient spouse called requesting medication for patient due to head cold and congestion - says he had pneumonia last year         Chief Complaint: cough, headache, runny nose, wheezing, congestion Symptoms: see above Frequency: started 4 days ago Pertinent Negatives: Patient denies fever, numbness/weakness, n/v/d Disposition: [] ED /[] Urgent Care (no appt availability in office) / [] Appointment(In office/virtual)/ []  [facility]/ [] Home Care/ [] Refused Recommended Disposition /[]  Mobile Bus/ [x]  Follow-up with PCP Additional Notes: protocol suggest visit today; patient declined and states he doesn't feel like going anywhere.  Wants pcp to call him something in.  Instructed to go to the ER if becomes worse.  Office updated.   Reason for Disposition  [1] Sinus congestion (pressure, fullness) AND [2] present > 10 days  Answer Assessment - Initial Assessment Questions 1. ONSET: "When did the nasal discharge start?"      Runny nose 2. AMOUNT: "How much discharge is there?"      Running all the time, yellowish 3. COUGH: "Do you have a cough?" If Yes, ask: "Describe the color of your sputum" (clear, white, yellow, green)     Started 4 days ago 4. RESPIRATORY DISTRESS: "Describe your breathing."      Difficult, wheezing at times 5. FEVER: "Do you have a fever?" If Yes, ask: "What is your temperature, how was it measured, and when did it start?"     denies 6. SEVERITY: "Overall, how bad are you feeling right now?" (e.g., doesn't interfere with normal activities, staying home from school/work, staying in bed)      bad 7. OTHER SYMPTOMS: "Do you have any other symptoms?" (e.g., sore throat, earache, wheezing, vomiting)     Wheezing, sore throat, headache 8. PREGNANCY: "Is there any chance you are pregnant?" "When was your last menstrual period?"     na  Protocols used: Common Cold-A-AH

## 2023-04-14 NOTE — Telephone Encounter (Signed)
 Noted. Dm/cma

## 2023-04-15 ENCOUNTER — Encounter: Payer: Self-pay | Admitting: Nurse Practitioner

## 2023-04-15 ENCOUNTER — Ambulatory Visit (INDEPENDENT_AMBULATORY_CARE_PROVIDER_SITE_OTHER): Payer: HMO | Admitting: Nurse Practitioner

## 2023-04-15 VITALS — BP 122/44 | HR 51 | Temp 98.0°F | Ht 71.0 in | Wt 195.4 lb

## 2023-04-15 DIAGNOSIS — R0982 Postnasal drip: Secondary | ICD-10-CM

## 2023-04-15 DIAGNOSIS — J0140 Acute pansinusitis, unspecified: Secondary | ICD-10-CM

## 2023-04-15 MED ORDER — FLUTICASONE PROPIONATE 50 MCG/ACT NA SUSP: 2.0000 | Freq: Every day | NASAL | 0 refills | Status: DC

## 2023-04-15 MED ORDER — BENZONATATE 100 MG PO CAPS: 100.0000 mg | ORAL_CAPSULE | Freq: Three times a day (TID) | ORAL | 0 refills | Status: DC | PRN

## 2023-04-15 MED ORDER — AZITHROMYCIN 250 MG PO TABS: 250.0000 mg | ORAL_TABLET | Freq: Every day | ORAL | 0 refills | Status: DC

## 2023-04-15 NOTE — Patient Instructions (Addendum)
 Start flonase and benzonatate as prescribed. Start azithromycin if no improvement by Monday 04/19/2023 Call office if symptoms do not resolve after completion of azithromycin

## 2023-04-15 NOTE — Progress Notes (Signed)
 Acute Office Visit  Subjective:    Patient ID: David [name], male    DOB: June 17, 1933, 89 y.o.   MRN: [id number]  Chief Complaint  Patient presents with   Cough    With head and chest congestion, runny nose for 1 week   Cough This is a new problem. The current episode started in the past 7 days. The problem has been unchanged. The problem occurs constantly. The cough is Productive of sputum. Associated symptoms include nasal congestion, postnasal drip and rhinorrhea. Pertinent negatives include no chest pain, chills, ear congestion, ear pain, fever, headaches, heartburn, hemoptysis, myalgias, rash, sore throat, shortness of breath, sweats, weight loss or wheezing. The symptoms are aggravated by lying down and cold air. He has tried nothing for the symptoms.  No tobacco use.  Outpatient Medications Prior to Visit  Medication Sig   amLODipine (NORVASC) 10 MG tablet Take 10 mg by mouth at bedtime.   aspirin EC 81 MG tablet Take 81 mg by mouth in the morning. Swallow whole.   B Complex Vitamins (VITAMIN B-COMPLEX) TABS Take 1 tablet by mouth every morning.   Cholecalciferol (VITAMIN D3) 50 MCG (2000 UT) TABS Take 2,000 Units by mouth daily.   Coenzyme Q10 (CO Q10 PO) Take 30 mLs by mouth daily.   Cyanocobalamin (VITAMIN B-12) 2500 MCG SUBL Place 2,500 mcg under the tongue daily.   finasteride (PROSCAR) 5 MG tablet Take 5 mg by mouth daily.   GARLIC PO Take by mouth.   hydrALAZINE (APRESOLINE) 10 MG tablet Take 10 mg by mouth 3 (three) times daily.   Krill Oil (OMEGA-3) 500 MG CAPS Take 1 capsule by mouth daily.   MAGNESIUM GLYCINATE PO Take 240 mg by mouth daily.   olmesartan-hydrochlorothiazide (BENICAR HCT) 40-25 MG tablet Take 1 tablet by mouth daily.   pantoprazole (PROTONIX) 40 MG tablet Take 1 tablet (40 mg total) by mouth daily.   Probiotic Product (PROBIOTIC PO) Take 1 capsule by mouth daily.   spironolactone (ALDACTONE) 25 MG tablet Take 1 tablet by mouth daily.   Zinc 50 MG  TABS Take 50 mg by mouth daily.   No facility-administered medications prior to visit.    Reviewed past medical and social history.  Review of Systems  Constitutional:  Negative for chills, fever and weight loss.  HENT:  Positive for postnasal drip and rhinorrhea. Negative for ear pain and sore throat.   Respiratory:  Positive for cough. Negative for hemoptysis, shortness of breath and wheezing.   Cardiovascular:  Negative for chest pain.  Gastrointestinal:  Negative for heartburn.  Musculoskeletal:  Negative for myalgias.  Skin:  Negative for rash.  Neurological:  Negative for headaches.   Per HPI     Objective:    Physical Exam Vitals and nursing note reviewed.  Constitutional:      General: He is not in acute distress. HENT:     Nose: Mucosal edema, congestion and rhinorrhea present.     Right Nostril: No occlusion.     Left Nostril: No occlusion.     Right Turbinates: Swollen.     Left Turbinates: Swollen.     Right Sinus: Maxillary sinus tenderness and frontal sinus tenderness present.     Left Sinus: Maxillary sinus tenderness and frontal sinus tenderness present.     Mouth/Throat:     Pharynx: Uvula midline. Posterior oropharyngeal erythema and postnasal drip present.  Cardiovascular:     Rate and Rhythm: Normal rate and regular rhythm.  Pulses: Normal pulses.     Heart sounds: Normal heart sounds.  Pulmonary:     Effort: Pulmonary effort is normal.     Breath sounds: Normal breath sounds.  Neurological:     Mental Status: He is alert and oriented to person, place, and time.    BP (!) 122/44 (BP Location: Left Arm, Patient Position: Sitting, Cuff Size: Normal)   Pulse (!) 51   Temp 98 F (36.7 C)   Ht 5\' 11"  (1.803 m)   Wt 195 lb 6.4 oz (88.6 kg)   SpO2 98%   BMI 27.25 kg/m    No results found for any visits on 04/15/23.     Assessment & Plan:   Problem List Items Addressed This Visit   None Visit Diagnoses       PND (post-nasal drip)    -   Primary   Relevant Medications   fluticasone (FLONASE) 50 MCG/ACT nasal spray   benzonatate (TESSALON) 100 MG capsule     Acute non-recurrent pansinusitis       Relevant Medications   fluticasone (FLONASE) 50 MCG/ACT nasal spray   benzonatate (TESSALON) 100 MG capsule   azithromycin (ZITHROMAX Z-PAK) 250 MG tablet      Meds ordered this encounter  Medications   fluticasone (FLONASE) 50 MCG/ACT nasal spray    Sig: Place 2 sprays into both nostrils daily.    Dispense:  16 g    Refill:  0    Supervising Provider:   Nadene Rubins ALFRED [[id number]]   benzonatate (TESSALON) 100 MG capsule    Sig: Take 1-2 capsules (100-200 mg total) by mouth 3 (three) times daily as needed.    Dispense:  21 capsule    Refill:  0    Supervising Provider:   Nadene Rubins ALFRED [[id number]]   azithromycin (ZITHROMAX Z-PAK) 250 MG tablet    Sig: Take 1 tablet (250 mg total) by mouth daily. Take 2tabs on first day, then 1tab once a day till complete. Start if no improvement by 04/19/23    Dispense:  6 tablet    Refill:  0    Supervising Provider:   Nadene Rubins ALFRED [[id number]]   Start flonase and benzonatate as prescribed. Start azithromycin if no improvement by Monday 04/19/2023 Call office if symptoms do not resolve after completion of azithromycin  Return if symptoms worsen or fail to improve.  Alysia Penna, NP

## 2023-04-19 ENCOUNTER — Ambulatory Visit: Payer: Self-pay | Admitting: Family Medicine

## 2023-04-19 ENCOUNTER — Telehealth (INDEPENDENT_AMBULATORY_CARE_PROVIDER_SITE_OTHER): Payer: HMO | Admitting: Nurse Practitioner

## 2023-04-19 ENCOUNTER — Encounter: Payer: Self-pay | Admitting: Nurse Practitioner

## 2023-04-19 VITALS — Ht 71.0 in | Wt 180.0 lb

## 2023-04-19 DIAGNOSIS — J22 Unspecified acute lower respiratory infection: Secondary | ICD-10-CM

## 2023-04-19 MED ORDER — AMOXICILLIN-POT CLAVULANATE 875-125 MG PO TABS: 1.0000 | ORAL_TABLET | Freq: Two times a day (BID) | ORAL | 0 refills | Status: DC

## 2023-04-19 MED ORDER — BENZONATATE 100 MG PO CAPS: 100.0000 mg | ORAL_CAPSULE | Freq: Three times a day (TID) | ORAL | 0 refills | Status: DC | PRN

## 2023-04-19 NOTE — Telephone Encounter (Signed)
 I called and spoke with patient after checking with Dr. Janee Morn and he has no available appointments for today. I asked patient if I could schedule him with Lauren for this morning and he agreed. Appointment made and link sent to patient.

## 2023-04-19 NOTE — Progress Notes (Signed)
 [facility] [address] [place] [address] Dept: [phone] Dept Fax: [phone]  Telephone Visit  I connected with [name] on 04/19/23 at 10:20 AM EST by telephone and verified that I am speaking with the correct person using two identifiers.  Location patient: Home Location provider: Clinic Persons participating in the virtual visit: Patient; Rodman Pickle, NP; Malena Peer, CMA  I discussed the limitations of evaluation and management by telemedicine and the availability of in person appointments. The patient expressed understanding and agreed to proceed.  Interactive audio and video telecommunications were attempted between this provider and patient, however failed due to patient having technical difficulties and not getting access to video capability. We continued and completed this visit with audio only.   Chief Complaint  Patient presents with   Cough    Head congestion for 1 week, was seen in office last week    SUBJECTIVE:  HPI: WOODFIN KISS is a 89 y.o. male who presents with head congestion and cough for the last 10 days. He was seen on 04/15/23 and prescribed a zpak, flonase, and tessalon.   UPPER RESPIRATORY TRACT INFECTION  Fever: no Cough: yes - mucus brown Shortness of breath: no Wheezing: no Chest pain: no Chest tightness: no Chest congestion: a little Nasal congestion: yes Runny nose: no Post nasal drip: yes Sneezing: no Sore throat: no Swollen glands: no Sinus pressure: no Headache: no Face pain: no Toothache: no Ear pain: no bilateral Ear pressure: no bilateral Eyes red/itching:no Eye drainage/crusting: no  Vomiting: no Rash: no Fatigue: yes Sick contacts: wife - sick with similar symptoms  Strep contacts: no  Context: worse Recurrent sinusitis: no Relief with OTC cold/cough medications: at first, symptoms now back  Treatments attempted: zpak, tessalon,  flonase   Past Medical History:  Diagnosis Date   Acute hypoxic respiratory failure (HCC) 02/26/2022   BBB (bundle branch block) 11/12/2022   Benign prostatic hyperplasia with lower urinary tract symptoms 11/12/2022   Bradycardia 11/12/2022   Chronic fatigue 11/12/2022   Community acquired pneumonia of left lower lobe of lung 02/26/2022   Dysphagia 05/08/2022   Empyema (HCC) 02/28/2022   Feels cold 01/14/2023   History of colon cancer 12/17/2022   History of pneumonia 01/14/2023   HTN (hypertension) 02/26/2022   Hypertension    Memory loss 11/12/2022   Mixed hyperlipidemia 11/12/2022   Normocytic anemia 02/26/2022   Pleural effusion 02/26/2022   Protein-calorie malnutrition, severe 03/02/2022   Proteinuria 11/12/2022   Pulmonary nodule 01/14/2023   Rectus diastasis 01/04/2012   Recurrent pneumonia 02/27/2022   Solar lentigo 01/14/2023   Spinal stenosis of lumbar region without neurogenic claudication 11/12/2022   Stage 3a chronic kidney disease (CKD) (HCC) 02/26/2022   Stenosis of right carotid artery 11/12/2022   Vitamin D deficiency 12/17/2022    Past Surgical History:  Procedure Laterality Date   COLON SURGERY     KNEE SURGERY  2009 or 2010   replaced knee cap   PROSTATE SURGERY  2012    Family History  Problem Relation Age of Onset   Cancer Mother        breast   Heart disease Father        heart attack at age 41    Social History   Tobacco Use   Smoking status: Former    Current packs/day: 0.00    Average packs/day: 2.0 packs/day for 20.0 years (40.0 ttl pk-yrs)    Types: Cigarettes  Start date: 02/24/1948    Quit date: 02/24/1968    Years since quitting: 55.1    Passive exposure: Never   Smokeless tobacco: Never  Vaping Use   Vaping status: Never Used  Substance Use Topics   Alcohol use: No   Drug use: No     Current Outpatient Medications:    amLODipine (NORVASC) 10 MG tablet, Take 10 mg by mouth at bedtime., Disp: , Rfl:     amoxicillin-clavulanate (AUGMENTIN) 875-125 MG tablet, Take 1 tablet by mouth 2 (two) times daily., Disp: 20 tablet, Rfl: 0   aspirin EC 81 MG tablet, Take 81 mg by mouth in the morning. Swallow whole., Disp: , Rfl:    B Complex Vitamins (VITAMIN B-COMPLEX) TABS, Take 1 tablet by mouth every morning., Disp: , Rfl:    Cholecalciferol (VITAMIN D3) 50 MCG (2000 UT) TABS, Take 2,000 Units by mouth daily., Disp: , Rfl:    Coenzyme Q10 (CO Q10 PO), Take 30 mLs by mouth daily., Disp: , Rfl:    Cyanocobalamin (VITAMIN B-12) 2500 MCG SUBL, Place 2,500 mcg under the tongue daily., Disp: , Rfl:    finasteride (PROSCAR) 5 MG tablet, Take 5 mg by mouth daily., Disp: , Rfl:    fluticasone (FLONASE) 50 MCG/ACT nasal spray, Place 2 sprays into both nostrils daily., Disp: 16 g, Rfl: 0   GARLIC PO, Take by mouth., Disp: , Rfl:    hydrALAZINE (APRESOLINE) 10 MG tablet, Take 10 mg by mouth 3 (three) times daily., Disp: , Rfl:    Krill Oil (OMEGA-3) 500 MG CAPS, Take 1 capsule by mouth daily., Disp: , Rfl:    MAGNESIUM GLYCINATE PO, Take 240 mg by mouth daily., Disp: , Rfl:    olmesartan-hydrochlorothiazide (BENICAR HCT) 40-25 MG tablet, Take 1 tablet by mouth daily., Disp: 90 tablet, Rfl: 1   pantoprazole (PROTONIX) 40 MG tablet, Take 1 tablet (40 mg total) by mouth daily., Disp: 90 tablet, Rfl: 1   Probiotic Product (PROBIOTIC PO), Take 1 capsule by mouth daily., Disp: , Rfl:    spironolactone (ALDACTONE) 25 MG tablet, Take 1 tablet by mouth daily., Disp: , Rfl:    Zinc 50 MG TABS, Take 50 mg by mouth daily., Disp: , Rfl:    benzonatate (TESSALON) 100 MG capsule, Take 1-2 capsules (100-200 mg total) by mouth 3 (three) times daily as needed., Disp: 30 capsule, Rfl: 0  No Known Allergies  ROS: See pertinent positives and negatives per HPI.  OBSERVATIONS/OBJECTIVE:  VITALS per patient if applicable: Vitals:   04/19/23 1010  Weight: 180 lb (81.6 kg)  Height: 5\' 11"  (1.803 m)     ASSESSMENT AND  PLAN:  Problem List Items Addressed This Visit   None Visit Diagnoses       Lower respiratory infection    -  Primary   Symptoms x10 days, not improved with zpak. Start augmnetin BID x10 days, encourage fluids. Start mucinex BID. Refill tessalon TID prn cough.   Relevant Medications   benzonatate (TESSALON) 100 MG capsule       I discussed the assessment and treatment plan with the patient. The patient was provided an opportunity to ask questions and all were answered. The patient agreed with the plan and demonstrated an understanding of the instructions.   The patient was advised to call back or seek an in-person evaluation if the symptoms worsen or if the condition fails to improve as anticipated.  I spent 20 minutes on this telephone encounter.  Saraiya Kozma A Ansley Mangiapane,  NP

## 2023-04-19 NOTE — Telephone Encounter (Signed)
 Copied from CRM [id number]. Topic: Clinical - Red Word Triage >> Apr 19, 2023  8:32 AM Almira Coaster wrote: Red Word that prompted transfer to Nurse Triage: Patient was seen on 04/15/2023 due to cough, congestion, covid like symptoms. Patient states he is still experiencing symptoms.  Chief Complaint: Wants to be seen in office today at around 2pm.  States he was told to call back if he was not feeling better.  States seen last week Symptoms: cough, congestion Frequency: constant Pertinent Negatives: Patient denies cp, sob Disposition: [] ED /[] Urgent Care (no appt availability in office) / [x] Appointment(In office/virtual)/ []  [facility]/ [] Home Care/ [] Refused Recommended Disposition /[] [facility]/ []  Follow-up with PCP Additional Notes: office to call patient back, would like to be seen with wife today at 2 pm.  No new symptoms, states just not feeling better.  PCP office updated.   Reason for Disposition  Nursing judgment or information in reference  Answer Assessment - Initial Assessment Questions 1. REASON FOR CALL: "What is your main concern right now?"     Cough, congestion, was told to call back and would like to be seen today when wife is seen at 2 pm today.  Protocols used: No Guideline Available-A-[facility]

## 2023-04-19 NOTE — Patient Instructions (Signed)
 It was great to see you!  Start augmentin twice a day for 10 days  Start mucinex over the counter twice a day to help with congestion  I have refilled your tessalon 3 times a day as needed for cough  Drink plenty of fluids.   Let's follow-up if your symptoms don't improve or worsen  Take care,  Rodman Pickle, NP

## 2023-05-04 ENCOUNTER — Encounter (HOSPITAL_BASED_OUTPATIENT_CLINIC_OR_DEPARTMENT_OTHER): Payer: HMO

## 2023-05-10 ENCOUNTER — Encounter (HOSPITAL_BASED_OUTPATIENT_CLINIC_OR_DEPARTMENT_OTHER): Payer: Self-pay | Admitting: Cardiovascular Disease

## 2023-05-28 ENCOUNTER — Encounter (HOSPITAL_BASED_OUTPATIENT_CLINIC_OR_DEPARTMENT_OTHER): Payer: Self-pay | Admitting: Cardiovascular Disease

## 2023-06-03 ENCOUNTER — Encounter (HOSPITAL_BASED_OUTPATIENT_CLINIC_OR_DEPARTMENT_OTHER)

## 2023-06-14 ENCOUNTER — Ambulatory Visit (INDEPENDENT_AMBULATORY_CARE_PROVIDER_SITE_OTHER): Payer: HMO

## 2023-06-14 VITALS — BP 102/60 | HR 43 | Temp 97.7°F | Ht 70.5 in | Wt 191.8 lb

## 2023-06-14 DIAGNOSIS — Z0000 Encounter for general adult medical examination without abnormal findings: Secondary | ICD-10-CM | POA: Diagnosis not present

## 2023-06-14 NOTE — Progress Notes (Signed)
 Subjective:   David [name] is a 89 y.o. who presents for a Medicare Wellness preventive visit.  Visit Complete: In person    Persons Participating in Visit: Patient assisted by spouse.  AWV Questionnaire: No: Patient Medicare AWV questionnaire was not completed prior to this visit.  Cardiac Risk Factors include: advanced age (>62men, >43 women);hypertension;male gender     Objective:    Today's Vitals   06/14/23 1610  BP: 102/60  Pulse: (!) 43  Temp: 97.7 F (36.5 C)  TempSrc: Oral  SpO2: 93%  Weight: 191 lb 12.8 oz (87 kg)  Height: 5' 10.5" (1.791 m)   Body mass index is 27.13 kg/m.     06/14/2023    4:26 PM 02/27/2022    4:14 PM 02/26/2022   12:58 PM 09/09/2017    2:21 PM 11/21/2016    7:31 AM 11/20/2016   12:06 PM 11/17/2016    3:13 PM  Advanced Directives  Does Patient Have a Medical Advance Directive? No Yes Yes No No No No  Type of Advance Directive  Living will Living will      Does patient want to make changes to medical advance directive?  No - Patient declined       Would patient like information on creating a medical advance directive? No - Patient declined    Yes (ED - Information included in AVS)      Current Medications (verified) Outpatient Encounter Medications as of 06/14/2023  Medication Sig   Amino Acids (AMINO ACID PO) Take 5 tablets by mouth daily.   amLODipine  (NORVASC ) 10 MG tablet Take 10 mg by mouth at bedtime.   Ascorbic Acid (VITAMIN C) 1000 MG tablet Take 1,000 mg by mouth daily.   aspirin  EC 81 MG tablet Take 81 mg by mouth in the morning. Swallow whole.   B Complex Vitamins (VITAMIN B-COMPLEX) TABS Take 1 tablet by mouth every morning.   Cholecalciferol (VITAMIN D3) 50 MCG (2000 UT) TABS Take 2,000 Units by mouth daily.   Coenzyme Q10 (CO Q10 PO) Take 30 mLs by mouth daily.   Cyanocobalamin (VITAMIN B-12) 2500 MCG SUBL Place 2,500 mcg under the tongue daily.   finasteride (PROSCAR) 5 MG tablet Take 5 mg by mouth daily.   fluticasone   (FLONASE ) 50 MCG/ACT nasal spray Place 2 sprays into both nostrils daily.   GARLIC PO Take by mouth.   hydrALAZINE  (APRESOLINE ) 10 MG tablet Take 10 mg by mouth 3 (three) times daily.   MAGNESIUM GLYCINATE PO Take 240 mg by mouth daily.   olmesartan -hydrochlorothiazide (BENICAR  HCT) 40-25 MG tablet Take 1 tablet by mouth daily.   Omega-3 Fatty Acids (FISH OIL PO) Take 1 capsule by mouth daily.   OVER THE COUNTER MEDICATION Joint liquid 2,00 glucosamine, 200 chondrotin   pantoprazole  (PROTONIX ) 40 MG tablet Take 1 tablet (40 mg total) by mouth daily.   Probiotic Product (PROBIOTIC PO) Take 1 capsule by mouth daily.   spironolactone (ALDACTONE) 25 MG tablet Take 1 tablet by mouth daily.   TURMERIC CURCUMIN PO Take 1 tablet by mouth daily.   Zinc  50 MG TABS Take 50 mg by mouth daily.   amoxicillin -clavulanate (AUGMENTIN ) 875-125 MG tablet Take 1 tablet by mouth 2 (two) times daily. (Patient not taking: Reported on 06/14/2023)   benzonatate  (TESSALON ) 100 MG capsule Take 1-2 capsules (100-200 mg total) by mouth 3 (three) times daily as needed. (Patient not taking: Reported on 06/14/2023)   Krill Oil (OMEGA-3) 500 MG CAPS Take 1 capsule  by mouth daily. (Patient not taking: Reported on 06/14/2023)   No facility-administered encounter medications on file as of 06/14/2023.    Allergies (verified) Patient has no known allergies.   History: Past Medical History:  Diagnosis Date   Acute hypoxic respiratory failure (HCC) 02/26/2022   BBB (bundle branch block) 11/12/2022   Benign prostatic hyperplasia with lower urinary tract symptoms 11/12/2022   Bradycardia 11/12/2022   Chronic fatigue 11/12/2022   Community acquired pneumonia of left lower lobe of lung 02/26/2022   Dysphagia 05/08/2022   Empyema (HCC) 02/28/2022   Feels cold 01/14/2023   History of colon cancer 12/17/2022   History of pneumonia 01/14/2023   HTN (hypertension) 02/26/2022   Hypertension    Memory loss 11/12/2022   Mixed  hyperlipidemia 11/12/2022   Normocytic anemia 02/26/2022   Pleural effusion 02/26/2022   Protein-calorie malnutrition, severe 03/02/2022   Proteinuria 11/12/2022   Pulmonary nodule 01/14/2023   Rectus diastasis 01/04/2012   Recurrent pneumonia 02/27/2022   Solar lentigo 01/14/2023   Spinal stenosis of lumbar region without neurogenic claudication 11/12/2022   Stage 3a chronic kidney disease (CKD) (HCC) 02/26/2022   Stenosis of right carotid artery 11/12/2022   Vitamin D  deficiency 12/17/2022   Past Surgical History:  Procedure Laterality Date   COLON SURGERY     KNEE SURGERY  2009 or 2010   replaced knee cap   PROSTATE SURGERY  2012   Family History  Problem Relation Age of Onset   Cancer Mother        breast   Heart disease Father        heart attack at age 65   Social History   Socioeconomic History   Marital status: Married    Spouse name: Orelia Binet   Number of children: 3   Years of education: 12   Highest education level: Not on file  Occupational History    Comment: retired  Tobacco Use   Smoking status: Former    Current packs/day: 0.00    Average packs/day: 2.0 packs/day for 20.0 years (40.0 ttl pk-yrs)    Types: Cigarettes    Start date: 02/24/1948    Quit date: 02/24/1968    Years since quitting: 55.3    Passive exposure: Never   Smokeless tobacco: Never  Vaping Use   Vaping status: Never Used  Substance and Sexual Activity   Alcohol use: No   Drug use: No   Sexual activity: Not on file  Other Topics Concern   Not on file  Social History Narrative   Lives at home with spouse   Caffeine use- occass coffee, tea   Social Drivers of Corporate investment banker Strain: Low Risk  (06/14/2023)   Overall Financial Resource Strain (CARDIA)    Difficulty of Paying Living Expenses: Not hard at all  Food Insecurity: No Food Insecurity (06/14/2023)   Hunger Vital Sign    Worried About Running Out of Food in the Last Year: Never true    Ran Out of Food in the  Last Year: Never true  Transportation Needs: No Transportation Needs (06/14/2023)   PRAPARE - Administrator, Civil Service (Medical): No    Lack of Transportation (Non-Medical): No  Physical Activity: Sufficiently Active (06/14/2023)   Exercise Vital Sign    Days of Exercise per Week: 3 days    Minutes of Exercise per Session: 60 min  Stress: No Stress Concern Present (06/14/2023)   [employer] of Occupational Health - Occupational Stress Questionnaire  Feeling of Stress : Not at all  Social Connections: Moderately Integrated (06/14/2023)   Social Connection and Isolation Panel [NHANES]    Frequency of Communication with Friends and Family: More than three times a week    Frequency of Social Gatherings with Friends and Family: Once a week    Attends Religious Services: More than 4 times per year    Active Member of [employer] or Organizations: No    Attends Engineer, structural: Never    Marital Status: Married    Tobacco Counseling Counseling given: Not Answered    Clinical Intake:  Pre-visit preparation completed: Yes  Pain : No/denies pain     Nutritional Status: BMI 25 -29 Overweight Nutritional Risks: None Diabetes: No  Lab Results  Component Value Date   HGBA1C 5.2 11/12/2022   HGBA1C 5.5 05/14/2015     How often do you need to have someone help you when you read instructions, pamphlets, or other written materials from your doctor or pharmacy?: 1 - Never  Interpreter Needed?: No  Information entered by :: NAllen LPN   Activities of Daily Living     06/14/2023    4:14 PM  In your present state of health, do you have any difficulty performing the following activities:  Hearing? 0  Vision? 0  Difficulty concentrating or making decisions? 1  Walking or climbing stairs? 1  Comment drop foot  Dressing or bathing? 0  Doing errands, shopping? 0  Preparing Food and eating ? N  Using the Toilet? N  In the past six months, have you  accidently leaked urine? N  Do you have problems with loss of bowel control? N  Managing your Medications? N  Managing your Finances? N  Housekeeping or managing your Housekeeping? N    Patient Care Team: Catheryn Cluck, MD as PCP - General (Family Medicine) Ronni Colace, MD as Consulting Physician (Ophthalmology)  Indicate any recent Medical Services you may have received from other than [facility] providers in the past year (date may be approximate).     Assessment:   This is a routine wellness examination for Lad[place].  Hearing/Vision screen Hearing Screening - Comments:: Denies hearing issues Vision Screening - Comments:: Regular eye exams, Dr. Erin Havers   Goals Addressed             This Visit's Progress    Patient Stated       06/14/2023, denies goals       Depression Screen     06/14/2023    4:28 PM 11/12/2022    9:24 AM  PHQ 2/9 Scores  PHQ - 2 Score 0 3  PHQ- 9 Score 3 6    Fall Risk     06/14/2023    4:27 PM 04/15/2023    1:18 PM 11/12/2022    9:24 AM  Fall Risk   Falls in the past year? 1 1 1   Number falls in past yr: 1 1 1   Injury with Fall? 0 0 1  Risk for fall due to : Impaired balance/gait;Impaired mobility;History of fall(s);Medication side effect History of fall(s)   Follow up Falls prevention discussed;Falls evaluation completed Falls evaluation completed     MEDICARE RISK AT HOME:  Medicare Risk at Home Any stairs in or around the home?: Yes If so, are there any without handrails?: No Home free of loose throw rugs in walkways, pet beds, electrical cords, etc?: Yes Adequate lighting in your home to reduce risk of falls?: Yes Life alert?:  No Use of a cane, walker or w/c?: Yes Grab bars in the bathroom?: No Shower chair or bench in shower?: No Elevated toilet seat or a handicapped toilet?: Yes  TIMED UP AND GO:  Was the test performed?  Yes  Length of time to ambulate 10 feet: 6 sec Gait slow and steady without use of assistive  device  Cognitive Function: 6CIT completed        06/14/2023    4:29 PM  6CIT Screen  What Year? 0 points  What month? 0 points  What time? 0 points  Count back from 20 0 points  Months in reverse 2 points  Repeat phrase 6 points  Total Score 8 points    Immunizations Immunization History  Administered Date(s) Administered   Hep A / Hep B 02/20/2014, 03/22/2014   Hep A, Unspecified 05/28/2006, 02/20/2014, 03/22/2014   Hep B, Unspecified 02/20/2014, 03/22/2014   Hepatitis A, Ped/Adol-2 Dose 05/28/2006, 02/20/2014, 03/22/2014   Hepatitis B, PED/ADOLESCENT 02/20/2014, 03/22/2014   Influenza-Unspecified 12/21/2003, 01/02/2005, 01/07/2007, 12/10/2016   Janssen (J&J) SARS-COV-2 Vaccination 08/01/2019   Moderna Sars-Covid-2 Vaccination 05/09/2019, 06/07/2019   PNEUMOCOCCAL CONJUGATE-20 04/06/2022   Pneumococcal Conjugate-13 01/09/2014   Pneumococcal Polysaccharide-23 05/07/2015   Td (Adult),5 Lf Tetanus Toxid, Preservative Free 10/21/2004   Tdap 05/28/2006, 02/20/2014   Typhoid Live 02/20/2014   Yellow Fever 03/22/2014   Zoster Recombinant(Shingrix) 10/09/2016, 12/10/2016    Screening Tests Health Maintenance  Topic Date Due   COVID-19 Vaccine (3 - Moderna risk series) 08/29/2019   INFLUENZA VACCINE  09/24/2023   DTaP/Tdap/Td (3 - Td or Tdap) 02/21/2024   Medicare Annual Wellness (AWV)  06/13/2024   Pneumonia Vaccine 69+ Years old  Completed   Zoster Vaccines- Shingrix  Completed   HPV VACCINES  Aged Out   Meningococcal B Vaccine  Aged Out    Health Maintenance  Health Maintenance Due  Topic Date Due   COVID-19 Vaccine (3 - Moderna risk series) 08/29/2019   Health Maintenance Items Addressed: Declines covid vaccine.  Additional Screening:  Vision Screening: Recommended annual ophthalmology exams for early detection of glaucoma and other disorders of the eye.  Dental Screening: Recommended annual dental exams for proper oral hygiene  Community Resource  Referral / Chronic Care Management: CRR required this visit?  No   CCM required this visit?  No     Plan:     I have personally reviewed and noted the following in the patient's chart:   Medical and social history Use of alcohol, tobacco or illicit drugs  Current medications and supplements including opioid prescriptions. Patient is not currently taking opioid prescriptions. Functional ability and status Nutritional status Physical activity Advanced directives List of other physicians Hospitalizations, surgeries, and ER visits in previous 12 months Vitals Screenings to include cognitive, depression, and falls Referrals and appointments  In addition, I have reviewed and discussed with patient certain preventive protocols, quality metrics, and best practice recommendations. A written personalized care plan for preventive services as well as general preventive health recommendations were provided to patient.     Areatha Beecham, LPN   1/61/0960   After Visit Summary: (In Person-Printed) AVS printed and given to the patient  Notes: Nothing significant to report at this time.

## 2023-06-14 NOTE — Patient Instructions (Signed)
 Mr. Warman , Thank you for taking time to come for your Medicare Wellness Visit. I appreciate your ongoing commitment to your health goals. Please review the following plan we discussed and let me know if I can assist you in the future.   Referrals/Orders/Follow-Ups/Clinician Recommendations: none  This is a list of the screening recommended for you and due dates:  Health Maintenance  Topic Date Due   COVID-19 Vaccine (3 - Moderna risk series) 08/29/2019   Flu Shot  09/24/2023   DTaP/Tdap/Td vaccine (3 - Td or Tdap) 02/21/2024   Medicare Annual Wellness Visit  06/13/2024   Pneumonia Vaccine  Completed   Zoster (Shingles) Vaccine  Completed   HPV Vaccine  Aged Out   Meningitis B Vaccine  Aged Out    Advanced directives: (Declined) Advance directive discussed with you today. Even though you declined this today, please call our office should you change your mind, and we can give you the proper paperwork for you to fill out.  Next Medicare Annual Wellness Visit scheduled for next year: Yes  insert Preventive Care attachment Insert FALL PREVENTION attachment if needed

## 2023-06-24 ENCOUNTER — Other Ambulatory Visit: Payer: Self-pay

## 2023-06-24 MED ORDER — SPIRONOLACTONE 25 MG PO TABS: 25.0000 mg | ORAL_TABLET | Freq: Every day | ORAL | 0 refills | Status: DC

## 2023-06-24 MED ORDER — AMLODIPINE BESYLATE 10 MG PO TABS: 10.0000 mg | ORAL_TABLET | Freq: Every day | ORAL | 0 refills | Status: DC

## 2023-07-01 ENCOUNTER — Encounter (HOSPITAL_BASED_OUTPATIENT_CLINIC_OR_DEPARTMENT_OTHER): Payer: Self-pay | Admitting: Family

## 2023-07-01 ENCOUNTER — Other Ambulatory Visit: Payer: Self-pay

## 2023-07-06 ENCOUNTER — Other Ambulatory Visit (INDEPENDENT_AMBULATORY_CARE_PROVIDER_SITE_OTHER): Payer: Medicare Other

## 2023-07-06 ENCOUNTER — Ambulatory Visit: Payer: Self-pay | Admitting: Family Medicine

## 2023-07-06 DIAGNOSIS — I10 Essential (primary) hypertension: Secondary | ICD-10-CM | POA: Diagnosis not present

## 2023-07-06 DIAGNOSIS — E782 Mixed hyperlipidemia: Secondary | ICD-10-CM | POA: Diagnosis not present

## 2023-07-06 LAB — LIPID PANEL
Cholesterol: 185 mg/dL (ref 0–200)
HDL: 64 mg/dL (ref >39.00)
LDL Cholesterol: 100 mg/dL — ABNORMAL HIGH (ref 0–99)
NonHDL: 120.6
Total CHOL/HDL Ratio: 3
Triglycerides: 105 mg/dL (ref 0.0–149.0)
VLDL: 21 mg/dL (ref 0.0–40.0)

## 2023-07-06 LAB — BASIC METABOLIC PANEL WITH GFR
BUN: 33 mg/dL — ABNORMAL HIGH (ref 6–23)
CO2: 25 meq/L (ref 19–32)
Calcium: 9.7 mg/dL (ref 8.4–10.5)
Chloride: 106 meq/L (ref 96–112)
Creatinine, Ser: 1.68 mg/dL — ABNORMAL HIGH (ref 0.40–1.50)
GFR: 36.13 mL/min — ABNORMAL LOW (ref >60.00)
Glucose, Bld: 96 mg/dL (ref 70–99)
Potassium: 4.6 meq/L (ref 3.5–5.1)
Sodium: 138 meq/L (ref 135–145)

## 2023-07-06 LAB — HEPATIC FUNCTION PANEL
ALT: 13 U/L (ref 0–53)
AST: 17 U/L (ref 0–37)
Albumin: 4.4 g/dL (ref 3.5–5.2)
Alkaline Phosphatase: 63 U/L (ref 39–117)
Bilirubin, Direct: 0.1 mg/dL (ref 0.0–0.3)
Total Bilirubin: 0.6 mg/dL (ref 0.2–1.2)
Total Protein: 6.6 g/dL (ref 6.0–8.3)

## 2023-07-13 ENCOUNTER — Ambulatory Visit (INDEPENDENT_AMBULATORY_CARE_PROVIDER_SITE_OTHER): Payer: Medicare Other | Admitting: Family Medicine

## 2023-07-13 ENCOUNTER — Encounter: Payer: Self-pay | Admitting: Family Medicine

## 2023-07-13 VITALS — BP 118/40 | HR 51 | Temp 98.1°F | Resp 18 | Wt 192.0 lb

## 2023-07-13 DIAGNOSIS — N1832 Chronic kidney disease, stage 3b: Secondary | ICD-10-CM | POA: Insufficient documentation

## 2023-07-13 DIAGNOSIS — I1A0 Resistant hypertension: Secondary | ICD-10-CM

## 2023-07-13 MED ORDER — HYDRALAZINE HCL 10 MG PO TABS: 5.0000 mg | ORAL_TABLET | Freq: Three times a day (TID) | ORAL | 0 refills | Status: DC | PRN

## 2023-07-13 NOTE — Progress Notes (Signed)
 Assessment & Plan   Assessment/Plan:   Assessment & Plan Hypertension Blood pressure is low today at 118/40, causing sluggishness. Home readings show systolic around 160 and diastolic in the 50s or 60s. Current regimen includes hydralazine , amlodipine , olmesartan  hydrochlorothiazide, and spironolactone . Hydralazine  may contribute to low diastolic pressure. Labs indicate well-managed kidney function, cholesterol, liver function, and metabolic panel. The goal is to balance blood pressure management to avoid hypotension and its risks, such as falls or inadequate perfusion. - Reduce hydralazine  5 mg TID as needed if blood pressure exceeds 140/90. - Continue amlodipine  10 mg daily. - Continue olmesartan  hydrochlorothiazide 40/25 mg daily. - Continue spironolactone  25 mg daily. - Check blood pressure twice daily, in the morning an hour after medication and in the evening before bed. - Coordinate with pharmacy team and hypertension clinic for further management. - Schedule follow-up in two weeks.  Chronic Kidney Disease, unspecified stage Kidney function is well-managed with GFR between upper 30s and low 40s. No significant changes in recent labs. - Follow up with nephrologist when possible.      Medications Discontinued During This Encounter  Medication Reason   Krill Oil (OMEGA-3) 500 MG CAPS    fluticasone  (FLONASE ) 50 MCG/ACT nasal spray    amoxicillin -clavulanate (AUGMENTIN ) 875-125 MG tablet    benzonatate  (TESSALON ) 100 MG capsule    hydrALAZINE  (APRESOLINE ) 10 MG tablet Reorder    Return in about 2 weeks (around 07/27/2023) for BP.        Subjective:   Encounter date: 07/13/2023  David [name] is a 89 y.o. male who has Rectus diastasis; Community acquired pneumonia of left lower lobe of lung; Acute hypoxic respiratory failure (HCC); Pleural effusion; Stage 3a chronic kidney disease (CKD) (HCC); HTN (hypertension); Normocytic anemia; Recurrent pneumonia; Empyema (HCC);  Protein-calorie malnutrition, severe; Dysphagia; Stenosis of right carotid artery; Proteinuria; Chronic fatigue; Spinal stenosis of lumbar region without neurogenic claudication; Bradycardia; Mixed hyperlipidemia; Benign prostatic hyperplasia with lower urinary tract symptoms; Memory loss; BBB (bundle branch block); Vitamin D  deficiency; History of colon cancer; Solar lentigo; Feels cold; History of pneumonia; Pulmonary nodule; and Stage 3b chronic kidney disease (HCC) on their problem list..   He  has a past medical history of Acute hypoxic respiratory failure (HCC) (02/26/2022), BBB (bundle branch block) (11/12/2022), Benign prostatic hyperplasia with lower urinary tract symptoms (11/12/2022), Bradycardia (11/12/2022), Chronic fatigue (11/12/2022), Community acquired pneumonia of left lower lobe of lung (02/26/2022), Dysphagia (05/08/2022), Empyema (HCC) (02/28/2022), Feels cold (01/14/2023), History of colon cancer (12/17/2022), History of pneumonia (01/14/2023), HTN (hypertension) (02/26/2022), Hypertension, Memory loss (11/12/2022), Mixed hyperlipidemia (11/12/2022), Normocytic anemia (02/26/2022), Pleural effusion (02/26/2022), Protein-calorie malnutrition, severe (03/02/2022), Proteinuria (11/12/2022), Pulmonary nodule (01/14/2023), Rectus diastasis (01/04/2012), Recurrent pneumonia (02/27/2022), Solar lentigo (01/14/2023), Spinal stenosis of lumbar region without neurogenic claudication (11/12/2022), Stage 3a chronic kidney disease (CKD) (HCC) (02/26/2022), Stenosis of right carotid artery (11/12/2022), and Vitamin D  deficiency (12/17/2022).Bronnie Vasseur Aas   He presents with chief complaint of Medical Management of Chronic Issues (6 months HLD, and HTN. Pt is not fasting today //HM- none due ) and Dizziness (Pt c/o of dizziness after taking blood pressure medication with low diastolic reading at home (ranges 160/60) ) .   Discussed the use of AI scribe software for clinical note transcription with the patient, who  gave verbal consent to proceed.  History of Present Illness David [name] is an 89 year old male with hypertension who presents with low blood pressure and feeling sluggish.  He has been experiencing low blood pressure, with a reading  of 118/40 today, which is lower than his usual range of 160/50-60. He attributes this to taking hydralazine  later than usual. He monitors his blood pressure at home regularly.  He is currently on a regimen of hydralazine  three times a day, amlodipine  10 mg daily, olmesartan  hydrochlorothiazide 40/25 mg daily, and spironolactone  25 mg daily.  He has a history of consulting with a cardiologist and a nephrologist, with his last cardiology visit in April and a nephrology visit a couple of weeks ago.  No chest pain or shortness of breath. Recent lab work indicates stable kidney function with a GFR between the upper thirties and low forties, stable liver function, and good cholesterol levels.     ROS  Past Surgical History:  Procedure Laterality Date   COLON SURGERY     KNEE SURGERY  2009 or 2010   replaced knee cap   PROSTATE SURGERY  2012    Outpatient Medications Prior to Visit  Medication Sig Dispense Refill   Amino Acids (AMINO ACID PO) Take 5 tablets by mouth daily.     amLODipine  (NORVASC ) 10 MG tablet Take 1 tablet (10 mg total) by mouth at bedtime. 90 tablet 0   Ascorbic Acid (VITAMIN C) 1000 MG tablet Take 1,000 mg by mouth daily.     aspirin  EC 81 MG tablet Take 81 mg by mouth in the morning. Swallow whole.     B Complex Vitamins (VITAMIN B-COMPLEX) TABS Take 1 tablet by mouth every morning.     Cholecalciferol (VITAMIN D3) 50 MCG (2000 UT) TABS Take 2,000 Units by mouth daily.     Coenzyme Q10 (CO Q10 PO) Take 30 mLs by mouth daily.     Cyanocobalamin (VITAMIN B-12) 2500 MCG SUBL Place 2,500 mcg under the tongue daily.     finasteride (PROSCAR) 5 MG tablet Take 5 mg by mouth daily.     GARLIC PO Take by mouth.     MAGNESIUM GLYCINATE PO Take  240 mg by mouth daily.     olmesartan -hydrochlorothiazide (BENICAR  HCT) 40-25 MG tablet Take 1 tablet by mouth daily. 90 tablet 1   Omega-3 Fatty Acids (FISH OIL PO) Take 1 capsule by mouth daily.     OVER THE COUNTER MEDICATION Joint liquid 2,00 glucosamine, 200 chondrotin     pantoprazole  (PROTONIX ) 40 MG tablet Take 1 tablet (40 mg total) by mouth daily. 90 tablet 1   Probiotic Product (PROBIOTIC PO) Take 1 capsule by mouth daily.     spironolactone  (ALDACTONE ) 25 MG tablet Take 1 tablet (25 mg total) by mouth daily. 90 tablet 0   TURMERIC CURCUMIN PO Take 1 tablet by mouth daily.     Zinc  50 MG TABS Take 50 mg by mouth daily.     hydrALAZINE  (APRESOLINE ) 10 MG tablet Take 10 mg by mouth 3 (three) times daily.     amoxicillin -clavulanate (AUGMENTIN ) 875-125 MG tablet Take 1 tablet by mouth 2 (two) times daily. (Patient not taking: Reported on 07/13/2023) 20 tablet 0   benzonatate  (TESSALON ) 100 MG capsule Take 1-2 capsules (100-200 mg total) by mouth 3 (three) times daily as needed. (Patient not taking: Reported on 07/13/2023) 30 capsule 0   fluticasone  (FLONASE ) 50 MCG/ACT nasal spray Place 2 sprays into both nostrils daily. (Patient not taking: Reported on 07/13/2023) 16 g 0   Krill Oil (OMEGA-3) 500 MG CAPS Take 1 capsule by mouth daily. (Patient not taking: Reported on 07/13/2023)     No facility-administered medications prior to visit.  Family History  Problem Relation Age of Onset   Cancer Mother        breast   Heart disease Father        heart attack at age 18    Social History   Socioeconomic History   Marital status: Married    Spouse name: Orelia Binet   Number of children: 3   Years of education: 12   Highest education level: Not on file  Occupational History    Comment: retired  Tobacco Use   Smoking status: Former    Current packs/day: 0.00    Average packs/day: 2.0 packs/day for 20.0 years (40.0 ttl pk-yrs)    Types: Cigarettes    Start date: 02/24/1948    Quit  date: 02/24/1968    Years since quitting: 55.4    Passive exposure: Never   Smokeless tobacco: Never  Vaping Use   Vaping status: Never Used  Substance and Sexual Activity   Alcohol use: No   Drug use: No   Sexual activity: Not Currently  Other Topics Concern   Not on file  Social History Narrative   Lives at home with spouse   Caffeine use- occass coffee, tea   Social Drivers of Corporate investment banker Strain: Low Risk  (06/14/2023)   Overall Financial Resource Strain (CARDIA)    Difficulty of Paying Living Expenses: Not hard at all  Food Insecurity: No Food Insecurity (06/14/2023)   Hunger Vital Sign    Worried About Running Out of Food in the Last Year: Never true    Ran Out of Food in the Last Year: Never true  Transportation Needs: No Transportation Needs (06/14/2023)   PRAPARE - Administrator, Civil Service (Medical): No    Lack of Transportation (Non-Medical): No  Physical Activity: Sufficiently Active (06/14/2023)   Exercise Vital Sign    Days of Exercise per Week: 3 days    Minutes of Exercise per Session: 60 min  Stress: No Stress Concern Present (06/14/2023)   [employer] of Occupational Health - Occupational Stress Questionnaire    Feeling of Stress : Not at all  Social Connections: Moderately Integrated (06/14/2023)   Social Connection and Isolation Panel [NHANES]    Frequency of Communication with Friends and Family: More than three times a week    Frequency of Social Gatherings with Friends and Family: Once a week    Attends Religious Services: More than 4 times per year    Active Member of [employer] or Organizations: No    Attends Banker Meetings: Never    Marital Status: Married  Catering manager Violence: Not At Risk (06/14/2023)   Humiliation, Afraid, Rape, and Kick questionnaire    Fear of Current or Ex-Partner: No    Emotionally Abused: No    Physically Abused: No    Sexually Abused: No                                                                                                   Objective:  Physical Exam: BP (!) 118/40 (BP Location: Right Arm, Patient  Position: Sitting, Cuff Size: Normal) Comment: recheck  Pulse (!) 51   Temp 98.1 F (36.7 C) (Temporal)   Resp 18   Wt 192 lb (87.1 kg)   SpO2 97%   BMI 27.16 kg/m    Physical Exam VITALS: BP- 118/40 GENERAL: Alert, cooperative, well developed, no acute distress. HEENT: Normocephalic, normal oropharynx, moist mucous membranes. CHEST: Clear to auscultation bilaterally, no wheezes, rhonchi, or crackles. CARDIOVASCULAR: Normal heart rate and rhythm, S1 and S2 normal without murmurs. ABDOMEN: Soft, non-tender, non-distended, without organomegaly, normal bowel sounds. EXTREMITIES: No cyanosis or edema. NEUROLOGICAL: Cranial nerves grossly intact, moves all extremities without gross motor or sensory deficit.   No results found.  Recent Results (from the past 2160 hours)  Basic Metabolic Panel (BMET)     Status: Abnormal   Collection Time: 07/06/23  8:27 AM  Result Value Ref Range   Sodium 138 135 - 145 mEq/L   Potassium 4.6 3.5 - 5.1 mEq/L   Chloride 106 96 - 112 mEq/L   CO2 25 19 - 32 mEq/L   Glucose, Bld 96 70 - 99 mg/dL   BUN 33 (H) 6 - 23 mg/dL   Creatinine, Ser 4.09 (H) 0.40 - 1.50 mg/dL   GFR 81.19 (L) >14.78 mL/min    Comment: Calculated using the CKD-EPI Creatinine Equation (2021)   Calcium 9.7 8.4 - 10.5 mg/dL  Hepatic function panel     Status: None   Collection Time: 07/06/23  8:27 AM  Result Value Ref Range   Total Bilirubin 0.6 0.2 - 1.2 mg/dL   Bilirubin, Direct 0.1 0.0 - 0.3 mg/dL   Alkaline Phosphatase 63 39 - 117 U/L   AST 17 0 - 37 U/L   ALT 13 0 - 53 U/L   Total Protein 6.6 6.0 - 8.3 g/dL   Albumin 4.4 3.5 - 5.2 g/dL  Lipid Profile     Status: Abnormal   Collection Time: 07/06/23  8:27 AM  Result Value Ref Range   Cholesterol 185 0 - 200 mg/dL    Comment: ATP III Classification       Desirable:  < 200 mg/dL                Borderline High:  200 - 239 mg/dL          High:  > = 295 mg/dL   Triglycerides 621.3 0.0 - 149.0 mg/dL    Comment: Normal:  <086 mg/dLBorderline High:  150 - 199 mg/dL   HDL 57.84 >69.62 mg/dL   VLDL 95.2 0.0 - 84.1 mg/dL   LDL Cholesterol 324 (H) 0 - 99 mg/dL   Total CHOL/HDL Ratio 3     Comment:                Men          Women1/2 Average Risk     3.4          3.3Average Risk          5.0          4.42X Average Risk          9.6          7.13X Average Risk          15.0          11.0                       NonHDL 120.60     Comment: NOTE:  Non-HDL  goal should be 30 mg/dL higher than patient's LDL goal (i.e. LDL goal of < 70 mg/dL, would have non-HDL goal of < 100 mg/dL)        Carnell Christian, MD, MS

## 2023-07-13 NOTE — Patient Instructions (Signed)
  VISIT SUMMARY: Today, we discussed your low blood pressure and feelings of sluggishness. Your blood pressure reading was lower than usual, which you believe is due to taking your hydralazine  later than usual. We reviewed your current medications and recent lab results, which show stable kidney and liver function, as well as good cholesterol levels.  YOUR PLAN: -HYPERTENSION: Hypertension means high blood pressure. Your blood pressure was low today at 118/40, which made you feel sluggish. We will reduce your hydralazine  10 mg to 5 mg (1/2 tablet)as needed if your blood pressure exceeds 140/90. Continue taking amlodipine  10 mg daily, olmesartan -hydrochlorothiazide 40/25 mg daily, and spironolactone  25 mg daily. Please check your blood pressure twice daily, in the morning an hour after taking your medication and in the evening before bed. We will coordinate with the pharmacy team and hypertension clinic for further management.  -CHRONIC KIDNEY DISEASE 3B: Chronic Kidney Disease means your kidneys are not working as well as they should. Your kidney function is stable with a GFR between the upper 30s and low 40s. Please follow up with your nephrologist when possible.  INSTRUCTIONS: Please schedule a follow-up appointment in two weeks.

## 2023-07-15 ENCOUNTER — Encounter: Payer: Self-pay | Admitting: Family Medicine

## 2023-07-15 ENCOUNTER — Ambulatory Visit (INDEPENDENT_AMBULATORY_CARE_PROVIDER_SITE_OTHER): Admitting: Family Medicine

## 2023-07-15 VITALS — BP 146/60 | HR 46 | Temp 97.2°F | Wt 188.0 lb

## 2023-07-15 DIAGNOSIS — J22 Unspecified acute lower respiratory infection: Secondary | ICD-10-CM | POA: Diagnosis not present

## 2023-07-15 DIAGNOSIS — J4520 Mild intermittent asthma, uncomplicated: Secondary | ICD-10-CM

## 2023-07-15 MED ORDER — DM-GUAIFENESIN ER 30-600 MG PO TB12: 1.0000 | ORAL_TABLET | Freq: Two times a day (BID) | ORAL | 0 refills | Status: AC

## 2023-07-15 MED ORDER — AZITHROMYCIN 250 MG PO TABS: ORAL_TABLET | ORAL | 0 refills | Status: AC

## 2023-07-15 MED ORDER — PREDNISONE 10 MG PO TABS: 10.0000 mg | ORAL_TABLET | Freq: Two times a day (BID) | ORAL | 0 refills | Status: AC

## 2023-07-15 NOTE — Progress Notes (Signed)
 Established Patient Office Visit   Subjective:  Patient ID: David [name], male    DOB: [birth year]  Age: 89 y.o. MRN: [id number]  Chief Complaint  Patient presents with   Cough    Cough that's down in chest causing pain when breathing in, congestion, slight wheezing after coughing; no fever or other symptoms; no meds tried    Cough Associated symptoms include wheezing. Pertinent negatives include no eye redness, myalgias, rash or shortness of breath.   Encounter Diagnoses  Name Primary?   Lower respiratory infection Yes   Mild intermittent reactive airway disease without complication    6-day history of nasal congestion postnasal drip that is recently moved into the chest.  He has a cough that is productive of yellow to clear phlegm.  There has been some mild wheezing.  No fevers or chills.   Review of Systems  Constitutional: Negative.   HENT:  Positive for congestion.   Eyes:  Negative for blurred vision, discharge and redness.  Respiratory:  Positive for cough, sputum production and wheezing. Negative for shortness of breath.   Cardiovascular: Negative.   Gastrointestinal:  Negative for abdominal pain.  Genitourinary: Negative.   Musculoskeletal: Negative.  Negative for myalgias.  Skin:  Negative for rash.  Neurological:  Negative for tingling, loss of consciousness and weakness.  Endo/Heme/Allergies:  Negative for polydipsia.     Current Outpatient Medications:    Amino Acids (AMINO ACID PO), Take 5 tablets by mouth daily., Disp: , Rfl:    amLODipine  (NORVASC ) 10 MG tablet, Take 1 tablet (10 mg total) by mouth at bedtime., Disp: 90 tablet, Rfl: 0   Ascorbic Acid (VITAMIN C) 1000 MG tablet, Take 1,000 mg by mouth daily., Disp: , Rfl:    aspirin  EC 81 MG tablet, Take 81 mg by mouth in the morning. Swallow whole., Disp: , Rfl:    azithromycin  (ZITHROMAX ) 250 MG tablet, Take 2 tablets on day 1, then 1 tablet daily on days 2 through 5, Disp: 6 tablet, Rfl: 0   B Complex  Vitamins (VITAMIN B-COMPLEX) TABS, Take 1 tablet by mouth every morning., Disp: , Rfl:    Cholecalciferol (VITAMIN D3) 50 MCG (2000 UT) TABS, Take 2,000 Units by mouth daily., Disp: , Rfl:    Coenzyme Q10 (CO Q10 PO), Take 30 mLs by mouth daily., Disp: , Rfl:    Cyanocobalamin (VITAMIN B-12) 2500 MCG SUBL, Place 2,500 mcg under the tongue daily., Disp: , Rfl:    dextromethorphan-guaiFENesin  (MUCINEX  DM) 30-600 MG 12hr tablet, Take 1 tablet by mouth 2 (two) times daily for 7 days., Disp: 14 tablet, Rfl: 0   finasteride (PROSCAR) 5 MG tablet, Take 5 mg by mouth daily., Disp: , Rfl:    GARLIC PO, Take by mouth., Disp: , Rfl:    hydrALAZINE  (APRESOLINE ) 10 MG tablet, Take 0.5 tablets (5 mg total) by mouth 3 (three) times daily as needed (Blood pressure > 140/>90)., Disp: 90 tablet, Rfl: 0   MAGNESIUM GLYCINATE PO, Take 240 mg by mouth daily., Disp: , Rfl:    olmesartan -hydrochlorothiazide (BENICAR  HCT) 40-25 MG tablet, Take 1 tablet by mouth daily., Disp: 90 tablet, Rfl: 1   Omega-3 Fatty Acids (FISH OIL PO), Take 1 capsule by mouth daily., Disp: , Rfl:    OVER THE COUNTER MEDICATION, Joint liquid 2,00 glucosamine, 200 chondrotin, Disp: , Rfl:    pantoprazole  (PROTONIX ) 40 MG tablet, Take 1 tablet (40 mg total) by mouth daily., Disp: 90 tablet, Rfl: 1   predniSONE  (DELTASONE )  10 MG tablet, Take 1 tablet (10 mg total) by mouth 2 (two) times daily with a meal for 5 days., Disp: 10 tablet, Rfl: 0   Probiotic Product (PROBIOTIC PO), Take 1 capsule by mouth daily., Disp: , Rfl:    spironolactone  (ALDACTONE ) 25 MG tablet, Take 1 tablet (25 mg total) by mouth daily., Disp: 90 tablet, Rfl: 0   TURMERIC CURCUMIN PO, Take 1 tablet by mouth daily., Disp: , Rfl:    Zinc  50 MG TABS, Take 50 mg by mouth daily., Disp: , Rfl:    Objective:     BP (!) 146/60   Pulse (!) 46   Temp (!) 97.2 F (36.2 C) (Temporal)   Wt 188 lb (85.3 kg)   SpO2 94%   BMI 26.59 kg/m    Physical Exam Constitutional:       General: He is not in acute distress.    Appearance: Normal appearance. He is not ill-appearing, toxic-appearing or diaphoretic.  HENT:     Head: Normocephalic and atraumatic.     Right Ear: External ear normal.     Left Ear: External ear normal.  Eyes:     General: No scleral icterus.       Right eye: No discharge.        Left eye: No discharge.     Extraocular Movements: Extraocular movements intact.     Conjunctiva/sclera: Conjunctivae normal.  Cardiovascular:     Rate and Rhythm: Normal rate and regular rhythm.  Pulmonary:     Effort: Pulmonary effort is normal. No respiratory distress.     Breath sounds: Normal breath sounds. No wheezing, rhonchi or rales.  Musculoskeletal:     Cervical back: No rigidity or tenderness.  Lymphadenopathy:     Cervical: No cervical adenopathy.  Skin:    General: Skin is warm and dry.  Neurological:     Mental Status: He is alert and oriented to person, place, and time.  Psychiatric:        Mood and Affect: Mood normal.        Behavior: Behavior normal.      No results found for any visits on 07/15/23.    The ASCVD Risk score (Arnett DK, et al., 2019) failed to calculate for the following reasons:   The 2019 ASCVD risk score is only valid for ages 89 to 34    Assessment & Plan:   Lower respiratory infection -     Azithromycin ; Take 2 tablets on day 1, then 1 tablet daily on days 2 through 5  Dispense: 6 tablet; Refill: 0 -     DM-guaiFENesin  ER; Take 1 tablet by mouth 2 (two) times daily for 7 days.  Dispense: 14 tablet; Refill: 0  Mild intermittent reactive airway disease without complication -     predniSONE ; Take 1 tablet (10 mg total) by mouth 2 (two) times daily with a meal for 5 days.  Dispense: 10 tablet; Refill: 0    Return Has follow-up scheduled in a few weeks.  Return sooner if not improving.Tonna Frederic, MD

## 2023-07-20 ENCOUNTER — Ambulatory Visit (INDEPENDENT_AMBULATORY_CARE_PROVIDER_SITE_OTHER): Admitting: Family Medicine

## 2023-07-20 ENCOUNTER — Encounter: Payer: Self-pay | Admitting: Family Medicine

## 2023-07-20 ENCOUNTER — Other Ambulatory Visit: Payer: Self-pay | Admitting: Family Medicine

## 2023-07-20 ENCOUNTER — Ambulatory Visit (HOSPITAL_COMMUNITY)
Admission: RE | Admit: 2023-07-20 | Discharge: 2023-07-20 | Disposition: A | Discharge status: Home / Self Care | Source: Ambulatory Visit | Attending: Family Medicine | Admitting: Family Medicine

## 2023-07-20 ENCOUNTER — Telehealth: Payer: Self-pay

## 2023-07-20 VITALS — BP 147/67 | HR 65 | Temp 97.4°F | Resp 18 | Wt 185.2 lb

## 2023-07-20 DIAGNOSIS — J189 Pneumonia, unspecified organism: Secondary | ICD-10-CM | POA: Insufficient documentation

## 2023-07-20 DIAGNOSIS — J4520 Mild intermittent asthma, uncomplicated: Secondary | ICD-10-CM

## 2023-07-20 DIAGNOSIS — I1A0 Resistant hypertension: Secondary | ICD-10-CM

## 2023-07-20 LAB — COMPREHENSIVE METABOLIC PANEL WITH GFR
ALT: 12 U/L (ref 0–53)
AST: 14 U/L (ref 0–37)
Albumin: 4.4 g/dL (ref 3.5–5.2)
Alkaline Phosphatase: 53 U/L (ref 39–117)
BUN: 40 mg/dL — ABNORMAL HIGH (ref 6–23)
CO2: 22 meq/L (ref 19–32)
Calcium: 9.7 mg/dL (ref 8.4–10.5)
Chloride: 104 meq/L (ref 96–112)
Creatinine, Ser: 1.77 mg/dL — ABNORMAL HIGH (ref 0.40–1.50)
GFR: 33.93 mL/min — ABNORMAL LOW (ref >60.00)
Glucose, Bld: 95 mg/dL (ref 70–99)
Potassium: 4.4 meq/L (ref 3.5–5.1)
Sodium: 136 meq/L (ref 135–145)
Total Bilirubin: 0.6 mg/dL (ref 0.2–1.2)
Total Protein: 7 g/dL (ref 6.0–8.3)

## 2023-07-20 LAB — CBC WITH DIFFERENTIAL/PLATELET
Basophils Absolute: 0 10*3/uL (ref 0.0–0.1)
Basophils Relative: 0.3 % (ref 0.0–3.0)
Eosinophils Absolute: 0 10*3/uL (ref 0.0–0.7)
Eosinophils Relative: 0.3 % (ref 0.0–5.0)
HCT: 40.7 % (ref 39.0–52.0)
Hemoglobin: 13.8 g/dL (ref 13.0–17.0)
Lymphocytes Relative: 6.9 % — ABNORMAL LOW (ref 12.0–46.0)
Lymphs Abs: 0.6 10*3/uL — ABNORMAL LOW (ref 0.7–4.0)
MCHC: 33.8 g/dL (ref 30.0–36.0)
MCV: 86.6 fl (ref 78.0–100.0)
Monocytes Absolute: 0.8 10*3/uL (ref 0.1–1.0)
Monocytes Relative: 9 % (ref 3.0–12.0)
Neutro Abs: 7.2 10*3/uL (ref 1.4–7.7)
Neutrophils Relative %: 83.5 % — ABNORMAL HIGH (ref 43.0–77.0)
Platelets: 299 10*3/uL (ref 150.0–400.0)
RBC: 4.7 Mil/uL (ref 4.22–5.81)
RDW: 14.7 % (ref 11.5–15.5)
WBC: 8.7 10*3/uL (ref 4.0–10.5)

## 2023-07-20 LAB — POCT INFLUENZA A/B
Influenza A, POC: NEGATIVE
Influenza B, POC: NEGATIVE

## 2023-07-20 LAB — POC COVID19 BINAXNOW: SARS Coronavirus 2 Ag: NEGATIVE

## 2023-07-20 LAB — BRAIN NATRIURETIC PEPTIDE: Pro B Natriuretic peptide (BNP): 68 pg/mL (ref 0.0–100.0)

## 2023-07-20 LAB — C-REACTIVE PROTEIN: CRP: <1 mg/dL (ref 0.5–20.0)

## 2023-07-20 MED ORDER — PROMETHAZINE-DM 6.25-15 MG/5ML PO SYRP: 5.0000 mL | ORAL_SOLUTION | Freq: Four times a day (QID) | ORAL | 0 refills | Status: DC | PRN

## 2023-07-20 NOTE — Patient Instructions (Signed)
  VISIT SUMMARY: During your visit today, we discussed your ongoing cough, which has worsened over the past two weeks despite your current medications. We reviewed your recent chest x-ray and discussed the findings, including a spot on your left lung and other changes. We also talked about your history of recurrent pneumonia and your elevated blood pressure readings.  YOUR PLAN: -ACUTE COUGH WITH PLEURAL EFFUSION: Your cough has been persistent and worsening, and the chest x-ray showed a spot on your left lung and possible fluid buildup. We need to do further tests to understand the cause, which could be an infection or related to your heart. We will order a CT scan of your chest, blood tests, and tests for COVID and flu. Continue taking your current medications, including doxycycline and prednisone , and use the promethazine DM cough syrup as prescribed. If your symptoms get worse, go to the emergency department immediately.  -RECURRENT PNEUMONIA: You have a history of pneumonia, and your current symptoms are similar to past episodes. However, the recent chest x-ray did not show pneumonia. We will continue to monitor your condition closely.  -HYPERTENSION: Your blood pressure is higher than normal, which can happen when you are ill. We will keep it slightly elevated for now to avoid any risk of low blood pressure. We will continue to monitor it.  INSTRUCTIONS: Please follow up with the recommended CT scan and blood tests. Continue taking your medications as prescribed. If you experience any worsening of symptoms, such as fever, chills, chest pain, or shortness of breath, go to the emergency department immediately.

## 2023-07-20 NOTE — Progress Notes (Signed)
 Complex Care Management Note  Care Guide Note 07/20/2023 Name: David [name] MRN: [id number] DOB: 26-Aug-1933  David [name] is a 89 y.o. year old male who sees Catheryn Cluck, MD for primary care. I reached out to Valentine Gasmen by phone today to offer complex care management services.  [name] was given information about Complex Care Management services today including:   The Complex Care Management services include support from the care team which includes your Nurse Care Manager, Clinical Social Worker, or Pharmacist.  The Complex Care Management team is here to help remove barriers to the health concerns and goals most important to you. Complex Care Management services are voluntary, and the patient may decline or stop services at any time by request to their care team member.   Complex Care Management Consent Status: Patient did not agree to participate in complex care management services at this time.  Follow up plan:  Patient plans to follow up with PCP.  Encounter Outcome:  Patient Refused  Cayetano Coco [facility], [facility] Health Care Management Assistant Direct Dial: [phone]  Fax: [phone]

## 2023-07-20 NOTE — Progress Notes (Signed)
 Assessment & Plan   Assessment/Plan:     Assessment & Plan Acute cough with pleural effusion David [name] [name] presents with a two-week acute cough, worsening despite prednisone , azithromycin , and doxycycline. Chest x-ray showed no pneumonia but indicated a spot on the left lung, tiny effusions versus pleural thickening, and blunting of the left costophrenic angle. Differential diagnosis includes pneumonia, other respiratory infections, or cardiac involvement. Bilateral mild crackles were heard on lung exam, possibly from pleural effusion. EKG shows sinus bradycardia with first degree AV block and RBBB, consistent with his baseline. No edema or significant cardiac changes were noted on recent TTE. Further workup is warranted to rule out infectious etiologies and assess cardiac involvement. - Order CT chest without contrast - Order CBC, CMP, CRP, BNP, and procalcitonin - Test for COVID and flu - Prescribe promethazine DM cough syrup - Continue doxycycline and prednisone  - Advise to go to the emergency department if symptoms worsen, including fevers, chills, chest pain, or shortness of breath  Recurrent pneumonia Recurrent pneumonia raises concern for pneumonia as a potential cause of current symptoms. Previous pneumonia was in January of last year. Current symptoms are similar to past episodes, but chest x-ray did not show pneumonia.  Hypertension Blood pressure is elevated at 147/67 mmHg. Management is complicated by his current illness. Decision made to maintain a slightly higher blood pressure during this period to avoid hypotension.      Medications Discontinued During This Encounter  Medication Reason   promethazine-dextromethorphan (PROMETHAZINE-DM) 6.25-15 MG/5ML syrup Reorder    Return in about 1 day (around 07/21/2023) for cough.        Subjective:   Encounter date: 07/20/2023  David [name] [name] is a 89 y.o. male who has Rectus diastasis; Community acquired pneumonia of left lower  lobe of lung; Acute hypoxic respiratory failure (HCC); Pleural effusion; Stage 3a chronic kidney disease (CKD) (HCC); HTN (hypertension); Normocytic anemia; Recurrent pneumonia; Empyema (HCC); Protein-calorie malnutrition, severe; Dysphagia; Stenosis of right carotid artery; Proteinuria; Chronic fatigue; Spinal stenosis of lumbar region without neurogenic claudication; Bradycardia; Mixed hyperlipidemia; Benign prostatic hyperplasia with lower urinary tract symptoms; Memory loss; BBB (bundle branch block); Vitamin D  deficiency; History of colon cancer; Solar lentigo; Feels cold; History of pneumonia; Pulmonary nodule; and Stage 3b chronic kidney disease (HCC) on their problem list..   He  has a past medical history of Acute hypoxic respiratory failure (HCC) (02/26/2022), BBB (bundle branch block) (11/12/2022), Benign prostatic hyperplasia with lower urinary tract symptoms (11/12/2022), Bradycardia (11/12/2022), Chronic fatigue (11/12/2022), Community acquired pneumonia of left lower lobe of lung (02/26/2022), Dysphagia (05/08/2022), Empyema (HCC) (02/28/2022), Feels cold (01/14/2023), History of colon cancer (12/17/2022), History of pneumonia (01/14/2023), HTN (hypertension) (02/26/2022), Hypertension, Memory loss (11/12/2022), Mixed hyperlipidemia (11/12/2022), Normocytic anemia (02/26/2022), Pleural effusion (02/26/2022), Protein-calorie malnutrition, severe (03/02/2022), Proteinuria (11/12/2022), Pulmonary nodule (01/14/2023), Rectus diastasis (01/04/2012), Recurrent pneumonia (02/27/2022), Solar lentigo (01/14/2023), Spinal stenosis of lumbar region without neurogenic claudication (11/12/2022), Stage 3a chronic kidney disease (CKD) (HCC) (02/26/2022), Stenosis of right carotid artery (11/12/2022), and Vitamin D  deficiency (12/17/2022).David [name] [name]   He presents with chief complaint of URI (Pt c/o of cough and congestion at night unable to sleep for 1 week. Pt is using Rx and inhaler that was prescribed but isn't helping  much ) and Hypertension (Pt c/o of elevated reading at home between 148/56. Pt is taking Hydralazine  10mg  ) .   Discussed the use of AI scribe software for clinical note transcription with the patient, who gave verbal consent to proceed.  History of Present Illness David [name]  David [name] [name] is an 89 year old male with recurrent pneumonia who presents with an ongoing cough.  He has been experiencing an ongoing cough that has worsened over the past two weeks, accompanied by dyspnea, particularly nocturnal, and occasional chest pain localized to the chest area. The sputum is described as white, yellow, and sometimes orange. Despite treatment with prednisone , azithromycin , and an inhaler, his symptoms have worsened, leading to a visit to urgent care where a chest x-ray was performed. The x-ray reportedly did not show pneumonia but indicated a spot on the left lung and other changes. He was given promethazine DM syrup and a steroid shot at urgent care.  He has a history of recurrent pneumonia, with the last episode occurring in January of the previous year. He completed a course of azithromycin  and is currently on doxycycline, which was prescribed by urgent care. He continues to take prednisone . No fevers or chills, but he reports wheezing when coughing and feeling cold all the time. No swelling in the legs and no recent episodes of pneumonia since last year.  His blood pressure readings have been elevated, with a recent reading of 147/67. He mentions that his blood pressure tends to run high when he is ill. He reports no significant changes in his heart condition and recalls having a transthoracic echocardiogram about a month ago.       Past Surgical History:  Procedure Laterality Date   COLON SURGERY     KNEE SURGERY  2009 or 2010   replaced knee cap   PROSTATE SURGERY  2012    Outpatient Medications Prior to Visit  Medication Sig Dispense Refill   albuterol  (VENTOLIN  HFA) 108 (90 Base) MCG/ACT inhaler  Inhale 2 puffs into the lungs.     Amino Acids (AMINO ACID PO) Take 5 tablets by mouth daily.     amLODipine  (NORVASC ) 10 MG tablet Take 1 tablet (10 mg total) by mouth at bedtime. 90 tablet 0   Ascorbic Acid (VITAMIN C) 1000 MG tablet Take 1,000 mg by mouth daily.     aspirin  EC 81 MG tablet Take 81 mg by mouth in the morning. Swallow whole.     azithromycin  (ZITHROMAX ) 250 MG tablet Take 2 tablets on day 1, then 1 tablet daily on days 2 through 5 6 tablet 0   B Complex Vitamins (VITAMIN B-COMPLEX) TABS Take 1 tablet by mouth every morning.     Cholecalciferol (VITAMIN D3) 50 MCG (2000 UT) TABS Take 2,000 Units by mouth daily.     Coenzyme Q10 (CO Q10 PO) Take 30 mLs by mouth daily.     Cyanocobalamin (VITAMIN B-12) 2500 MCG SUBL Place 2,500 mcg under the tongue daily.     dextromethorphan-guaiFENesin  (MUCINEX  DM) 30-600 MG 12hr tablet Take 1 tablet by mouth 2 (two) times daily for 7 days. 14 tablet 0   doxycycline (VIBRA-TABS) 100 MG tablet Take 100 mg by mouth.     finasteride (PROSCAR) 5 MG tablet Take 5 mg by mouth daily.     GARLIC PO Take by mouth.     hydrALAZINE  (APRESOLINE ) 10 MG tablet Take 0.5 tablets (5 mg total) by mouth 3 (three) times daily as needed (Blood pressure > 140/>90). 90 tablet 0   MAGNESIUM GLYCINATE PO Take 240 mg by mouth daily.     olmesartan -hydrochlorothiazide (BENICAR  HCT) 40-25 MG tablet Take 1 tablet by mouth daily. 90 tablet 1   Omega-3 Fatty Acids (FISH OIL PO) Take 1 capsule by mouth daily.  OVER THE COUNTER MEDICATION Joint liquid 2,00 glucosamine, 200 chondrotin     pantoprazole  (PROTONIX ) 40 MG tablet Take 1 tablet (40 mg total) by mouth daily. 90 tablet 1   predniSONE  (DELTASONE ) 10 MG tablet Take 1 tablet (10 mg total) by mouth 2 (two) times daily with a meal for 5 days. 10 tablet 0   Probiotic Product (PROBIOTIC PO) Take 1 capsule by mouth daily.     spironolactone  (ALDACTONE ) 25 MG tablet Take 1 tablet (25 mg total) by mouth daily. 90 tablet 0    TURMERIC CURCUMIN PO Take 1 tablet by mouth daily.     Zinc  50 MG TABS Take 50 mg by mouth daily.     promethazine -dextromethorphan (PROMETHAZINE -DM) 6.25-15 MG/5ML syrup Take 5 mLs by mouth.     No facility-administered medications prior to visit.    Family History  Problem Relation Age of Onset   Cancer Mother        breast   Heart disease Father        heart attack at age 91    Social History   Socioeconomic History   Marital status: Married    Spouse name: Orelia Binet   Number of children: 3   Years of education: 12   Highest education level: Not on file  Occupational History    Comment: retired  Tobacco Use   Smoking status: Former    Current packs/day: 0.00    Average packs/day: 2.0 packs/day for 20.0 years (40.0 ttl pk-yrs)    Types: Cigarettes    Start date: 02/24/1948    Quit date: 02/24/1968    Years since quitting: 55.4    Passive exposure: Never   Smokeless tobacco: Never  Vaping Use   Vaping status: Never Used  Substance and Sexual Activity   Alcohol use: No   Drug use: No   Sexual activity: Not Currently  Other Topics Concern   Not on file  Social History Narrative   Lives at home with spouse   Caffeine use- occass coffee, tea   Social Drivers of Corporate investment banker Strain: Low Risk  (06/14/2023)   Overall Financial Resource Strain (CARDIA)    Difficulty of Paying Living Expenses: Not hard at all  Food Insecurity: Low Risk  (07/18/2023)   Received from [facility]   Hunger Vital Sign    Worried About Running Out of Food in the Last Year: Never true    Ran Out of Food in the Last Year: Never true  Transportation Needs: No Transportation Needs (07/18/2023)   Received from [facility]    In the past 12 months, has lack of reliable transportation kept you from medical appointments, meetings, work or from getting things needed for daily living? : No  Physical Activity: Sufficiently Active (06/14/2023)   Exercise Vital Sign     Days of Exercise per Week: 3 days    Minutes of Exercise per Session: 60 min  Stress: No Stress Concern Present (06/14/2023)   [employer] of Occupational Health - Occupational Stress Questionnaire    Feeling of Stress : Not at all  Social Connections: Moderately Integrated (06/14/2023)   Social Connection and Isolation Panel [NHANES]    Frequency of Communication with Friends and Family: More than three times a week    Frequency of Social Gatherings with Friends and Family: Once a week    Attends Religious Services: More than 4 times per year    Active Member of [employer] or Organizations:  No    Attends Banker Meetings: Never    Marital Status: Married  Catering manager Violence: Not At Risk (06/14/2023)   Humiliation, Afraid, Rape, and Kick questionnaire    Fear of Current or Ex-Partner: No    Emotionally Abused: No    Physically Abused: No    Sexually Abused: No                                                                                                  Objective:  Physical Exam: BP (!) 147/67 (BP Location: Right Arm, Patient Position: Sitting, Cuff Size: Large) Comment: recheck Pt home machine in clinic  Pulse 65   Temp (!) 97.4 F (36.3 C) (Oral)   Resp 18   Wt 185 lb 3.2 oz (84 kg)   SpO2 94%   BMI 26.20 kg/m    Physical Exam  GENERAL: Alert, cooperative, well developed, no acute distress. HEENT: Normocephalic, normal oropharynx, moist mucous membranes. CHEST: Bilateral mild crackles, possible pleural effusion. CARDIOVASCULAR: Normal heart rate and rhythm, S1 and S2 normal without murmurs. ABDOMEN: Soft, non-tender, non-distended, without organomegaly, normal bowel sounds. EXTREMITIES: No cyanosis or edema. NEUROLOGICAL: Cranial nerves grossly intact, moves all extremities without gross motor or sensory deficit.  Sinus Bradycardia -First degree A-V block  PRi = 238 -Right bundle branch block with left axis -bifascicular block.   No results  found.  Recent Results (from the past 2160 hours)  Basic Metabolic Panel (BMET)     Status: Abnormal   Collection Time: 07/06/23  8:27 AM  Result Value Ref Range   Sodium 138 135 - 145 mEq/L   Potassium 4.6 3.5 - 5.1 mEq/L   Chloride 106 96 - 112 mEq/L   CO2 25 19 - 32 mEq/L   Glucose, Bld 96 70 - 99 mg/dL   BUN 33 (H) 6 - 23 mg/dL   Creatinine, Ser 5.36 (H) 0.40 - 1.50 mg/dL   GFR 64.40 (L) >34.74 mL/min    Comment: Calculated using the CKD-EPI Creatinine Equation (2021)   Calcium 9.7 8.4 - 10.5 mg/dL  Hepatic function panel     Status: None   Collection Time: 07/06/23  8:27 AM  Result Value Ref Range   Total Bilirubin 0.6 0.2 - 1.2 mg/dL   Bilirubin, Direct 0.1 0.0 - 0.3 mg/dL   Alkaline Phosphatase 63 39 - 117 U/L   AST 17 0 - 37 U/L   ALT 13 0 - 53 U/L   Total Protein 6.6 6.0 - 8.3 g/dL   Albumin 4.4 3.5 - 5.2 g/dL  Lipid Profile     Status: Abnormal   Collection Time: 07/06/23  8:27 AM  Result Value Ref Range   Cholesterol 185 0 - 200 mg/dL    Comment: ATP III Classification       Desirable:  < 200 mg/dL               Borderline High:  200 - 239 mg/dL          High:  > = 259 mg/dL   Triglycerides 563.8 0.0 - 149.0  mg/dL    Comment: Normal:  <829 mg/dLBorderline High:  150 - 199 mg/dL   HDL 56.21 >30.86 mg/dL   VLDL 57.8 0.0 - 46.9 mg/dL   LDL Cholesterol 629 (H) 0 - 99 mg/dL   Total CHOL/HDL Ratio 3     Comment:                Men          Women1/2 Average Risk     3.4          3.3Average Risk          5.0          4.42X Average Risk          9.6          7.13X Average Risk          15.0          11.0                       NonHDL 120.60     Comment: NOTE:  Non-HDL goal should be 30 mg/dL higher than patient's LDL goal (i.e. LDL goal of < 70 mg/dL, would have non-HDL goal of < 100 mg/dL)  POC [id number]     Status: Normal   Collection Time: 07/20/23  9:22 AM  Result Value Ref Range   SARS Coronavirus 2 Ag Negative Negative  POCT Influenza A/B     Status: Normal    Collection Time: 07/20/23  9:22 AM  Result Value Ref Range   Influenza A, POC Negative Negative   Influenza B, POC Negative Negative        Carnell Christian, MD, MS

## 2023-07-21 ENCOUNTER — Ambulatory Visit: Payer: Self-pay | Admitting: Family Medicine

## 2023-07-21 ENCOUNTER — Ambulatory Visit (INDEPENDENT_AMBULATORY_CARE_PROVIDER_SITE_OTHER): Admitting: Family Medicine

## 2023-07-21 ENCOUNTER — Ambulatory Visit: Admitting: Internal Medicine

## 2023-07-21 ENCOUNTER — Encounter: Payer: Self-pay | Admitting: Family Medicine

## 2023-07-21 ENCOUNTER — Telehealth: Payer: Self-pay | Admitting: Family Medicine

## 2023-07-21 VITALS — BP 140/50 | HR 60 | Resp 18 | Wt 185.3 lb

## 2023-07-21 DIAGNOSIS — N1832 Chronic kidney disease, stage 3b: Secondary | ICD-10-CM

## 2023-07-21 DIAGNOSIS — R0989 Other specified symptoms and signs involving the circulatory and respiratory systems: Secondary | ICD-10-CM | POA: Diagnosis not present

## 2023-07-21 DIAGNOSIS — J189 Pneumonia, unspecified organism: Secondary | ICD-10-CM | POA: Diagnosis not present

## 2023-07-21 DIAGNOSIS — I1A0 Resistant hypertension: Secondary | ICD-10-CM | POA: Diagnosis not present

## 2023-07-21 DIAGNOSIS — A310 Pulmonary mycobacterial infection: Secondary | ICD-10-CM

## 2023-07-21 NOTE — Patient Instructions (Addendum)
  VISIT SUMMARY: Today, you came in for a follow-up visit after your CT scan showed some unusual findings. You mentioned that you are feeling better overall, with your appetite returning after a few days of not eating. You still have some coughing, wheezing, and shortness of breath, but no chest pain, fever, or chills. Your blood pressure was a bit high today, and you have been managing it with your medications. Your recent lab work showed stable kidney function and no signs of heart failure.  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 Continue taking doxycycline and prednisone  as prescribed. You will be referred to a pulmonologist at [facility] for further evaluation and management. If you experience severe symptoms like chest pain, wheezing, shortness of breath, or fever, go to the emergency department. Use your albuterol  inhaler every 4 hours as needed and take promethazine DM syrup for symptom relief.  -HYPERTENSION WITH LOW DIASTOLIC PRESSURE: You have high blood pressure with a consistently low diastolic pressure. Your current blood pressure reading is 154/59. To manage this, you should stop taking hydralazine  and monitor your blood pressure closely. Take the full 10 mg dose of amlodipine  if your diastolic pressure is above 90, take half the dose (5 mg) if it is between 60 and 90, and do not take it if your diastolic pressure is below 60. Make sure to drink enough fluids.  -CHRONIC KIDNEY DISEASE, STAGE 3B: You have stage 3 chronic kidney disease, but your kidney function is stable and there are no signs of deterioration. Continue with your current management plan.  INSTRUCTIONS: You will be referred to a pulmonologist at [facility]  for further evaluation and management of your respiratory condition. Continue taking your medications as prescribed and monitor your blood pressure closely. If you experience severe symptoms such as chest pain, wheezing, shortness of breath, or fever, go to the emergency department.

## 2023-07-21 NOTE — Progress Notes (Signed)
 Assessment & Plan   Assessment/Plan:   Assessment & Plan Respiratory infection with bronchitis Persistent respiratory infection with signs of bronchitis. CT scan reveals nodular clustering in the right middle lobe, suggesting small airway disease or Mycobacterium avium infection. History of smoking may contribute to underlying COPD, potentially exacerbated by an acute viral process. Treated with doxycycline, azithromycin , and prednisone . Higher risk due to age, hypertension, and lab results. Requires pulmonology referral for further management. - Continue doxycycline and prednisone  as prescribed. - Refer to pulmonology at [facility] for further evaluation and management. - Advise emergency department visit if severe symptoms such as chest pain, wheezing, shortness of breath, or fever occur. - Use albuterol  inhaler as needed every 4 hours, 2 puffs. - Use promethazine  DM syrup for symptom management.  Wheezing and shortness of breath Intermittent wheezing and shortness of breath, possibly related to bronchitis or underlying COPD. Albuterol  inhaler use reported, but effectiveness is uncertain. - Use albuterol  inhaler as needed every 4 hours, 2 puffs. - Advise emergency care if severe symptoms develop.  Hypertension with low diastolic pressure Hypertension with consistently low diastolic pressure. Current blood pressure is 154/59. Home readings slightly better. Risk of adverse effects if diastolic pressure drops too low. Adjust antihypertensive medications to manage blood pressure without causing hypotension. - Discontinue hydralazine . - Monitor blood pressure closely, especially diastolic pressure. - Administer full 10 mg dose of amlodipine  if diastolic pressure is greater than 90. - Administer half dose (5 mg) of amlodipine  if diastolic pressure is between 60 and 90. - Withhold amlodipine  if diastolic pressure is under 60. - Ensure adequate fluid intake.  Chronic kidney disease, stage  3 Chronic kidney disease stage 3 with well-managed kidney function. No lab changes indicating deterioration.      There are no discontinued medications.  Return in about 1 week (around 07/28/2023) for respiratory infection and blood pressure.        Subjective:   Encounter date: 07/21/2023  David [name] is a 89 y.o. male who has Rectus diastasis; Community acquired pneumonia of left lower lobe of lung; Acute hypoxic respiratory failure (HCC); Pleural effusion; Stage 3a chronic kidney disease (CKD) (HCC); HTN (hypertension); Normocytic anemia; Recurrent pneumonia; Empyema (HCC); Protein-calorie malnutrition, severe; Dysphagia; Stenosis of right carotid artery; Proteinuria; Chronic fatigue; Spinal stenosis of lumbar region without neurogenic claudication; Bradycardia; Mixed hyperlipidemia; Benign prostatic hyperplasia with lower urinary tract symptoms; Memory loss; BBB (bundle branch block); Vitamin D  deficiency; History of colon cancer; Solar lentigo; Feels cold; History of pneumonia; Pulmonary nodule; Stage 3b chronic kidney disease (HCC); 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   He  has a past medical history of Acute hypoxic respiratory failure (HCC) (02/26/2022), BBB (bundle branch block) (11/12/2022), Benign prostatic hyperplasia with lower urinary tract symptoms (11/12/2022), Bradycardia (11/12/2022), Chronic fatigue (11/12/2022), Community acquired pneumonia of left lower lobe of lung (02/26/2022), Dysphagia (05/08/2022), Empyema (HCC) (02/28/2022), Feels cold (01/14/2023), History of colon cancer (12/17/2022), History of pneumonia (01/14/2023), HTN (hypertension) (02/26/2022), Hypertension, Memory loss (11/12/2022), Mixed hyperlipidemia (11/12/2022), Normocytic anemia (02/26/2022), Pleural effusion (02/26/2022), Protein-calorie malnutrition, severe (03/02/2022), Proteinuria (11/12/2022), Pulmonary nodule (01/14/2023),  Rectus diastasis (01/04/2012), Recurrent pneumonia (02/27/2022), Solar lentigo (01/14/2023), Spinal stenosis of lumbar region without neurogenic claudication (11/12/2022), Stage 3a chronic kidney disease (CKD) (HCC) (02/26/2022), Stenosis of right carotid artery (11/12/2022), and Vitamin D  deficiency (12/17/2022).[name]   He presents with chief complaint of Pneumonia (1 day follow up URI and CT results. Pt stated cough is still present no  changes or becoming worse stable. ) .   Discussed the use of AI scribe software for clinical note transcription with the patient, who gave verbal consent to proceed.  History of Present Illness David [name] is an 89 year old male with a history of respiratory infection who presents for follow-up after a CT scan showed unusual findings.  He feels 'pretty good' overall, with his appetite returning yesterday after a four-day period of not eating. He continues to experience coughing in spurts, with two episodes last night. The cough is less severe than before and is accompanied by wheezing and shortness of breath. No chest pain, fever, or chills. His sputum is described as sticky and white, resembling 'buttermilk' or 'glue'.  He has a history of low blood pressure, with today's reading at 154/59. He takes spironolactone , olmesartan , hydrochlorothiazide, and amlodipine  for blood pressure management. He uses hydralazine  as needed when his blood pressure is high, taking half a tablet as advised, but has not taken it today. He closely monitors his blood pressure, especially the diastolic number, which tends to run low.  He has been treated with doxycycline, azithromycin , and prednisone  for his respiratory condition. He uses an albuterol  inhaler twice a day but is unsure of its effectiveness. He also uses promethazine  DM syrup for symptom management. He has a history of smoking, which may contribute to his respiratory issues.  Recent lab work showed stable kidney function at CKD  stage 3, normal blood counts, and a C-reactive protein level under 1. A BNP test for heart failure was negative, and an EKG showed no changes. A CT scan revealed nodular clustering in the right middle lobe, which was also present last year.       Past Surgical History:  Procedure Laterality Date   COLON SURGERY     KNEE SURGERY  2009 or 2010   replaced knee cap   PROSTATE SURGERY  2012    Outpatient Medications Prior to Visit  Medication Sig Dispense Refill   albuterol  (VENTOLIN  HFA) 108 (90 Base) MCG/ACT inhaler Inhale 2 puffs into the lungs.     Amino Acids (AMINO ACID PO) Take 5 tablets by mouth daily.     amLODipine  (NORVASC ) 10 MG tablet Take 1 tablet (10 mg total) by mouth at bedtime. 90 tablet 0   Ascorbic Acid (VITAMIN C) 1000 MG tablet Take 1,000 mg by mouth daily.     aspirin  EC 81 MG tablet Take 81 mg by mouth in the morning. Swallow whole.     B Complex Vitamins (VITAMIN B-COMPLEX) TABS Take 1 tablet by mouth every morning.     Cholecalciferol (VITAMIN D3) 50 MCG (2000 UT) TABS Take 2,000 Units by mouth daily.     Coenzyme Q10 (CO Q10 PO) Take 30 mLs by mouth daily.     Cyanocobalamin (VITAMIN B-12) 2500 MCG SUBL Place 2,500 mcg under the tongue daily.     dextromethorphan-guaiFENesin  (MUCINEX  DM) 30-600 MG 12hr tablet Take 1 tablet by mouth 2 (two) times daily for 7 days. 14 tablet 0   doxycycline (VIBRA-TABS) 100 MG tablet Take 100 mg by mouth.     finasteride (PROSCAR) 5 MG tablet Take 5 mg by mouth daily.     GARLIC PO Take by mouth.     hydrALAZINE  (APRESOLINE ) 10 MG tablet Take 0.5 tablets (5 mg total) by mouth 3 (three) times daily as needed (Blood pressure > 140/>90). 90 tablet 0   MAGNESIUM GLYCINATE PO Take 240 mg by mouth daily.  olmesartan -hydrochlorothiazide (BENICAR  HCT) 40-25 MG tablet Take 1 tablet by mouth daily. 90 tablet 1   Omega-3 Fatty Acids (FISH OIL PO) Take 1 capsule by mouth daily.     OVER THE COUNTER MEDICATION Joint liquid 2,00  glucosamine, 200 chondrotin     pantoprazole  (PROTONIX ) 40 MG tablet Take 1 tablet (40 mg total) by mouth daily. 90 tablet 1   Probiotic Product (PROBIOTIC PO) Take 1 capsule by mouth daily.     promethazine-dextromethorphan (PROMETHAZINE-DM) 6.25-15 MG/5ML syrup Take 5 mLs by mouth 4 (four) times daily as needed for cough. 118 mL 0   spironolactone  (ALDACTONE ) 25 MG tablet Take 1 tablet (25 mg total) by mouth daily. 90 tablet 0   TURMERIC CURCUMIN PO Take 1 tablet by mouth daily.     Zinc  50 MG TABS Take 50 mg by mouth daily.     No facility-administered medications prior to visit.    Family History  Problem Relation Age of Onset   Cancer Mother        breast   Heart disease Father        heart attack at age 29    Social History   Socioeconomic History   Marital status: Married    Spouse name: Orelia Binet   Number of children: 3   Years of education: 12   Highest education level: Not on file  Occupational History    Comment: retired  Tobacco Use   Smoking status: Former    Current packs/day: 0.00    Average packs/day: 2.0 packs/day for 20.0 years (40.0 ttl pk-yrs)    Types: Cigarettes    Start date: 02/24/1948    Quit date: 02/24/1968    Years since quitting: 55.4    Passive exposure: Never   Smokeless tobacco: Never  Vaping Use   Vaping status: Never Used  Substance and Sexual Activity   Alcohol use: No   Drug use: No   Sexual activity: Not Currently  Other Topics Concern   Not on file  Social History Narrative   Lives at home with spouse   Caffeine use- occass coffee, tea   Social Drivers of Corporate investment banker Strain: Low Risk  (06/14/2023)   Overall Financial Resource Strain (CARDIA)    Difficulty of Paying Living Expenses: Not hard at all  Food Insecurity: Low Risk  (07/18/2023)   Received from [facility]   Hunger Vital Sign    Worried About Running Out of Food in the Last Year: Never true    Ran Out of Food in the Last Year: Never true   Transportation Needs: No Transportation Needs (07/18/2023)   Received from [facility]    In the past 12 months, has lack of reliable transportation kept you from medical appointments, meetings, work or from getting things needed for daily living? : No  Physical Activity: Sufficiently Active (06/14/2023)   Exercise Vital Sign    Days of Exercise per Week: 3 days    Minutes of Exercise per Session: 60 min  Stress: No Stress Concern Present (06/14/2023)   [employer] of Occupational Health - Occupational Stress Questionnaire    Feeling of Stress : Not at all  Social Connections: Moderately Integrated (06/14/2023)   Social Connection and Isolation Panel [NHANES]    Frequency of Communication with Friends and Family: More than three times a week    Frequency of Social Gatherings with Friends and Family: Once a week    Attends Religious Services: More  than 4 times per year    Active Member of Clubs or Organizations: No    Attends Banker Meetings: Never    Marital Status: Married  Catering manager Violence: Not At Risk (06/14/2023)   Humiliation, Afraid, Rape, and Kick questionnaire    Fear of Current or Ex-Partner: No    Emotionally Abused: No    Physically Abused: No    Sexually Abused: No                                                                                                  Objective:  Physical Exam: BP (!) 140/50 (BP Location: Right Arm, Patient Position: Sitting, Cuff Size: Normal) Comment: recheck done manual  Pulse 60   Resp 18   Wt 185 lb 4.8 oz (84.1 kg)   SpO2 95%   BMI 26.21 kg/m   BP Readings from Last 3 Encounters:  07/21/23 (!) 140/50  07/20/23 (!) 147/67  07/15/23 (!) 146/60    Physical Exam  GENERAL: Alert, cooperative, well developed, no acute distress HEENT: Normocephalic, normal oropharynx, moist mucous membranes CHEST: Mild wheezing in right middle and lower lung CARDIOVASCULAR: Normal heart rate and rhythm,  S1 and S2 normal without murmurs ABDOMEN: Soft, non-tender, non-distended, without organomegaly, normal bowel sounds EXTREMITIES: No cyanosis or edema NEUROLOGICAL: Cranial nerves grossly intact, moves all extremities without gross motor or sensory deficit     CT Chest Wo Contrast Result Date: 07/20/2023 CLINICAL DATA:  Dyspnea, chronic, unclear etiology Respiratory illness, nondiagnostic xray EXAM: CT CHEST WITHOUT CONTRAST TECHNIQUE: Multidetector CT imaging of the chest was performed following the standard protocol without IV contrast. RADIATION DOSE REDUCTION: This exam was performed according to the departmental dose-optimization program which includes automated exposure control, adjustment of the mA and/or kV according to patient size and/or use of iterative reconstruction technique. COMPARISON:  09/17/2022 FINDINGS: Cardiovascular: Heart is normal size. Aorta is normal caliber. Diffuse 3 vessel coronary artery disease and aortic atherosclerosis. Mediastinum/Nodes: No mediastinal, hilar, or axillary adenopathy. Trachea and esophagus are unremarkable. Thyroid  unremarkable. Lungs/Pleura: Peribronchial thickening in the lower lobes. Linear areas of scarring or atelectasis in the left lower lobe, linear areas of scarring in the left lower lobe and lingula, stable since prior study. Clustered nodular densities in the right middle lobe. No effusions or pneumothorax. Upper Abdomen: No acute findings Musculoskeletal: Bilateral gynecomastia, left greater than right. IMPRESSION: No acute bony abnormality. Peribronchial thickening suggesting bronchitis, either acute or chronic. Clustered nodular densities in the right middle lobe could reflect small airways disease or also can be seen with mycobacterial avium infection. Diffuse 3 vessel coronary artery disease. Aortic Atherosclerosis (ICD10-I70.0). Electronically Signed   By: Janeece Mechanic M.D.   On: 07/20/2023 17:40    Recent Results (from the past 2160  hours)  Basic Metabolic Panel (BMET)     Status: Abnormal   Collection Time: 07/06/23  8:27 AM  Result Value Ref Range   Sodium 138 135 - 145 mEq/L   Potassium 4.6 3.5 - 5.1 mEq/L   Chloride 106 96 - 112 mEq/L   CO2 25 19 -  32 mEq/L   Glucose, Bld 96 70 - 99 mg/dL   BUN 33 (H) 6 - 23 mg/dL   Creatinine, Ser 6.21 (H) 0.40 - 1.50 mg/dL   GFR 30.86 (L) >57.84 mL/min    Comment: Calculated using the CKD-EPI Creatinine Equation (2021)   Calcium 9.7 8.4 - 10.5 mg/dL  Hepatic function panel     Status: None   Collection Time: 07/06/23  8:27 AM  Result Value Ref Range   Total Bilirubin 0.6 0.2 - 1.2 mg/dL   Bilirubin, Direct 0.1 0.0 - 0.3 mg/dL   Alkaline Phosphatase 63 39 - 117 U/L   AST 17 0 - 37 U/L   ALT 13 0 - 53 U/L   Total Protein 6.6 6.0 - 8.3 g/dL   Albumin 4.4 3.5 - 5.2 g/dL  Lipid Profile     Status: Abnormal   Collection Time: 07/06/23  8:27 AM  Result Value Ref Range   Cholesterol 185 0 - 200 mg/dL    Comment: ATP III Classification       Desirable:  < 200 mg/dL               Borderline High:  200 - 239 mg/dL          High:  > = 696 mg/dL   Triglycerides 295.2 0.0 - 149.0 mg/dL    Comment: Normal:  <841 mg/dLBorderline High:  150 - 199 mg/dL   HDL 32.44 >01.02 mg/dL   VLDL 72.5 0.0 - 36.6 mg/dL   LDL Cholesterol 440 (H) 0 - 99 mg/dL   Total CHOL/HDL Ratio 3     Comment:                Men          Women1/2 Average Risk     3.4          3.3Average Risk          5.0          4.42X Average Risk          9.6          7.13X Average Risk          15.0          11.0                       NonHDL 120.60     Comment: NOTE:  Non-HDL goal should be 30 mg/dL higher than patient's LDL goal (i.e. LDL goal of < 70 mg/dL, would have non-HDL goal of < 100 mg/dL)  POC [id number]     Status: Normal   Collection Time: 07/20/23  9:22 AM  Result Value Ref Range   SARS Coronavirus 2 Ag Negative Negative  POCT Influenza A/B     Status: Normal   Collection Time: 07/20/23  9:22 AM  Result Value  Ref Range   Influenza A, POC Negative Negative   Influenza B, POC Negative Negative  CBC w/Diff     Status: Abnormal   Collection Time: 07/20/23  9:36 AM  Result Value Ref Range   WBC 8.7 4.0 - 10.5 K/uL   RBC 4.70 4.22 - 5.81 Mil/uL   Hemoglobin 13.8 13.0 - 17.0 g/dL   HCT 56.3 87.5 - 64.3 %   MCV 86.6 78.0 - 100.0 fl   MCHC 33.8 30.0 - 36.0 g/dL   RDW 32.9 51.8 - 84.1 %   Platelets 299.0 150.0 - 400.0 K/uL  Neutrophils Relative % 83.5 (H) 43.0 - 77.0 %   Lymphocytes Relative 6.9 Repeated and verified X2. (L) 12.0 - 46.0 %   Monocytes Relative 9.0 3.0 - 12.0 %   Eosinophils Relative 0.3 0.0 - 5.0 %   Basophils Relative 0.3 0.0 - 3.0 %   Neutro Abs 7.2 1.4 - 7.7 K/uL   Lymphs Abs 0.6 (L) 0.7 - 4.0 K/uL   Monocytes Absolute 0.8 0.1 - 1.0 K/uL   Eosinophils Absolute 0.0 0.0 - 0.7 K/uL   Basophils Absolute 0.0 0.0 - 0.1 K/uL  Comp Met (CMET)     Status: Abnormal   Collection Time: 07/20/23  9:36 AM  Result Value Ref Range   Sodium 136 135 - 145 mEq/L   Potassium 4.4 3.5 - 5.1 mEq/L   Chloride 104 96 - 112 mEq/L   CO2 22 19 - 32 mEq/L   Glucose, Bld 95 70 - 99 mg/dL   BUN 40 (H) 6 - 23 mg/dL   Creatinine, Ser 1.61 (H) 0.40 - 1.50 mg/dL   Total Bilirubin 0.6 0.2 - 1.2 mg/dL   Alkaline Phosphatase 53 39 - 117 U/L   AST 14 0 - 37 U/L   ALT 12 0 - 53 U/L   Total Protein 7.0 6.0 - 8.3 g/dL   Albumin 4.4 3.5 - 5.2 g/dL   GFR 09.60 (L) >45.40 mL/min    Comment: Calculated using the CKD-EPI Creatinine Equation (2021)   Calcium 9.7 8.4 - 10.5 mg/dL  C-reactive protein     Status: None   Collection Time: 07/20/23  9:36 AM  Result Value Ref Range   CRP <1.0 0.5 - 20.0 mg/dL  B Nat Peptide     Status: None   Collection Time: 07/20/23  9:36 AM  Result Value Ref Range   Pro B Natriuretic peptide (BNP) 68.0 0.0 - 100.0 pg/mL        Carnell Christian, MD, MS

## 2023-07-21 NOTE — Telephone Encounter (Signed)
 See phone note

## 2023-07-21 NOTE — Telephone Encounter (Addendum)
 Spoke to Pt and relied CT results. Pt stated he feels the same with no improvement, Pt voiced symptoms aren't getting worse but are stable. Pt will maintain upcoming appointment for today follow up visit.   In home blood pressure readings for today 136/71.

## 2023-07-22 NOTE — Telephone Encounter (Signed)
 Noted

## 2023-07-23 LAB — PROCALCITONIN: Procalcitonin: 0.12 ng/mL — ABNORMAL HIGH (ref <0.10)

## 2023-07-27 ENCOUNTER — Encounter: Payer: Self-pay | Admitting: Internal Medicine

## 2023-07-27 ENCOUNTER — Ambulatory Visit: Admitting: Internal Medicine

## 2023-07-27 VITALS — BP 162/64 | HR 58 | Ht 71.0 in | Wt 180.0 lb

## 2023-07-27 DIAGNOSIS — R9389 Abnormal findings on diagnostic imaging of other specified body structures: Secondary | ICD-10-CM

## 2023-07-27 DIAGNOSIS — Z87891 Personal history of nicotine dependence: Secondary | ICD-10-CM | POA: Diagnosis not present

## 2023-07-27 DIAGNOSIS — J189 Pneumonia, unspecified organism: Secondary | ICD-10-CM | POA: Diagnosis not present

## 2023-07-27 DIAGNOSIS — J00 Acute nasopharyngitis [common cold]: Secondary | ICD-10-CM | POA: Diagnosis not present

## 2023-07-27 MED ORDER — LORATADINE 10 MG PO TABS: 10.0000 mg | ORAL_TABLET | Freq: Every day | ORAL | 11 refills | Status: AC

## 2023-07-27 MED ORDER — FLUTICASONE PROPIONATE 50 MCG/ACT NA SUSP: 1.0000 | Freq: Every day | NASAL | 3 refills | Status: DC

## 2023-07-27 NOTE — Progress Notes (Signed)
 David [name]    [id number]    Mar 23, 1933  Primary Care Physician:Catheryn Cluck, MD Date of Appointment: 07/27/2023 Established Patient Visit  Chief complaint:   Chief Complaint  Patient presents with   abnormal ct chest     HPI: David [name] is a 89 y.o. man with history of tobacco use disorder, HTN.  History of pneumonia in 2024 requiring hospitalization. Otherwise no previous pneumonia or childhood respiratory disease.   Interval Updates: Former patient of Dr. Jenny Mohs here for acute visit. For the past two weeks having coughing, white mucus, sometimes brown or orange. Treated with abx by PCP Dr. Hildy Lowers. Received doxycycline and prednisone . Having trouble sleeping at night but is taking promethazine  cough syrup. He does have an albuterol  inhaler which he uses 2-3 times/day. He doesn't feel like it helps very much. He does have a nebulizer machine but doesn't know how to use it.   He was given a nasal spray but doesn't know the name of it. He doesn't think it's helping.   Had CT Chest Jul 20 2023 which showed cluster of nodules concerning for MAI infection. Sent here for acute visit  No fevers chills night sweats or weight loss. Appetite is decreased but keeping down fluids, no nausea, vomiting, diarrhea.    I have reviewed the patient's family social and past medical history and updated as appropriate.   Past Medical History:  Diagnosis Date   Acute hypoxic respiratory failure (HCC) 02/26/2022   BBB (bundle branch block) 11/12/2022   Benign prostatic hyperplasia with lower urinary tract symptoms 11/12/2022   Bradycardia 11/12/2022   Chronic fatigue 11/12/2022   Community acquired pneumonia of left lower lobe of lung 02/26/2022   Dysphagia 05/08/2022   Empyema (HCC) 02/28/2022   Feels cold 01/14/2023   History of colon cancer 12/17/2022   History of pneumonia 01/14/2023   HTN (hypertension) 02/26/2022   Hypertension    Memory loss 11/12/2022   Mixed  hyperlipidemia 11/12/2022   Normocytic anemia 02/26/2022   Pleural effusion 02/26/2022   Protein-calorie malnutrition, severe 03/02/2022   Proteinuria 11/12/2022   Pulmonary nodule 01/14/2023   Rectus diastasis 01/04/2012   Recurrent pneumonia 02/27/2022   Solar lentigo 01/14/2023   Spinal stenosis of lumbar region without neurogenic claudication 11/12/2022   Stage 3a chronic kidney disease (CKD) (HCC) 02/26/2022   Stenosis of right carotid artery 11/12/2022   Vitamin D  deficiency 12/17/2022    Past Surgical History:  Procedure Laterality Date   COLON SURGERY     KNEE SURGERY  2009 or 2010   replaced knee cap   PROSTATE SURGERY  2012    Family History  Problem Relation Age of Onset   Cancer Mother        breast   Heart disease Father        heart attack at age 60    Social History   Occupational History    Comment: retired  Tobacco Use   Smoking status: Former    Current packs/day: 0.00    Average packs/day: 2.0 packs/day for 20.0 years (40.0 ttl pk-yrs)    Types: Cigarettes    Start date: 02/24/1948    Quit date: 02/24/1968    Years since quitting: 55.4    Passive exposure: Never   Smokeless tobacco: Never  Vaping Use   Vaping status: Never Used  Substance and Sexual Activity   Alcohol use: No   Drug use: No   Sexual activity:  Not Currently     Physical Exam: Blood pressure (!) 162/64, pulse (!) 58, height 5\' 11"  (1.803 m), weight 180 lb (81.6 kg), SpO2 97%.  Gen:      No acute distress, flat affect, hard of hearing ENT:  no nasal polyps, mucus membranes moist Lungs:    No increased respiratory effort, symmetric chest wall excursion, clear to auscultation bilaterally, no wheezes or crackles CV:         Regular rate and rhythm; no murmurs, rubs, or gallops.  No pedal edema   Data Reviewed: Imaging: I have personally reviewed the CT Chest May 2025 shows bilateral mucus plugging lower lobes  PFTs:   Labs: Lab Results  Component Value Date   NA 136  07/20/2023   K 4.4 07/20/2023   CO2 22 07/20/2023   GLUCOSE 95 07/20/2023   BUN 40 (H) 07/20/2023   CREATININE 1.77 (H) 07/20/2023   CALCIUM 9.7 07/20/2023   GFR 33.93 (L) 07/20/2023   GFRNONAA >60 03/10/2022   Lab Results  Component Value Date   WBC 8.7 07/20/2023   HGB 13.8 07/20/2023   HCT 40.7 07/20/2023   MCV 86.6 07/20/2023   PLT 299.0 07/20/2023    Immunization status: Immunization History  Administered Date(s) Administered   Hep A / Hep B 02/20/2014, 03/22/2014   Hep A, Unspecified 05/28/2006, 02/20/2014, 03/22/2014   Hep B, Unspecified 02/20/2014, 03/22/2014   Hepatitis A, Ped/Adol-2 Dose 05/28/2006, 02/20/2014, 03/22/2014   Hepatitis B, PED/ADOLESCENT 02/20/2014, 03/22/2014   Influenza-Unspecified 12/21/2003, 01/02/2005, 01/07/2007, 12/10/2016   Janssen (J&J) SARS-COV-2 Vaccination 08/01/2019   Moderna Sars-Covid-2 Vaccination 05/09/2019, 06/07/2019   PNEUMOCOCCAL CONJUGATE-20 04/06/2022   Pneumococcal Conjugate-13 01/09/2014   Pneumococcal Polysaccharide-23 05/07/2015   Td (Adult),5 Lf Tetanus Toxid, Preservative Free 10/21/2004   Tdap 05/28/2006, 02/20/2014   Typhoid Live 02/20/2014   Yellow Fever 03/22/2014   Zoster Recombinant(Shingrix) 10/09/2016, 12/10/2016    External Records Personally Reviewed: pulmonary  Assessment:  Abnormal CT Chest History of Tobacco use 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  Most of these should go away with the antibiotics you are just prescribed by Dr. Hildy Lowers.  There is a chance it could be an atypical bacterial infection called Mycobacterium.  Patients that are older, and have a history of smoking are more at risk for developing these infections.  At this time you do not have any signs or symptoms of chronic respiratory illness so I think this is much less likely.  I recommend repeating a CT scan in 8 weeks to see if this  pneumonia has cleared up.  In the meantime if you are able to bring the sputum culture please do so with the instructions below.  I need two samples.  There is already a CT scan ordered for end of July so you should keep that appointment.  You can use the albuterol  inhaler as needed for symptoms of chest pain, chest tightness, wheezing, shortness of breath.  The albuterol  inhaler is not going to help with symptoms of sinus congestion and nasal drainage.  Most of your cough is related to sinus congestion and nasal drainage from the recent infection.  I recommend taking nasal saline spray over-the-counter as needed during the day.  You can take this multiple times a day.  I also recommend starting Flonase  nasal spray 1 spray each side your nose twice daily.  You can also take an antihistamine like Claritin  or Allegra to help dry up the drainage.  This will help with coughing.  You can continue the promethazine  cough syrup as well.  I spent 40 minutes on 07/27/2023 in care of this patient including face to face time and non-face to face time spent charting, review of outside records, and coordination of care.  Return to Care: Return in about 8 weeks (around 09/21/2023).   Louie Rover, MD Pulmonary and Critical Care Medicine [facility] [phone]

## 2023-07-27 NOTE — Patient Instructions (Addendum)
 It was a pleasure to see you today!  Please schedule follow up with myself in 2 months.  If my schedule is not open yet, we will contact you with a reminder closer to that time. Please call [phone] if you haven't heard from us  a month before, and always call us  sooner if issues or concerns arise. You can also send us  a message through MyChart, but but aware that this is not to be used for urgent issues and it may take up to 5-7 days to receive a reply. Please be aware that you will likely be able to view your results before I have a chance to respond to them. Please give us  5 business days to respond to any non-urgent results.   Your CT scan showed some changes which could be consistent with a regular pneumonia.  Most of these should go away with the antibiotics you are just prescribed by Dr. Hildy Lowers.  There is a chance it could be an atypical bacterial infection called Mycobacterium.  Patients that are older, and have a history of smoking are more at risk for developing these infections.  At this time you do not have any signs or symptoms of chronic respiratory illness so I think this is much less likely.  I recommend repeating a CT scan in 8 weeks to see if this pneumonia has cleared up.  In the meantime if you are able to bring the sputum culture please do so with the instructions below.  I need two samples.  There is already a CT scan ordered for end of July so you should keep that appointment.  You can use the albuterol  inhaler as needed for symptoms of chest pain, chest tightness, wheezing, shortness of breath.  The albuterol  inhaler is not going to help with symptoms of sinus congestion and nasal drainage.  Most of your cough is related to sinus congestion and nasal drainage from the recent infection.  I recommend taking nasal saline spray over-the-counter as needed during the day.  You can take this multiple times a day.  I also recommend starting Flonase  nasal spray 1 spray each side your  nose twice daily.  You can also take an antihistamine like Claritin or Allegra to help dry up the drainage.  This will help with coughing.  You can continue the promethazine  cough syrup as well.  Flonase  - 1 spray on each side of your nose twice a day for first week, then 1 spray on each side.   Instructions for use: If you also use a saline nasal spray or rinse, use that first. Position the head with the chin slightly tucked. Use the right hand to spray into the left nostril and the right hand to spray into the left nostril.   Point the bottle away from the septum of your nose (cartilage that divides the two sides of your nose).  Hold the nostril closed on the opposite side from where you will spray Spray once and gently sniff to pull the medicine into the higher parts of your nose.  Don't sniff too hard as the medicine will drain down the back of your throat instead. Repeat with a second spray on the same side if prescribed. Repeat on the other side of your nose.  Instructions for getting a sputum sample:  Collect as soon as you wake up in the morning Do not eat or drink anything before collecting sample Only one sample per day per cup. Collect each sample in its  own separate cups Only use cup care provider provided for you Do not use own plastic home containers Wash your hands before opening collection cup Do not take off lip until you are ready to place sample in cup  How to collect sputum sample 1. Loosen sputum Take 3 slow deep breath, so you can feel it from your stomach If you have a vest you may use this device to help break up sputum  2. Collect sample Wash your hands Take lid off Press rim of cup against lower lip  Take deep breath and hope for 2-3 seconds, then cough deeply by using stomach muscles to force out sputum. Make sure if comes from your stomach not your  not your throat or chest. GOAL: collect 3-20mL of sputum try to avoid saliva or spit  3. Refrigerate  sample Close container  Place date on label provided for you - should be in bag Put label on cup Put cup in bag provided to your from office Place in refrigerator right away. DO NOT FREEZE IT.  Bring your sample to the [address] location, unless instructed otherwise.   Reminders Patient should have 2 cups if collecting more than one order Obtain sample in the morning Return to our office within 4 hours of producing sample Produce enough sputum to reach the line marked by clinical staff  Make sure it is sputum and not spit  Any questions call the nearest office [place]- [phone] [place]- [phone] [place]- [phone]

## 2023-07-28 ENCOUNTER — Ambulatory Visit: Payer: Self-pay

## 2023-07-28 NOTE — Telephone Encounter (Signed)
 Noted OV scheduled for 07/29/2023

## 2023-07-28 NOTE — Telephone Encounter (Signed)
  FYI Only or Action Required?: FYI only for provider  Patient was last seen in primary care on 07/21/2023 by Catheryn Cluck, MD. Called Nurse Triage reporting Blood pressure concerns. Symptoms began several days ago. Interventions attempted: Prescription medications: See Med list. Symptoms are: unchanged.  Triage Disposition: See Physician Within 24 Hours- Already scheduled 07/29/23   Patient/caregiver understands and will follow disposition?: Yes     Copied from CRM [id number]. Topic: Clinical - Red Word Triage >> Jul 28, 2023  1:08 PM David [name] wrote: Red Word that prompted transfer to Nurse Triage: BP is 92/36 Reason for Disposition  [1] Systolic BP 90-110 AND [2] taking blood pressure medications AND [3] NOT dizzy, lightheaded or weak  Answer Assessment - Initial Assessment Questions 1. BLOOD PRESSURE: "What is the blood pressure?" "Did you take at least two measurements 5 minutes apart?"     ------ 92/36 ( few minutes before call)  --------  99/37  ( taken during the call)    2. ONSET: "When did you take your blood pressure?"     ---------------- Today    3. HOW: "How did you obtain the blood pressure?" (e.g., visiting nurse, automatic home BP monitor)     --------------- At home BP monitor    4. HISTORY: "Do you have a history of low blood pressure?" "What is your blood pressure normally?"      ------------------Denies     5. MEDICINES: "Are you taking any medications for blood pressure?" If Yes, ask: "Have they been changed recently?"     --------- Hydralazine , Tanztoprazole, Almasartan,   6. PULSE RATE: "Do you know what your pulse rate is?"      ---- 54 HR   7. OTHER SYMPTOMS: "Have you been sick recently?" "Have you had a recent injury?"     ------------ Vision briefly got blurry. Dissipated now.    Additional Information: --Pt already has an appt with PCP tomorrow. Per wife, patient was already instructed to stop the Hydralazine  if the diastolic <60. Pt is  keeping original appt 07/29/23.  Protocols used: Blood Pressure - Low-A-AH

## 2023-07-29 ENCOUNTER — Ambulatory Visit (INDEPENDENT_AMBULATORY_CARE_PROVIDER_SITE_OTHER): Admitting: Family Medicine

## 2023-07-29 ENCOUNTER — Ambulatory Visit: Admitting: Family Medicine

## 2023-07-29 VITALS — BP 118/52 | HR 50 | Temp 98.2°F | Ht 71.0 in | Wt 181.1 lb

## 2023-07-29 DIAGNOSIS — I1A0 Resistant hypertension: Secondary | ICD-10-CM

## 2023-07-29 DIAGNOSIS — J189 Pneumonia, unspecified organism: Secondary | ICD-10-CM | POA: Diagnosis not present

## 2023-07-29 MED ORDER — FLUTICASONE PROPIONATE 50 MCG/ACT NA SUSP: 1.0000 | Freq: Every day | NASAL | 3 refills | Status: DC

## 2023-07-29 NOTE — Progress Notes (Signed)
 Assessment & Plan   Assessment/Plan:     Assessment & Plan Hypertension Resistant and labile hypertension with wide pulse pressure due to high systolic and low diastolic pressures. Current medications include amlodipine , olmesartan  hydrochlorothiazide, and spironolactone . Blood pressure readings have been variable, with recent low diastolic pressures. Spironolactone  is suspected to contribute to low diastolic pressures. The decision was made to stop spironolactone  to prevent dangerously low diastolic pressures, prioritizing maintaining diastolic above 60. Referral to the hypertension clinic is planned for specialized management due to the complexity of his condition. - Discontinue spironolactone  - Continue amlodipine  and olmesartan  hydrochlorothiazide - Refer to hypertension clinic for specialized management - Monitor blood pressure, targeting diastolic above 60 and systolic below 170 - Consider restarting hydralazine  if systolic exceeds 595  Recurrent Pneumonia Recent episode of pneumonia treated with doxycycline and prednisone . Pulmonology evaluation ruled out mycobacterium avium complex. Residual fluid in lungs noted, likely from pneumonia. Follow-up CT scan scheduled to assess resolution. Monitoring of respiratory symptoms is ongoing to ensure recovery. - Monitor respiratory symptoms - Repeat CT scan at the end of July to assess lung status      Medications Discontinued During This Encounter  Medication Reason   hydrALAZINE  (APRESOLINE ) 10 MG tablet    spironolactone  (ALDACTONE ) 25 MG tablet    fluticasone  (FLONASE ) 50 MCG/ACT nasal spray Reorder    Return in about 1 month (around 08/28/2023) for BP.        Subjective:   Encounter date: 07/29/2023  David [name] is a 89 y.o. male who has Rectus diastasis; Community acquired pneumonia of left lower lobe of lung; Acute hypoxic respiratory failure (HCC); Pleural effusion; Stage 3a chronic kidney disease (CKD) (HCC); HTN  (hypertension); Normocytic anemia; Recurrent pneumonia; Empyema (HCC); Protein-calorie malnutrition, severe; Dysphagia; Stenosis of right carotid artery; Proteinuria; Chronic fatigue; Spinal stenosis of lumbar region without neurogenic claudication; Bradycardia; Mixed hyperlipidemia; Benign prostatic hyperplasia with lower urinary tract symptoms; Memory loss; BBB (bundle branch block); Vitamin D  deficiency; History of colon cancer; Solar lentigo; Feels cold; History of pneumonia; Pulmonary nodule; Stage 3b chronic kidney disease (HCC); 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   He  has a past medical history of Acute hypoxic respiratory failure (HCC) (02/26/2022), BBB (bundle branch block) (11/12/2022), Benign prostatic hyperplasia with lower urinary tract symptoms (11/12/2022), Bradycardia (11/12/2022), Chronic fatigue (11/12/2022), Community acquired pneumonia of left lower lobe of lung (02/26/2022), Dysphagia (05/08/2022), Empyema (HCC) (02/28/2022), Feels cold (01/14/2023), History of colon cancer (12/17/2022), History of pneumonia (01/14/2023), HTN (hypertension) (02/26/2022), Hypertension, Memory loss (11/12/2022), Mixed hyperlipidemia (11/12/2022), Normocytic anemia (02/26/2022), Pleural effusion (02/26/2022), Protein-calorie malnutrition, severe (03/02/2022), Proteinuria (11/12/2022), Pulmonary nodule (01/14/2023), Rectus diastasis (01/04/2012), Recurrent pneumonia (02/27/2022), Solar lentigo (01/14/2023), Spinal stenosis of lumbar region without neurogenic claudication (11/12/2022), Stage 3a chronic kidney disease (CKD) (HCC) (02/26/2022), Stenosis of right carotid artery (11/12/2022), and Vitamin D  deficiency (12/17/2022).Dmario Russom Aas   He presents with chief complaint of Medical Management of Chronic Issues (Follow on BP reading, diastolic BP reading low, refill of nasal spray and cough med) .   Discussed the use of AI scribe software for  clinical note transcription with the patient, who gave verbal consent to proceed.  History of Present Illness David [name] is an 89 year old male with resistant and labile hypertension who presents for follow-up on blood pressure and recent recurrent pneumonia.  He has a history of resistant and labile hypertension, currently managed with amlodipine  10 mg daily, olmesartan  hydrochlorothiazide 40/25 mg daily,  and spironolactone  25 mg daily. He previously used hydralazine  5 mg TID PRN for blood pressure greater than 140/90 but has stopped due to low diastolic readings. His blood pressure readings have been variable, with a recent low of 118/50 and a high of 160/76. His blood pressure dropped significantly while working outside, prompting frequent monitoring and medication adjustments.  He was recently treated for recurrent pneumonia with doxycycline and prednisone . He was evaluated by pulmonology on July 27, 2023, for concerns of possible mycobacterium avium complex, but this was deemed unlikely. A follow-up CT scan is scheduled for the end of July to monitor residual fluid in the lungs. His breathing is 'pretty all right' with some residual cough, indicating improvement from the pneumonia. No significant breathing difficulties currently.     ROS  Past Surgical History:  Procedure Laterality Date   COLON SURGERY     KNEE SURGERY  2009 or 2010   replaced knee cap   PROSTATE SURGERY  2012    Outpatient Medications Prior to Visit  Medication Sig Dispense Refill   albuterol  (VENTOLIN  HFA) 108 (90 Base) MCG/ACT inhaler Inhale 2 puffs into the lungs.     Amino Acids (AMINO ACID PO) Take 5 tablets by mouth daily.     amLODipine  (NORVASC ) 10 MG tablet Take 1 tablet (10 mg total) by mouth at bedtime. 90 tablet 0   Ascorbic Acid (VITAMIN C) 1000 MG tablet Take 1,000 mg by mouth daily.     aspirin  EC 81 MG tablet Take 81 mg by mouth in the morning. Swallow whole.     B Complex Vitamins (VITAMIN  B-COMPLEX) TABS Take 1 tablet by mouth every morning.     Cholecalciferol (VITAMIN D3) 50 MCG (2000 UT) TABS Take 2,000 Units by mouth daily.     Coenzyme Q10 (CO Q10 PO) Take 30 mLs by mouth daily.     Cyanocobalamin (VITAMIN B-12) 2500 MCG SUBL Place 2,500 mcg under the tongue daily.     finasteride (PROSCAR) 5 MG tablet Take 5 mg by mouth daily.     GARLIC PO Take by mouth.     loratadine (CLARITIN) 10 MG tablet Take 1 tablet (10 mg total) by mouth daily. 30 tablet 11   MAGNESIUM GLYCINATE PO Take 240 mg by mouth daily.     olmesartan -hydrochlorothiazide (BENICAR  HCT) 40-25 MG tablet Take 1 tablet by mouth daily. 90 tablet 1   Omega-3 Fatty Acids (FISH OIL PO) Take 1 capsule by mouth daily.     OVER THE COUNTER MEDICATION Joint liquid 2,00 glucosamine, 200 chondrotin     pantoprazole  (PROTONIX ) 40 MG tablet Take 1 tablet (40 mg total) by mouth daily. 90 tablet 1   Probiotic Product (PROBIOTIC PO) Take 1 capsule by mouth daily.     promethazine -dextromethorphan (PROMETHAZINE -DM) 6.25-15 MG/5ML syrup Take 5 mLs by mouth 4 (four) times daily as needed for cough. 118 mL 0   TURMERIC CURCUMIN PO Take 1 tablet by mouth daily.     Zinc  50 MG TABS Take 50 mg by mouth daily.     fluticasone  (FLONASE ) 50 MCG/ACT nasal spray Place 1 spray into both nostrils daily. 16 g 3   spironolactone  (ALDACTONE ) 25 MG tablet Take 1 tablet (25 mg total) by mouth daily. 90 tablet 0   hydrALAZINE  (APRESOLINE ) 10 MG tablet Take 0.5 tablets (5 mg total) by mouth 3 (three) times daily as needed (Blood pressure > 140/>90). (Patient not taking: Reported on 07/29/2023) 90 tablet 0   No facility-administered medications  prior to visit.    Family History  Problem Relation Age of Onset   Cancer Mother        breast   Heart disease Father        heart attack at age 5    Social History   Socioeconomic History   Marital status: Married    Spouse name: Orelia Binet   Number of children: 3   Years of education: 12    Highest education level: Not on file  Occupational History    Comment: retired  Tobacco Use   Smoking status: Former    Current packs/day: 0.00    Average packs/day: 2.0 packs/day for 20.0 years (40.0 ttl pk-yrs)    Types: Cigarettes    Start date: 02/24/1948    Quit date: 02/24/1968    Years since quitting: 55.4    Passive exposure: Never   Smokeless tobacco: Never  Vaping Use   Vaping status: Never Used  Substance and Sexual Activity   Alcohol use: No   Drug use: No   Sexual activity: Not Currently  Other Topics Concern   Not on file  Social History Narrative   Lives at home with spouse   Caffeine use- occass coffee, tea   Social Drivers of Corporate investment banker Strain: Low Risk  (06/14/2023)   Overall Financial Resource Strain (CARDIA)    Difficulty of Paying Living Expenses: Not hard at all  Food Insecurity: Low Risk  (07/18/2023)   Received from [facility]   Hunger Vital Sign    Worried About Running Out of Food in the Last Year: Never true    Ran Out of Food in the Last Year: Never true  Transportation Needs: No Transportation Needs (07/18/2023)   Received from [facility]    In the past 12 months, has lack of reliable transportation kept you from medical appointments, meetings, work or from getting things needed for daily living? : No  Physical Activity: Sufficiently Active (06/14/2023)   Exercise Vital Sign    Days of Exercise per Week: 3 days    Minutes of Exercise per Session: 60 min  Stress: No Stress Concern Present (06/14/2023)   [employer] of Occupational Health - Occupational Stress Questionnaire    Feeling of Stress : Not at all  Social Connections: Moderately Integrated (06/14/2023)   Social Connection and Isolation Panel [NHANES]    Frequency of Communication with Friends and Family: More than three times a week    Frequency of Social Gatherings with Friends and Family: Once a week    Attends Religious Services: More than 4  times per year    Active Member of [employer] or Organizations: No    Attends Banker Meetings: Never    Marital Status: Married  Catering manager Violence: Not At Risk (06/14/2023)   Humiliation, Afraid, Rape, and Kick questionnaire    Fear of Current or Ex-Partner: No    Emotionally Abused: No    Physically Abused: No    Sexually Abused: No  Objective:  Physical Exam: BP (!) 118/52   Pulse (!) 50   Temp 98.2 F (36.8 C) (Temporal)   Ht 5\' 11"  (1.803 m)   Wt 181 lb 2 oz (82.2 kg)   SpO2 96%   BMI 25.26 kg/m    Physical Exam  GENERAL: Alert, cooperative, well developed, no acute distress. HEENT: Normocephalic, normal oropharynx, moist mucous membranes. CHEST: Clear to auscultation bilaterally, no wheezes, rhonchi, or crackles. CARDIOVASCULAR: Normal heart rate and rhythm, S1 and S2 normal without murmurs. ABDOMEN: Soft, non-tender, non-distended, without organomegaly, normal bowel sounds. EXTREMITIES: No cyanosis or edema. NEUROLOGICAL: Cranial nerves grossly intact, moves all extremities without gross motor or sensory deficit.     CT Chest Wo Contrast Result Date: 07/20/2023 CLINICAL DATA:  Dyspnea, chronic, unclear etiology Respiratory illness, nondiagnostic xray EXAM: CT CHEST WITHOUT CONTRAST TECHNIQUE: Multidetector CT imaging of the chest was performed following the standard protocol without IV contrast. RADIATION DOSE REDUCTION: This exam was performed according to the departmental dose-optimization program which includes automated exposure control, adjustment of the mA and/or kV according to patient size and/or use of iterative reconstruction technique. COMPARISON:  09/17/2022 FINDINGS: Cardiovascular: Heart is normal size. Aorta is normal caliber. Diffuse 3 vessel coronary artery disease and aortic atherosclerosis. Mediastinum/Nodes: No mediastinal, hilar, or  axillary adenopathy. Trachea and esophagus are unremarkable. Thyroid  unremarkable. Lungs/Pleura: Peribronchial thickening in the lower lobes. Linear areas of scarring or atelectasis in the left lower lobe, linear areas of scarring in the left lower lobe and lingula, stable since prior study. Clustered nodular densities in the right middle lobe. No effusions or pneumothorax. Upper Abdomen: No acute findings Musculoskeletal: Bilateral gynecomastia, left greater than right. IMPRESSION: No acute bony abnormality. Peribronchial thickening suggesting bronchitis, either acute or chronic. Clustered nodular densities in the right middle lobe could reflect small airways disease or also can be seen with mycobacterial avium infection. Diffuse 3 vessel coronary artery disease. Aortic Atherosclerosis (ICD10-I70.0). Electronically Signed   By: Janeece Mechanic M.D.   On: 07/20/2023 17:40    Recent Results (from the past 2160 hours)  Basic Metabolic Panel (BMET)     Status: Abnormal   Collection Time: 07/06/23  8:27 AM  Result Value Ref Range   Sodium 138 135 - 145 mEq/L   Potassium 4.6 3.5 - 5.1 mEq/L   Chloride 106 96 - 112 mEq/L   CO2 25 19 - 32 mEq/L   Glucose, Bld 96 70 - 99 mg/dL   BUN 33 (H) 6 - 23 mg/dL   Creatinine, Ser 0.98 (H) 0.40 - 1.50 mg/dL   GFR 11.91 (L) >47.82 mL/min    Comment: Calculated using the CKD-EPI Creatinine Equation (2021)   Calcium 9.7 8.4 - 10.5 mg/dL  Hepatic function panel     Status: None   Collection Time: 07/06/23  8:27 AM  Result Value Ref Range   Total Bilirubin 0.6 0.2 - 1.2 mg/dL   Bilirubin, Direct 0.1 0.0 - 0.3 mg/dL   Alkaline Phosphatase 63 39 - 117 U/L   AST 17 0 - 37 U/L   ALT 13 0 - 53 U/L   Total Protein 6.6 6.0 - 8.3 g/dL   Albumin 4.4 3.5 - 5.2 g/dL  Lipid Profile     Status: Abnormal   Collection Time: 07/06/23  8:27 AM  Result Value Ref Range   Cholesterol 185 0 - 200 mg/dL    Comment: ATP III Classification       Desirable:  < 200 mg/dL  Borderline High:  200 - 239 mg/dL          High:  > = 161 mg/dL   Triglycerides 096.0 0.0 - 149.0 mg/dL    Comment: Normal:  <454 mg/dLBorderline High:  150 - 199 mg/dL   HDL 09.81 >19.14 mg/dL   VLDL 78.2 0.0 - 95.6 mg/dL   LDL Cholesterol 213 (H) 0 - 99 mg/dL   Total CHOL/HDL Ratio 3     Comment:                Men          Women1/2 Average Risk     3.4          3.3Average Risk          5.0          4.42X Average Risk          9.6          7.13X Average Risk          15.0          11.0                       NonHDL 120.60     Comment: NOTE:  Non-HDL goal should be 30 mg/dL higher than patient's LDL goal (i.e. LDL goal of < 70 mg/dL, would have non-HDL goal of < 100 mg/dL)  POC [id number]     Status: Normal   Collection Time: 07/20/23  9:22 AM  Result Value Ref Range   SARS Coronavirus 2 Ag Negative Negative  POCT Influenza A/B     Status: Normal   Collection Time: 07/20/23  9:22 AM  Result Value Ref Range   Influenza A, POC Negative Negative   Influenza B, POC Negative Negative  CBC w/Diff     Status: Abnormal   Collection Time: 07/20/23  9:36 AM  Result Value Ref Range   WBC 8.7 4.0 - 10.5 K/uL   RBC 4.70 4.22 - 5.81 Mil/uL   Hemoglobin 13.8 13.0 - 17.0 g/dL   HCT 69.6 29.5 - 28.4 %   MCV 86.6 78.0 - 100.0 fl   MCHC 33.8 30.0 - 36.0 g/dL   RDW 13.2 44.0 - 10.2 %   Platelets 299.0 150.0 - 400.0 K/uL   Neutrophils Relative % 83.5 (H) 43.0 - 77.0 %   Lymphocytes Relative 6.9 Repeated and verified X2. (L) 12.0 - 46.0 %   Monocytes Relative 9.0 3.0 - 12.0 %   Eosinophils Relative 0.3 0.0 - 5.0 %   Basophils Relative 0.3 0.0 - 3.0 %   Neutro Abs 7.2 1.4 - 7.7 K/uL   Lymphs Abs 0.6 (L) 0.7 - 4.0 K/uL   Monocytes Absolute 0.8 0.1 - 1.0 K/uL   Eosinophils Absolute 0.0 0.0 - 0.7 K/uL   Basophils Absolute 0.0 0.0 - 0.1 K/uL  Comp Met (CMET)     Status: Abnormal   Collection Time: 07/20/23  9:36 AM  Result Value Ref Range   Sodium 136 135 - 145 mEq/L   Potassium 4.4 3.5 - 5.1 mEq/L    Chloride 104 96 - 112 mEq/L   CO2 22 19 - 32 mEq/L   Glucose, Bld 95 70 - 99 mg/dL   BUN 40 (H) 6 - 23 mg/dL   Creatinine, Ser 7.25 (H) 0.40 - 1.50 mg/dL   Total Bilirubin 0.6 0.2 - 1.2 mg/dL   Alkaline Phosphatase 53 39 - 117 U/L   AST  14 0 - 37 U/L   ALT 12 0 - 53 U/L   Total Protein 7.0 6.0 - 8.3 g/dL   Albumin 4.4 3.5 - 5.2 g/dL   GFR 09.81 (L) >19.14 mL/min    Comment: Calculated using the CKD-EPI Creatinine Equation (2021)   Calcium 9.7 8.4 - 10.5 mg/dL  C-reactive protein     Status: None   Collection Time: 07/20/23  9:36 AM  Result Value Ref Range   CRP <1.0 0.5 - 20.0 mg/dL  Procalcitonin     Status: Abnormal   Collection Time: 07/20/23  9:36 AM  Result Value Ref Range   Procalcitonin 0.12 (H) <0.10 ng/mL    Comment: . Interpretation Guidelines . Diagnosis of systemic bacterial infection/sepsis <0.50 ng/mL: Low risk for sepsis: local bacterial infection possible >=0.50 to <2.00 ng/mL: Sepsis is possible; other conditions possible >=2.00 to <10.00 ng/mL: Sepsis likely >10.00 ng/mL: Severe bacterial sepsis or septic shock probable . Diagnosis of lower respiratory tract infections: <0.10 ng/mL: Bacterial infection very unlikely >=0.10 to <0.25 ng/mL: Bacterial infection unlikely >=0.25 to <0.50 ng/mL: Bacterial infection likely >=0.50 ng/mL: Bacterial infection very likely . Procalcitonin levels should be evaluated in context of all laboratory findings and the total clinical status of the patient. . For additional information, please refer to http://education.[facility].com/faq/FAQ46 (This link is being provided for informational/educational purposes only.)   B Nat Peptide     Status: None   Collection Time: 07/20/23  9:36 AM  Result Value Ref Range   Pro B Natriuretic peptide (BNP) 68.0 0.0 - 100.0 pg/mL        Carnell Christian, MD, MS

## 2023-07-29 NOTE — Patient Instructions (Signed)
  VISIT SUMMARY: Today, we discussed your blood pressure management and recent pneumonia. Your blood pressure has been variable, and we made some changes to your medications. We also reviewed your recovery from pneumonia and planned a follow-up scan to check your lungs.  YOUR PLAN: -HYPERTENSION: Hypertension means high blood pressure. Your blood pressure has been difficult to control and has shown wide variations. We decided to stop spironolactone  to prevent your diastolic pressure from dropping too low. You should continue taking amlodipine  and olmesartan  hydrochlorothiazide. We will refer you to a hypertension clinic for specialized care. Please monitor your blood pressure regularly, aiming to keep your diastolic pressure above 60 and systolic pressure below 170. If your systolic pressure goes above 170, we may consider restarting hydralazine  5 mg as needed.  -RECURRENT PNEUMONIA: Pneumonia is an infection in your lungs. You were recently treated for pneumonia, and a follow-up CT scan is scheduled for the end of July to check for any remaining fluid in your lungs. Please continue to monitor your breathing and report any new or worsening symptoms.  INSTRUCTIONS: Please follow up with the hypertension clinic for specialized management of your blood pressure. Continue to monitor your blood pressure regularly. Attend the scheduled CT scan at the end of July to check your lung status. Report any new or worsening respiratory symptoms immediately.

## 2023-08-25 ENCOUNTER — Ambulatory Visit (HOSPITAL_BASED_OUTPATIENT_CLINIC_OR_DEPARTMENT_OTHER): Admitting: Family

## 2023-08-25 ENCOUNTER — Encounter (HOSPITAL_BASED_OUTPATIENT_CLINIC_OR_DEPARTMENT_OTHER): Payer: Self-pay | Admitting: Family

## 2023-08-25 VITALS — BP 134/58 | HR 70 | Ht 71.0 in | Wt 191.0 lb

## 2023-08-25 DIAGNOSIS — I6523 Occlusion and stenosis of bilateral carotid arteries: Secondary | ICD-10-CM | POA: Diagnosis not present

## 2023-08-25 DIAGNOSIS — I10 Essential (primary) hypertension: Secondary | ICD-10-CM | POA: Diagnosis not present

## 2023-08-25 DIAGNOSIS — I2510 Atherosclerotic heart disease of native coronary artery without angina pectoris: Secondary | ICD-10-CM | POA: Diagnosis not present

## 2023-08-25 DIAGNOSIS — E785 Hyperlipidemia, unspecified: Secondary | ICD-10-CM

## 2023-08-25 NOTE — Progress Notes (Signed)
 [facility] Assessment:    Date:  08/29/2023   ID:  David [name], DOB 05/14/1933, MRN [id number]  PCP:  Sebastian Beverley NOVAK, MD  Cardiologist:  None  Nephrologist:  Referring MD: Sebastian Beverley NOVAK, MD   CC: Hypertension  History of Present Illness:    David [name] is a 89 y.o. male with a hx of coronary artery calcification on CT scan, aortic atherosclerosis. HTN, CKD3a, HLD, vitamin D  deficiency, colon cancer, spinal stenosis here to follow up in the [facility].   Prior CT abd 2009 with normal adrenals. CT chest 02/26/22 with coronary artery calcification and aortic atherosclerosis. PCP team has previously increased Amlodipine  to 10mg , increased Hydralazine  to 10mg  TID.  Established with [facility] 03/11/23. David [name] was diagnosed with hypertension >10 years ago.  Previous tobacco use having quit in 1970. Exercising at [facility] three times per week though not routinely following a low sodium diet. Losartan-hydrochlorothiazide was transitioned to Olmesartan -hydrochlorothiazide 40-25mg  daily. Carotid and renal duplex ordered.   Presents today for follow up with his wife. He did have carotid duplex 05/2023 at [facility] with right 60-79% stenosis of the internal carotid artery and left  20-39% diameter reduction of the internal carotid artery. Echo 05/2023 at [facility] normal LVEF 60-65%, moderate LAE, mild RAE, mild aortic stenosis, trace AI, mild mR, mild TR. He reports BP has been well controlled at recent clinic visits. Reports no shortness of breath nor dyspnea on exertion. Reports no chest pain, pressure, or tightness. No edema, orthopnea, PND. Reports no palpitations.  He follows with Dr. Prince of [facility] cardi[facility]. As BP controlled, requests to transfer care back to his team.   Previous antihypertensives:   Past Medical History:  Diagnosis Date   Acute hypoxic respiratory failure (HCC) 02/26/2022   BBB (bundle branch block)  11/12/2022   Benign prostatic hyperplasia with lower urinary tract symptoms 11/12/2022   Bradycardia 11/12/2022   Chronic fatigue 11/12/2022   Community acquired pneumonia of left lower lobe of lung 02/26/2022   Dysphagia 05/08/2022   Empyema (HCC) 02/28/2022   Feels cold 01/14/2023   History of colon cancer 12/17/2022   History of pneumonia 01/14/2023   HTN (hypertension) 02/26/2022   Hypertension    Memory loss 11/12/2022   Mixed hyperlipidemia 11/12/2022   Normocytic anemia 02/26/2022   Pleural effusion 02/26/2022   Protein-calorie malnutrition, severe 03/02/2022   Proteinuria 11/12/2022   Pulmonary nodule 01/14/2023   Rectus diastasis 01/04/2012   Recurrent pneumonia 02/27/2022   Solar lentigo 01/14/2023   Spinal stenosis of lumbar region without neurogenic claudication 11/12/2022   Stage 3a chronic kidney disease (CKD) (HCC) 02/26/2022   Stenosis of right carotid artery 11/12/2022   Vitamin D  deficiency 12/17/2022    Past Surgical History:  Procedure Laterality Date   COLON SURGERY     KNEE SURGERY  2009 or 2010   replaced knee cap   PROSTATE SURGERY  2012    Current Medications: Current Meds  Medication Sig   albuterol  (VENTOLIN  HFA) 108 (90 Base) MCG/ACT inhaler Inhale 2 puffs into the lungs.   Amino Acids (AMINO ACID PO) Take 5 tablets by mouth daily.   amLODipine  (NORVASC ) 10 MG tablet Take 1 tablet (10 mg total) by mouth at bedtime.   Ascorbic Acid (VITAMIN C) 1000 MG tablet Take 1,000 mg by mouth daily.   aspirin  EC 81 MG tablet Take 81 mg by mouth in the morning. Swallow whole.   B Complex Vitamins (VITAMIN B-COMPLEX)  TABS Take 1 tablet by mouth every morning.   Cholecalciferol (VITAMIN D3) 50 MCG (2000 UT) TABS Take 2,000 Units by mouth daily.   Coenzyme Q10 (CO Q10 PO) Take 30 mLs by mouth daily.   Cyanocobalamin (VITAMIN B-12) 2500 MCG SUBL Place 2,500 mcg under the tongue daily.   finasteride (PROSCAR) 5 MG tablet Take 5 mg by mouth daily.    fluticasone  (FLONASE ) 50 MCG/ACT nasal spray Place 1 spray into both nostrils daily.   GARLIC PO Take by mouth.   MAGNESIUM GLYCINATE PO Take 240 mg by mouth daily.   olmesartan -hydrochlorothiazide (BENICAR  HCT) 40-25 MG tablet Take 1 tablet by mouth daily.   Omega-3 Fatty Acids (FISH OIL PO) Take 1 capsule by mouth daily.   OVER THE COUNTER MEDICATION Joint liquid 2,00 glucosamine, 200 chondrotin   pantoprazole  (PROTONIX ) 40 MG tablet Take 1 tablet (40 mg total) by mouth daily.   Probiotic Product (PROBIOTIC PO) Take 1 capsule by mouth daily.   promethazine -dextromethorphan (PROMETHAZINE -DM) 6.25-15 MG/5ML syrup Take 5 mLs by mouth 4 (four) times daily as needed for cough.   TURMERIC CURCUMIN PO Take 1 tablet by mouth daily.   Zinc  50 MG TABS Take 50 mg by mouth daily.     Allergies:   Patient has no known allergies.   Social History   Socioeconomic History   Marital status: Married    Spouse name: Elveria   Number of children: 3   Years of education: 12   Highest education level: Not on file  Occupational History    Comment: retired  Tobacco Use   Smoking status: Former    Current packs/day: 0.00    Average packs/day: 2.0 packs/day for 20.0 years (40.0 ttl pk-yrs)    Types: Cigarettes    Start date: 02/24/1948    Quit date: 02/24/1968    Years since quitting: 55.5    Passive exposure: Never   Smokeless tobacco: Never  Vaping Use   Vaping status: Never Used  Substance and Sexual Activity   Alcohol use: No   Drug use: No   Sexual activity: Not Currently  Other Topics Concern   Not on file  Social History Narrative   Lives at home with spouse   Caffeine use- occass coffee, tea   Social Drivers of Corporate investment banker Strain: Low Risk  (06/14/2023)   Overall Financial Resource Strain (CARDIA)    Difficulty of Paying Living Expenses: Not hard at all  Food Insecurity: Low Risk  (07/18/2023)   Received from [facility]   Hunger Vital Sign    Within the past 12  months, you worried that your food would run out before you got money to buy more: Never true    Within the past 12 months, the food you bought just didn't last and you didn't have money to get more. : Never true  Transportation Needs: No Transportation Needs (07/18/2023)   Received from [facility]    In the past 12 months, has lack of reliable transportation kept you from medical appointments, meetings, work or from getting things needed for daily living? : No  Physical Activity: Sufficiently Active (06/14/2023)   Exercise Vital Sign    Days of Exercise per Week: 3 days    Minutes of Exercise per Session: 60 min  Stress: No Stress Concern Present (06/14/2023)   [employer] of Occupational Health - Occupational Stress Questionnaire    Feeling of Stress : Not at all  Social Connections:  Moderately Integrated (06/14/2023)   Social Connection and Isolation Panel    Frequency of Communication with Friends and Family: More than three times a week    Frequency of Social Gatherings with Friends and Family: Once a week    Attends Religious Services: More than 4 times per year    Active Member of [employer] or Organizations: No    Attends Banker Meetings: Never    Marital Status: Married     Family History: The patient's family history includes Cancer in his mother; Heart disease in his father.  ROS:   Please see the history of present illness.     All other systems reviewed and are negative.  EKGs/Labs/Other Studies Reviewed:         Recent Labs: 11/12/2022: TSH 2.13 07/20/2023: ALT 12; BUN 40; Creatinine, Ser 1.77; Hemoglobin 13.8; Platelets 299.0; Potassium 4.4; Pro B Natriuretic peptide (BNP) 68.0; Sodium 136   Recent Lipid Panel    Component Value Date/Time   CHOL 185 07/06/2023 0827   TRIG 105.0 07/06/2023 0827   HDL 64.00 07/06/2023 0827   CHOLHDL 3 07/06/2023 0827   VLDL 21.0 07/06/2023 0827   LDLCALC 100 (H) 07/06/2023 0827    Physical  Exam:   VS:  BP (!) 134/58 (BP Location: Right Arm, Patient Position: Sitting, Cuff Size: Normal)   Pulse 70   Ht 5' 11 (1.803 m)   Wt 191 lb (86.6 kg)   SpO2 99%   BMI 26.64 kg/m  , BMI Body mass index is 26.64 kg/m. GENERAL:  Well appearing HEENT: Pupils equal round and reactive, fundi not visualized, oral mucosa unremarkable NECK:  No jugular venous distention, waveform within normal limits, carotid upstroke brisk and symmetric, no bruits, no thyromegaly LYMPHATICS:  No cervical adenopathy LUNGS:  Clear to auscultation bilaterally HEART:  RRR.  PMI not displaced or sustained,S1 and S2 within normal limits, no S3, no S4, no clicks, no rubs, no murmurs ABD:  Flat, positive bowel sounds normal in frequency in pitch, no bruits, no rebound, no guarding, no midline pulsatile mass, no hepatomegaly, no splenomegaly EXT:  2 plus pulses throughout, no edema, no cyanosis no clubbing SKIN:  No rashes no nodules NEURO:  Cranial nerves II through XII grossly intact, motor grossly intact throughout PSYCH:  Cognitively intact, oriented to person place and time   ASSESSMENT/PLAN:    HTN - BP at goal <130/80 by home readings. Continue Olmesartan -hydrochlorothiazide 40-25mg  daily, amlodipine  10 mg daily, hydralazine  10 mg 3 times daily, spironolactone  25 mg daily.   Plan for renal artery duplex in future if BP persistently difficult to control Discussed to monitor BP at home at least 2 hours after medications and sitting for 5-10 minutes.   Carotid stenosis -  denies amaurosis fugax. Monitor with carotid duplex as clinically indicated.   Bradycardia / RBBB / LAFB - Asymptomatic with no lightheadedness, dizziness. Reports longstanding history of bradycardia. Avoid AV nodal blocking agents. Follows with EP at [facility].  Vitamin D  deficiency - notes ongoing fatigue. Encouraged to discuss with PCP rechecking vitamin D  at upcoming visit  Coronary calcification on CT / HLD, LDL goal <70 - Stable with no  anginal symptoms. No indication for ischemic evaluation.  GDMT includes Aspirin . No longer on Rosuvastatin ,  given presence of coronary calcification and carotid stenosis would be reasonable to resume statin for plaque stabilization benefit even at just 3x per week, he will discuss with PCP.   Screening for Secondary Hypertension:     Relevant  Labs/Studies:    Latest Ref Rng & Units 07/20/2023    9:36 AM 07/06/2023    8:27 AM 12/03/2022    9:39 AM  Basic Labs  Sodium 135 - 145 mEq/L 136  138  139   Potassium 3.5 - 5.1 mEq/L 4.4  4.6  4.5   Creatinine 0.40 - 1.50 mg/dL 8.22  8.31  8.55        Latest Ref Rng & Units 11/12/2022   10:22 AM 03/05/2022   10:25 AM  Thyroid    TSH 0.35 - 5.50 uIU/mL 2.13  3.006                 03/11/2023   11:13 AM  Renovascular   Renal Artery US  Completed Yes       Disposition:    follow up with [facility] PRN     Medication Adjustments/Labs and Tests Ordered: Current medicines are reviewed at length with the patient today.  Concerns regarding medicines are outlined above.  No orders of the defined types were placed in this encounter.  No orders of the defined types were placed in this encounter.    Signed, Reche GORMAN Finder, NP  08/29/2023 2:40 PM    [facility]

## 2023-08-25 NOTE — Patient Instructions (Signed)
 Medication Instructions:  No medication changes today.     Testing/Procedures: Carotid duplex from April showed right 60-79% stenosis and left 20-39% stenosis of your carotid arteries.    Follow-Up: Please follow up as needed   Special Instructions:    Recommend discussing with Dr. Sebastian Possible addition of a low dose statin even just three times per week for plaque stabilization benefit due to carotid stenosis Possible re-check of vitamin D  for energy level

## 2023-08-31 ENCOUNTER — Ambulatory Visit (INDEPENDENT_AMBULATORY_CARE_PROVIDER_SITE_OTHER): Admitting: Family Medicine

## 2023-08-31 ENCOUNTER — Encounter: Payer: Self-pay | Admitting: Family Medicine

## 2023-08-31 VITALS — BP 150/50 | HR 51 | Temp 97.5°F | Ht 71.0 in | Wt 192.2 lb

## 2023-08-31 DIAGNOSIS — E782 Mixed hyperlipidemia: Secondary | ICD-10-CM

## 2023-08-31 DIAGNOSIS — E559 Vitamin D deficiency, unspecified: Secondary | ICD-10-CM

## 2023-08-31 DIAGNOSIS — I454 Nonspecific intraventricular block: Secondary | ICD-10-CM

## 2023-08-31 DIAGNOSIS — M79672 Pain in left foot: Secondary | ICD-10-CM | POA: Insufficient documentation

## 2023-08-31 DIAGNOSIS — I10 Essential (primary) hypertension: Secondary | ICD-10-CM

## 2023-08-31 DIAGNOSIS — N1832 Chronic kidney disease, stage 3b: Secondary | ICD-10-CM | POA: Diagnosis not present

## 2023-08-31 DIAGNOSIS — I2510 Atherosclerotic heart disease of native coronary artery without angina pectoris: Secondary | ICD-10-CM | POA: Insufficient documentation

## 2023-08-31 DIAGNOSIS — N401 Enlarged prostate with lower urinary tract symptoms: Secondary | ICD-10-CM | POA: Diagnosis not present

## 2023-08-31 DIAGNOSIS — D692 Other nonthrombocytopenic purpura: Secondary | ICD-10-CM | POA: Insufficient documentation

## 2023-08-31 DIAGNOSIS — R001 Bradycardia, unspecified: Secondary | ICD-10-CM

## 2023-08-31 DIAGNOSIS — I700 Atherosclerosis of aorta: Secondary | ICD-10-CM | POA: Insufficient documentation

## 2023-08-31 LAB — PSA: PSA: 0.07 ng/mL — ABNORMAL LOW (ref 0.10–4.00)

## 2023-08-31 LAB — TSH: TSH: 2.11 u[IU]/mL (ref 0.35–5.50)

## 2023-08-31 MED ORDER — ROSUVASTATIN CALCIUM 5 MG PO TABS: 5.0000 mg | ORAL_TABLET | Freq: Every day | ORAL | 3 refills | Status: AC

## 2023-08-31 NOTE — Patient Instructions (Addendum)
  VISIT SUMMARY: Today, you came in for a follow-up on your blood pressure and other health concerns. We discussed your labile hypertension, coronary artery disease, aortic atherosclerosis, left foot pain, benign prostatic hyperplasia (BPH), and vitamin D  deficiency. We also reviewed your general health maintenance needs.  YOUR PLAN: -LABILE HYPERTENSION: Labile hypertension means your blood pressure fluctuates significantly. Your current reading is 150/50 mmHg. We will order a renal artery duplex ultrasound to check for any narrowing of the arteries to your kidneys and follow your cardiologist's recommendations for managing your blood pressure.  -CORONARY ARTERY DISEASE: Coronary artery disease involves the narrowing or blockage of the coronary arteries. We recommend starting you on rosuvastatin  5 mg once daily to lower your LDL cholesterol and protect your heart. We will recheck your cholesterol levels in two months with fasting blood work.  -AORTIC ATHEROSCLEROSIS: Aortic atherosclerosis is the buildup of plaque in the aorta. Starting rosuvastatin  will also help manage this condition by reducing cholesterol levels and providing cardiac protection.  -LEFT FOOT PAIN: You have been experiencing pain on the left side of your foot for the past 3-4 weeks, especially when bearing weight. We will order an x-ray to check for fractures or arthritis.  -BENIGN PROSTATIC HYPERPLASIA (BPH): BPH is an enlargement of the prostate gland. You are currently taking finasteride 5 mg. We will recheck your PSA levels to monitor the status of your BPH.  -VITAMIN D  DEFICIENCY: Vitamin D  deficiency means you have lower than normal levels of vitamin D . We will recheck your vitamin D  levels to assess your current status.  -GENERAL HEALTH MAINTENANCE: For your general health, we will recheck your thyroid  function (TSH levels) as it can affect your blood pressure. We also discussed dietary modifications to help manage your  cholesterol levels.  INSTRUCTIONS: - Follow up in two months for fasting cholesterol check - Follow up as needed for left foot pain if not improving or if x-ray is inconclusive - Ensure completion of CT scan for lung evaluation  For foot  xray, go to:    [place] [facility] [address] [place], [place], [place] [address] Phone: [phone]

## 2023-08-31 NOTE — Progress Notes (Signed)
 Are not yet done Assessment & Plan   Assessment/Plan:    Problem List Items Addressed This Visit       Cardiovascular and Mediastinum   HTN (hypertension) - Primary   Relevant Medications   rosuvastatin  (CRESTOR ) 5 MG tablet   Other Relevant Orders   TSH   VAS US  RENAL ARTERY DUPLEX   BBB (bundle branch block)   Relevant Medications   rosuvastatin  (CRESTOR ) 5 MG tablet   Coronary artery disease involving native coronary artery of native heart without angina pectoris   Relevant Medications   rosuvastatin  (CRESTOR ) 5 MG tablet   Senile purpura (HCC)   Relevant Medications   rosuvastatin  (CRESTOR ) 5 MG tablet   Aortic atherosclerosis (HCC)   Relevant Medications   rosuvastatin  (CRESTOR ) 5 MG tablet     Genitourinary   Benign prostatic hyperplasia with lower urinary tract symptoms   Relevant Orders   PSA   Stage 3b chronic kidney disease (HCC)     Other   Bradycardia   Mixed hyperlipidemia   Relevant Medications   rosuvastatin  (CRESTOR ) 5 MG tablet   Vitamin D  deficiency   Relevant Orders   Vitamin D  1,25 dihydroxy   Left foot pain        Assessment and Plan Assessment & Plan Labile Hypertension Labile hypertension with wide pulse pressure, currently 150/50 mmHg. Known bundle branch block. Avoid sinoatrial node pharmacologics due to contraindications. Home blood pressure readings are variable but acceptable per cardiology. No chest pain or significant dyspnea. Renal artery duplex ultrasound recommended to evaluate for renal artery stenosis. - Order renal artery duplex ultrasound to evaluate for renal artery stenosis - Follow cardiology recommendations for blood pressure management  Coronary Artery Disease Coronary artery disease with three-vessel involvement. Statin therapy recommended despite potential dementia risk due to higher cardiac risk. No myalgia with prior statin use. Cardiologist advises low-dose statin to reduce LDL and provide cardiac protection. -  Start rosuvastatin  5 mg once daily - Recheck cholesterol levels in two months with fasting blood work  Aortic Atherosclerosis Aortic atherosclerosis. Statin therapy initiation planned to address coronary artery disease and atherosclerosis, expected to provide cardiac protection and reduce cholesterol levels.  Left Foot Pain Left foot pain for 3-4 weeks, worsens with weight-bearing. No erythema, edema, or ecchymosis. Differential includes fracture or arthritis. X-ray recommended. - Order x-ray of the left foot to evaluate for fractures or arthritis  Benign Prostatic Hyperplasia (BPH) BPH, currently on finasteride 5 mg. No urinary symptoms. PSA levels need rechecking to monitor BPH status. - Recheck PSA levels  Vitamin D  Deficiency Vitamin D  deficiency. No recent vitamin D  levels checked. Rechecking levels to assess current status. - Recheck vitamin D  levels  General Health Maintenance Routine health maintenance includes thyroid  function and cholesterol management. TSH levels need rechecking as he can affect blood pressure. - Recheck TSH levels - Discuss dietary modifications for cholesterol management  Follow-up Follow-up plans for ongoing management and evaluation of conditions. X-ray for left foot pain and CT scan for lung evaluation are pending. - Follow up in two months for fasting cholesterol check - Follow up as needed for left foot pain if not improving or if x-ray is inconclusive - Ensure completion of CT scan for lung evaluation      There are no discontinued medications.  Return in about 2 months (around 11/01/2023) for fasting labs, HLD, BP.        Subjective:   Encounter date: 08/31/2023  David [name] is a 89 y.o. male  who has Rectus diastasis; Community acquired pneumonia of left lower lobe of lung; Acute hypoxic respiratory failure (HCC); Pleural effusion; Stage 3a chronic kidney disease (CKD) (HCC); HTN (hypertension); Normocytic anemia; Recurrent pneumonia;  Empyema (HCC); Protein-calorie malnutrition, severe; Dysphagia; Stenosis of right carotid artery; Proteinuria; Chronic fatigue; Spinal stenosis of lumbar region without neurogenic claudication; Bradycardia; Mixed hyperlipidemia; Benign prostatic hyperplasia with lower urinary tract symptoms; Memory loss; BBB (bundle branch block); Vitamin D  deficiency; History of colon cancer; Solar lentigo; Feels cold; History of pneumonia; Pulmonary nodule; Stage 3b chronic kidney disease (HCC); 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   He  has a past medical history of Acute hypoxic respiratory failure (HCC) (02/26/2022), BBB (bundle branch block) (11/12/2022), Benign prostatic hyperplasia with lower urinary tract symptoms (11/12/2022), Bradycardia (11/12/2022), Chronic fatigue (11/12/2022), Community acquired pneumonia of left lower lobe of lung (02/26/2022), Dysphagia (05/08/2022), Empyema (HCC) (02/28/2022), Feels cold (01/14/2023), History of colon cancer (12/17/2022), History of pneumonia (01/14/2023), HTN (hypertension) (02/26/2022), Hypertension, Memory loss (11/12/2022), Mixed hyperlipidemia (11/12/2022), Normocytic anemia (02/26/2022), Pleural effusion (02/26/2022), Protein-calorie malnutrition, severe (03/02/2022), Proteinuria (11/12/2022), Pulmonary nodule (01/14/2023), Rectus diastasis (01/04/2012), Recurrent pneumonia (02/27/2022), Solar lentigo (01/14/2023), Spinal stenosis of lumbar region without neurogenic claudication (11/12/2022), Stage 3a chronic kidney disease (CKD) (HCC) (02/26/2022), Stenosis of right carotid artery (11/12/2022), and Vitamin D  deficiency (12/17/2022).[name]   He presents with chief complaint of Follow-up (BP; been running high; no energy) .   Discussed the  use of AI scribe software for clinical note transcription with the patient, who gave verbal consent to proceed.  History of Present Illness David [name] is an 89 year old male with labile resistant hypertension and coronary artery disease who presents for blood pressure follow-up.  He has labile resistant hypertension with significant fluctuations in blood pressure readings, often high at home. He is under the care of cardiology and electrophysiology. He has a history of wide pulse pressure and is not on a statin. Current medications include finasteride 5 mg for benign prostatic hyperplasia (BPH).  He has coronary artery disease with three vessels affected and aortic atherosclerosis. His cholesterol levels have been increasing, with a total cholesterol of 185 and LDL of 100 as of May. He previously tried a low dose statin about nine months ago. No current chest pain or shortness of breath, though he has a history of shortness of breath related to smoking.  He has a history of vitamin D  deficiency and is considering rechecking his levels. He also has a history of BPH and is considering rechecking his PSA levels. No urinary symptoms currently.  He has new onset pain on the left side of his foot, near the pinky toe, starting about three to four weeks ago. The pain occurs when bearing weight and is not associated with redness, swelling, or bruising. He has not taken medication for this pain and finds supportive footwear helpful.  He is taking a low dose aspirin  and has concerns about bleeding and bruising.     ROS  Past Surgical History:  Procedure Laterality Date   COLON SURGERY     KNEE SURGERY  2009 or 2010   replaced knee cap   PROSTATE SURGERY  2012    Outpatient Medications Prior to Visit  Medication Sig Dispense Refill   albuterol  (VENTOLIN  HFA) 108 (90 Base) MCG/ACT inhaler Inhale 2  puffs into the lungs.     Amino Acids (AMINO ACID PO) Take 5 tablets by mouth daily.      amLODipine  (NORVASC ) 10 MG tablet Take 1 tablet (10 mg total) by mouth at bedtime. 90 tablet 0   Ascorbic Acid (VITAMIN C) 1000 MG tablet Take 1,000 mg by mouth daily.     aspirin  EC 81 MG tablet Take 81 mg by mouth in the morning. Swallow whole.     B Complex Vitamins (VITAMIN B-COMPLEX) TABS Take 1 tablet by mouth every morning.     Cholecalciferol (VITAMIN D3) 50 MCG (2000 UT) TABS Take 2,000 Units by mouth daily.     Coenzyme Q10 (CO Q10 PO) Take 30 mLs by mouth daily.     Cyanocobalamin (VITAMIN B-12) 2500 MCG SUBL Place 2,500 mcg under the tongue daily.     finasteride (PROSCAR) 5 MG tablet Take 5 mg by mouth daily.     fluticasone  (FLONASE ) 50 MCG/ACT nasal spray Place 1 spray into both nostrils daily. 16 g 3   GARLIC PO Take by mouth.     loratadine  (CLARITIN ) 10 MG tablet Take 1 tablet (10 mg total) by mouth daily. 30 tablet 11   MAGNESIUM GLYCINATE PO Take 240 mg by mouth daily.     olmesartan -hydrochlorothiazide (BENICAR  HCT) 40-25 MG tablet Take 1 tablet by mouth daily. 90 tablet 1   Omega-3 Fatty Acids (FISH OIL PO) Take 1 capsule by mouth daily.     OVER THE COUNTER MEDICATION Joint liquid 2,00 glucosamine, 200 chondrotin     pantoprazole  (PROTONIX ) 40 MG tablet Take 1 tablet (40 mg total) by mouth daily. 90 tablet 1   Probiotic Product (PROBIOTIC PO) Take 1 capsule by mouth daily.     promethazine -dextromethorphan (PROMETHAZINE -DM) 6.25-15 MG/5ML syrup Take 5 mLs by mouth 4 (four) times daily as needed for cough. 118 mL 0   TURMERIC CURCUMIN PO Take 1 tablet by mouth daily.     Zinc  50 MG TABS Take 50 mg by mouth daily.     No facility-administered medications prior to visit.    Family History  Problem Relation Age of Onset   Cancer Mother        breast   Heart disease Father        heart attack at age 81    Social History   Socioeconomic History   Marital status: Married    Spouse name: Elveria   Number of children: 3   Years of education: 12   Highest education  level: Not on file  Occupational History    Comment: retired  Tobacco Use   Smoking status: Former    Current packs/day: 0.00    Average packs/day: 2.0 packs/day for 20.0 years (40.0 ttl pk-yrs)    Types: Cigarettes    Start date: 02/24/1948    Quit date: 02/24/1968    Years since quitting: 55.5    Passive exposure: Never   Smokeless tobacco: Never  Vaping Use   Vaping status: Never Used  Substance and Sexual Activity   Alcohol use: No   Drug use: No   Sexual activity: Not Currently  Other Topics Concern   Not on file  Social History Narrative   Lives at home with spouse   Caffeine use- occass coffee, tea   Social Drivers of Corporate investment banker Strain: Low Risk  (06/14/2023)   Overall Financial Resource Strain (CARDIA)    Difficulty of Paying Living Expenses: Not hard at all  Food Insecurity: Low Risk  (07/18/2023)   Received from [facility]   Hunger Vital Sign    Within the past 12 months, you worried that your food would run out before you got money to buy more: Never true    Within the past 12 months, the food you bought just didn't last and you didn't have money to get more. : Never true  Transportation Needs: No Transportation Needs (07/18/2023)   Received from [facility]    In the past 12 months, has lack of reliable transportation kept you from medical appointments, meetings, work or from getting things needed for daily living? : No  Physical Activity: Sufficiently Active (06/14/2023)   Exercise Vital Sign    Days of Exercise per Week: 3 days    Minutes of Exercise per Session: 60 min  Stress: No Stress Concern Present (06/14/2023)   [employer] of Occupational Health - Occupational Stress Questionnaire    Feeling of Stress : Not at all  Social Connections: Moderately Integrated (06/14/2023)   Social Connection and Isolation Panel    Frequency of Communication with Friends and Family: More than three times a week    Frequency of  Social Gatherings with Friends and Family: Once a week    Attends Religious Services: More than 4 times per year    Active Member of [employer] or Organizations: No    Attends Banker Meetings: Never    Marital Status: Married  Catering manager Violence: Not At Risk (06/14/2023)   Humiliation, Afraid, Rape, and Kick questionnaire    Fear of Current or Ex-Partner: No    Emotionally Abused: No    Physically Abused: No    Sexually Abused: No                                                                                                  Objective:  Physical Exam: BP (!) 150/50   Pulse (!) 51   Temp (!) 97.5 F (36.4 C)   Ht 5' 11 (1.803 m)   Wt 192 lb 3.2 oz (87.2 kg)   SpO2 98%   BMI 26.81 kg/m    Physical Exam VITALS: P- 51, BP- 150/50 GENERAL: Alert, cooperative, well developed, no acute distress HEENT: Normocephalic, normal oropharynx, moist mucous membranes CHEST: Clear to auscultation bilaterally, No wheezes, rhonchi, or crackles CARDIOVASCULAR: Normal heart rate and rhythm, S1 and S2 normal without murmurs ABDOMEN: Soft, non-tender, non-distended, without organomegaly, Normal bowel sounds EXTREMITIES: Mild edema in legs, No cyanosis MUSCULOSKELETAL: Left foot non-tender on palpation NEUROLOGICAL: Cranial nerves grossly intact, Moves all extremities without gross motor or sensory deficit SKIN: Multiple old contusions   Physical Exam  CT Chest Wo Contrast Result Date: 07/20/2023 CLINICAL DATA:  Dyspnea, chronic, unclear etiology Respiratory illness, nondiagnostic xray EXAM: CT CHEST WITHOUT CONTRAST TECHNIQUE: Multidetector CT imaging of the chest was performed following the standard protocol without IV contrast. RADIATION DOSE REDUCTION: This exam was performed according to the departmental dose-optimization program which includes automated exposure control, adjustment of the mA and/or kV according to patient size and/or use  of iterative reconstruction  technique. COMPARISON:  09/17/2022 FINDINGS: Cardiovascular: Heart is normal size. Aorta is normal caliber. Diffuse 3 vessel coronary artery disease and aortic atherosclerosis. Mediastinum/Nodes: No mediastinal, hilar, or axillary adenopathy. Trachea and esophagus are unremarkable. Thyroid  unremarkable. Lungs/Pleura: Peribronchial thickening in the lower lobes. Linear areas of scarring or atelectasis in the left lower lobe, linear areas of scarring in the left lower lobe and lingula, stable since prior study. Clustered nodular densities in the right middle lobe. No effusions or pneumothorax. Upper Abdomen: No acute findings Musculoskeletal: Bilateral gynecomastia, left greater than right. IMPRESSION: No acute bony abnormality. Peribronchial thickening suggesting bronchitis, either acute or chronic. Clustered nodular densities in the right middle lobe could reflect small airways disease or also can be seen with mycobacterial avium infection. Diffuse 3 vessel coronary artery disease. Aortic Atherosclerosis (ICD10-I70.0). Electronically Signed   By: Franky Crease M.D.   On: 07/20/2023 17:40    Recent Results (from the past 2160 hours)  Basic Metabolic Panel (BMET)     Status: Abnormal   Collection Time: 07/06/23  8:27 AM  Result Value Ref Range   Sodium 138 135 - 145 mEq/L   Potassium 4.6 3.5 - 5.1 mEq/L   Chloride 106 96 - 112 mEq/L   CO2 25 19 - 32 mEq/L   Glucose, Bld 96 70 - 99 mg/dL   BUN 33 (H) 6 - 23 mg/dL   Creatinine, Ser 8.31 (H) 0.40 - 1.50 mg/dL   GFR 63.86 (L) >39.99 mL/min    Comment: Calculated using the CKD-EPI Creatinine Equation (2021)   Calcium  9.7 8.4 - 10.5 mg/dL  Hepatic function panel     Status: None   Collection Time: 07/06/23  8:27 AM  Result Value Ref Range   Total Bilirubin 0.6 0.2 - 1.2 mg/dL   Bilirubin, Direct 0.1 0.0 - 0.3 mg/dL   Alkaline Phosphatase 63 39 - 117 U/L   AST 17 0 - 37 U/L   ALT 13 0 - 53 U/L   Total Protein 6.6 6.0 - 8.3 g/dL   Albumin 4.4 3.5 -  5.2 g/dL  Lipid Profile     Status: Abnormal   Collection Time: 07/06/23  8:27 AM  Result Value Ref Range   Cholesterol 185 0 - 200 mg/dL    Comment: ATP III Classification       Desirable:  < 200 mg/dL               Borderline High:  200 - 239 mg/dL          High:  > = 759 mg/dL   Triglycerides 894.9 0.0 - 149.0 mg/dL    Comment: Normal:  <849 mg/dLBorderline High:  150 - 199 mg/dL   HDL 35.99 >60.99 mg/dL   VLDL 78.9 0.0 - 59.9 mg/dL   LDL Cholesterol 899 (H) 0 - 99 mg/dL   Total CHOL/HDL Ratio 3     Comment:                Men          Women1/2 Average Risk     3.4          3.3Average Risk          5.0          4.42X Average Risk          9.6          7.13X Average Risk          15.0  11.0                       NonHDL 120.60     Comment: NOTE:  Non-HDL goal should be 30 mg/dL higher than patient's LDL goal (i.e. LDL goal of < 70 mg/dL, would have non-HDL goal of < 100 mg/dL)  POC RNCPI-80     Status: Normal   Collection Time: 07/20/23  9:22 AM  Result Value Ref Range   SARS Coronavirus 2 Ag Negative Negative  POCT Influenza A/B     Status: Normal   Collection Time: 07/20/23  9:22 AM  Result Value Ref Range   Influenza A, POC Negative Negative   Influenza B, POC Negative Negative  CBC w/Diff     Status: Abnormal   Collection Time: 07/20/23  9:36 AM  Result Value Ref Range   WBC 8.7 4.0 - 10.5 K/uL   RBC 4.70 4.22 - 5.81 Mil/uL   Hemoglobin 13.8 13.0 - 17.0 g/dL   HCT 59.2 60.9 - 47.9 %   MCV 86.6 78.0 - 100.0 fl   MCHC 33.8 30.0 - 36.0 g/dL   RDW 85.2 88.4 - 84.4 %   Platelets 299.0 150.0 - 400.0 K/uL   Neutrophils Relative % 83.5 (H) 43.0 - 77.0 %   Lymphocytes Relative 6.9 Repeated and verified X2. (L) 12.0 - 46.0 %   Monocytes Relative 9.0 3.0 - 12.0 %   Eosinophils Relative 0.3 0.0 - 5.0 %   Basophils Relative 0.3 0.0 - 3.0 %   Neutro Abs 7.2 1.4 - 7.7 K/uL   Lymphs Abs 0.6 (L) 0.7 - 4.0 K/uL   Monocytes Absolute 0.8 0.1 - 1.0 K/uL   Eosinophils Absolute 0.0  0.0 - 0.7 K/uL   Basophils Absolute 0.0 0.0 - 0.1 K/uL  Comp Met (CMET)     Status: Abnormal   Collection Time: 07/20/23  9:36 AM  Result Value Ref Range   Sodium 136 135 - 145 mEq/L   Potassium 4.4 3.5 - 5.1 mEq/L   Chloride 104 96 - 112 mEq/L   CO2 22 19 - 32 mEq/L   Glucose, Bld 95 70 - 99 mg/dL   BUN 40 (H) 6 - 23 mg/dL   Creatinine, Ser 8.22 (H) 0.40 - 1.50 mg/dL   Total Bilirubin 0.6 0.2 - 1.2 mg/dL   Alkaline Phosphatase 53 39 - 117 U/L   AST 14 0 - 37 U/L   ALT 12 0 - 53 U/L   Total Protein 7.0 6.0 - 8.3 g/dL   Albumin 4.4 3.5 - 5.2 g/dL   GFR 66.06 (L) >39.99 mL/min    Comment: Calculated using the CKD-EPI Creatinine Equation (2021)   Calcium  9.7 8.4 - 10.5 mg/dL  C-reactive protein     Status: None   Collection Time: 07/20/23  9:36 AM  Result Value Ref Range   CRP <1.0 0.5 - 20.0 mg/dL  Procalcitonin     Status: Abnormal   Collection Time: 07/20/23  9:36 AM  Result Value Ref Range   Procalcitonin 0.12 (H) <0.10 ng/mL    Comment: . Interpretation Guidelines . Diagnosis of systemic bacterial infection/sepsis <0.50 ng/mL: Low risk for sepsis: local bacterial infection possible >=0.50 to <2.00 ng/mL: Sepsis is possible; other conditions possible >=2.00 to <10.00 ng/mL: Sepsis likely >10.00 ng/mL: Severe bacterial sepsis or septic shock probable . Diagnosis of lower respiratory tract infections: <0.10 ng/mL: Bacterial infection very unlikely >=0.10 to <0.25 ng/mL: Bacterial infection unlikely >=0.25 to <0.50 ng/mL:  Bacterial infection likely >=0.50 ng/mL: Bacterial infection very likely . Procalcitonin levels should be evaluated in context of all laboratory findings and the total clinical status of the patient. . For additional information, please refer to http://education.quest[facility].com/faq/FAQ46 (This link is being provided for informational/educational purposes only.)   B Nat Peptide     Status: None   Collection Time: 07/20/23  9:36 AM  Result  Value Ref Range   Pro B Natriuretic peptide (BNP) 68.0 0.0 - 100.0 pg/mL        Beverley Adine Hummer, MD, MS

## 2023-09-04 LAB — VITAMIN D 1,25 DIHYDROXY
Vitamin D 1, 25 (OH)2 Total: 34 pg/mL (ref 18–72)
Vitamin D2 1, 25 (OH)2: <8 pg/mL
Vitamin D3 1, 25 (OH)2: 34 pg/mL

## 2023-09-05 ENCOUNTER — Other Ambulatory Visit: Payer: Self-pay | Admitting: Family Medicine

## 2023-09-05 DIAGNOSIS — I1A0 Resistant hypertension: Secondary | ICD-10-CM

## 2023-09-06 NOTE — Telephone Encounter (Signed)
 Spoke with patient about refill request Hydralazine . Informed patient on discontinue noticed on 07/29/23 from Dr Sebastian. Confirmed with patient of last appt with Cardio. Informed patient to speak to physician who prescribed medication again. Patient acknowledged understanding.

## 2023-09-07 ENCOUNTER — Other Ambulatory Visit: Payer: Self-pay

## 2023-09-07 ENCOUNTER — Telehealth (HOSPITAL_BASED_OUTPATIENT_CLINIC_OR_DEPARTMENT_OTHER): Payer: Self-pay | Admitting: Family

## 2023-09-07 ENCOUNTER — Ambulatory Visit: Payer: Self-pay | Admitting: Family Medicine

## 2023-09-07 DIAGNOSIS — I10 Essential (primary) hypertension: Secondary | ICD-10-CM

## 2023-09-07 MED ORDER — OLMESARTAN MEDOXOMIL-HCTZ 40-25 MG PO TABS: 1.0000 | ORAL_TABLET | Freq: Every day | ORAL | 3 refills | Status: AC

## 2023-09-07 NOTE — Telephone Encounter (Signed)
*  STAT* If patient is at the pharmacy, call can be transferred to refill team.   1. Which medications need to be refilled? (please list name of each medication and dose if known)   olmesartan -hydrochlorothiazide (BENICAR  HCT) 40-25 MG tablet   2. Would you like to learn more about the convenience, safety, & potential cost savings by using the [facility]?   3. Are you open to using the [facility] (Type [facility]. ).  4. Which pharmacy/location (including street and city if local pharmacy) is medication to be sent to?  [facility] [address] [place], [place] - [address]   5. Do they need a 30 day or 90 day supply? 90 day  Wife Marletta) stated patient is completely out of this medication.

## 2023-09-07 NOTE — Telephone Encounter (Signed)
 Spoke with patient about recent lab results. Pt acknowledged understanding.

## 2023-09-08 ENCOUNTER — Other Ambulatory Visit: Payer: Self-pay | Admitting: Nurse Practitioner

## 2023-09-08 DIAGNOSIS — R0982 Postnasal drip: Secondary | ICD-10-CM

## 2023-09-08 NOTE — Telephone Encounter (Signed)
 Patient would need an office appointment if medication for post nasal drip is still needed

## 2023-09-09 ENCOUNTER — Other Ambulatory Visit: Payer: Self-pay | Admitting: Nephrology

## 2023-09-09 DIAGNOSIS — N1831 Chronic kidney disease, stage 3a: Secondary | ICD-10-CM

## 2023-09-10 ENCOUNTER — Other Ambulatory Visit: Payer: Self-pay | Admitting: Family Medicine

## 2023-09-10 DIAGNOSIS — K219 Gastro-esophageal reflux disease without esophagitis: Secondary | ICD-10-CM

## 2023-09-15 ENCOUNTER — Ambulatory Visit (HOSPITAL_COMMUNITY)
Admission: RE | Admit: 2023-09-15 | Discharge: 2023-09-15 | Disposition: A | Discharge status: Home / Self Care | Source: Ambulatory Visit | Attending: Pulmonary Disease | Admitting: Pulmonary Disease

## 2023-09-15 DIAGNOSIS — R911 Solitary pulmonary nodule: Secondary | ICD-10-CM | POA: Insufficient documentation

## 2023-09-17 ENCOUNTER — Ambulatory Visit (INDEPENDENT_AMBULATORY_CARE_PROVIDER_SITE_OTHER): Admitting: Family Medicine

## 2023-09-17 VITALS — BP 148/60 | HR 65 | Temp 97.9°F | Ht 71.0 in | Wt 194.6 lb

## 2023-09-17 DIAGNOSIS — I1A0 Resistant hypertension: Secondary | ICD-10-CM | POA: Diagnosis not present

## 2023-09-17 MED ORDER — HYDRALAZINE HCL 10 MG PO TABS
ORAL_TABLET | ORAL | 3 refills | Status: AC
Start: 2023-09-17 — End: ?

## 2023-09-17 NOTE — Progress Notes (Signed)
 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 Blood pressure today is 144/60, which is well controlled given his age of 89. Home blood pressures are significantly higher, ranging from 152 to 187 systolic, with a peak of 190 systolic after medication. There is concern about the accuracy of home blood pressure readings. Hydralazine  is being used twice daily, but blood pressure can still reach 190 systolic. Ambulatory blood pressure monitoring is considered to obtain a more accurate assessment. - Refer to cardiology for 24-hour ambulatory blood pressure monitoring - Instruct to bring home blood pressure cuff to next appointment for calibration - Continue hydralazine  10 mg twice daily - Increase hydralazine  to three times daily if blood pressure is greater than 160/100 - Schedule follow-up in two weeks  Hyperlipidemia Currently managed with rosuvastatin . - Continue rosuvastatin  as prescribed      There are no discontinued medications.  Return in about 2 weeks (around 10/01/2023) for BP.        Subjective:   Encounter date: 09/17/2023  TARL CEPHAS is a 89 y.o. male who has Rectus diastasis; Community acquired pneumonia of left lower lobe of lung; Acute hypoxic respiratory failure (HCC); Pleural effusion; Stage 3a chronic kidney disease (CKD) (HCC); HTN (hypertension); Normocytic anemia; Recurrent pneumonia; Empyema (HCC); Protein-calorie malnutrition, severe; Dysphagia; Stenosis of right carotid artery; Proteinuria; Chronic fatigue; Spinal stenosis of lumbar region without neurogenic claudication; Bradycardia; Mixed hyperlipidemia; Benign prostatic hyperplasia with lower urinary tract symptoms; Memory loss; BBB (bundle branch block); Vitamin D  deficiency; History of colon cancer; Solar lentigo; Feels cold; History of pneumonia; Pulmonary nodule; Stage 3b chronic kidney disease (HCC); 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   He  has a past medical history of Acute hypoxic respiratory failure (HCC) (02/26/2022), BBB (bundle branch block) (11/12/2022), Benign prostatic hyperplasia with lower urinary tract symptoms (11/12/2022), Bradycardia (11/12/2022), Chronic fatigue (11/12/2022), Community acquired pneumonia of left lower lobe of lung (02/26/2022), Dysphagia (05/08/2022), Empyema (HCC) (02/28/2022), Feels cold (01/14/2023), History of colon cancer (12/17/2022), History of pneumonia (01/14/2023), HTN (hypertension) (02/26/2022), Hypertension, Memory loss (11/12/2022), Mixed hyperlipidemia (11/12/2022), Normocytic anemia (02/26/2022), Pleural effusion (02/26/2022), Protein-calorie malnutrition, severe (03/02/2022), Proteinuria (11/12/2022), Pulmonary nodule (01/14/2023), Rectus diastasis (01/04/2012), Recurrent pneumonia (02/27/2022), Solar lentigo (01/14/2023), Spinal stenosis of lumbar region without neurogenic claudication (11/12/2022), Stage 3a chronic kidney disease (CKD) (HCC) (02/26/2022), Stenosis of right carotid artery (11/12/2022), and Vitamin D  deficiency (12/17/2022).[name]   He presents with chief complaint of Hypertension .   Discussed the use of AI scribe software for clinical note transcription with the patient, who gave verbal consent to proceed.  History of Present Illness David [name] is an 89 year old male with resistant hypertension who presents for a blood pressure check.  He is experiencing significantly higher blood pressure readings at home compared to those recorded in the clinic, with systolic values ranging from 152 to 187 over a diastolic of 51. One reading reached as high as 190 systolic after taking his blood pressure medication.  He is  currently on a regimen of amlodipine  10 mg daily, olmesartan  hydrochlorothiazide 40/25 mg daily, and hydralazine  10 mg twice a day, taken in the morning and at night.  He also takes rosuvastatin  for cholesterol management. No chest pain, shortness of breath, or dizziness recently.  ROS  Past Surgical History:  Procedure Laterality Date   COLON SURGERY     KNEE SURGERY  2009 or 2010   replaced knee cap   PROSTATE SURGERY  2012    Outpatient Medications Prior to Visit  Medication Sig Dispense Refill   albuterol  (VENTOLIN  HFA) 108 (90 Base) MCG/ACT inhaler Inhale 2 puffs into the lungs.     amLODipine  (NORVASC ) 10 MG tablet Take 1 tablet (10 mg total) by mouth at bedtime. 90 tablet 0   Ascorbic Acid (VITAMIN C) 1000 MG tablet Take 1,000 mg by mouth daily.     B Complex Vitamins (VITAMIN B-COMPLEX) TABS Take 1 tablet by mouth every morning.     Cholecalciferol (VITAMIN D3) 50 MCG (2000 UT) TABS Take 2,000 Units by mouth daily.     Coenzyme Q10 (CO Q10 PO) Take 30 mLs by mouth daily.     Cyanocobalamin (VITAMIN B-12) 2500 MCG SUBL Place 2,500 mcg under the tongue daily.     finasteride (PROSCAR) 5 MG tablet Take 5 mg by mouth daily.     fluticasone  (FLONASE ) 50 MCG/ACT nasal spray Place 1 spray into both nostrils daily. 16 g 3   GARLIC PO Take by mouth.     MAGNESIUM GLYCINATE PO Take 240 mg by mouth daily.     olmesartan -hydrochlorothiazide (BENICAR  HCT) 40-25 MG tablet Take 1 tablet by mouth daily. 90 tablet 3   Omega-3 Fatty Acids (FISH OIL PO) Take 1 capsule by mouth daily.     pantoprazole  (PROTONIX ) 40 MG tablet Take 1 tablet by mouth once daily 90 tablet 0   Probiotic Product (PROBIOTIC PO) Take 1 capsule by mouth daily.     rosuvastatin  (CRESTOR ) 5 MG tablet Take 1 tablet (5 mg total) by mouth daily. 90 tablet 3   TURMERIC CURCUMIN PO Take 1 tablet by mouth daily.     Zinc  50 MG TABS Take 50 mg by mouth daily.     Amino Acids (AMINO ACID PO) Take 5 tablets by mouth daily.  (Patient not taking: Reported on 09/17/2023)     aspirin  EC 81 MG tablet Take 81 mg by mouth in the morning. Swallow whole. (Patient not taking: Reported on 09/17/2023)     loratadine  (CLARITIN ) 10 MG tablet Take 1 tablet (10 mg total) by mouth daily. (Patient not taking: Reported on 09/17/2023) 30 tablet 11   OVER THE COUNTER MEDICATION Joint liquid 2,00 glucosamine, 200 chondrotin     promethazine -dextromethorphan (PROMETHAZINE -DM) 6.25-15 MG/5ML syrup Take 5 mLs by mouth 4 (four) times daily as needed for cough. (Patient not taking: Reported on 09/17/2023) 118 mL 0   No facility-administered medications prior to visit.    Family History  Problem Relation Age of Onset   Cancer Mother        breast   Heart disease Father        heart attack at age 61    Social History   Socioeconomic History   Marital status: Married    Spouse name: Elveria   Number of children: 3   Years of education: 12   Highest education level: Not on file  Occupational History    Comment: retired  Tobacco Use   Smoking status: Former    Current packs/day: 0.00    Average packs/day: 2.0 packs/day for 20.0 years (40.0 ttl pk-yrs)    Types: Cigarettes    Start date: 02/24/1948    Quit date: 02/24/1968    Years since quitting: 55.6    Passive  exposure: Never   Smokeless tobacco: Never  Vaping Use   Vaping status: Never Used  Substance and Sexual Activity   Alcohol use: No   Drug use: No   Sexual activity: Not Currently  Other Topics Concern   Not on file  Social History Narrative   Lives at home with spouse   Caffeine use- occass coffee, tea   Social Drivers of Corporate investment banker Strain: Low Risk  (06/14/2023)   Overall Financial Resource Strain (CARDIA)    Difficulty of Paying Living Expenses: Not hard at all  Food Insecurity: Low Risk  (07/18/2023)   Received from [facility]   Hunger Vital Sign    Within the past 12 months, you worried that your food would run out before you got money to  buy more: Never true    Within the past 12 months, the food you bought just didn't last and you didn't have money to get more. : Never true  Transportation Needs: No Transportation Needs (07/18/2023)   Received from [facility]    In the past 12 months, has lack of reliable transportation kept you from medical appointments, meetings, work or from getting things needed for daily living? : No  Physical Activity: Sufficiently Active (06/14/2023)   Exercise Vital Sign    Days of Exercise per Week: 3 days    Minutes of Exercise per Session: 60 min  Stress: No Stress Concern Present (06/14/2023)   [employer] of Occupational Health - Occupational Stress Questionnaire    Feeling of Stress : Not at all  Social Connections: Moderately Integrated (06/14/2023)   Social Connection and Isolation Panel    Frequency of Communication with Friends and Family: More than three times a week    Frequency of Social Gatherings with Friends and Family: Once a week    Attends Religious Services: More than 4 times per year    Active Member of [employer] or Organizations: No    Attends Banker Meetings: Never    Marital Status: Married  Catering manager Violence: Not At Risk (06/14/2023)   Humiliation, Afraid, Rape, and Kick questionnaire    Fear of Current or Ex-Partner: No    Emotionally Abused: No    Physically Abused: No    Sexually Abused: No                                                                                                  Objective:  Physical Exam: BP (!) 148/60   Pulse 65   Temp 97.9 F (36.6 C)   Ht 5' 11 (1.803 m)   Wt 194 lb 9.6 oz (88.3 kg)   SpO2 96%   BMI 27.14 kg/m    Physical Exam VITALS: P- 65, BP- 144/60 GENERAL: Alert, cooperative, well developed, no acute distress. HEENT: Normocephalic, normal oropharynx, moist mucous membranes. CHEST: Clear to auscultation bilaterally, no wheezes, rhonchi, or crackles. CARDIOVASCULAR: Normal heart  rate and rhythm, S1 and S2 normal without murmurs. ABDOMEN: Soft, non-tender, non-distended, without organomegaly, normal bowel sounds. EXTREMITIES: No cyanosis or edema. NEUROLOGICAL: Cranial  nerves grossly intact, moves all extremities without gross motor or sensory deficit.   Physical Exam  CT Chest Wo Contrast Result Date: 07/20/2023 CLINICAL DATA:  Dyspnea, chronic, unclear etiology Respiratory illness, nondiagnostic xray EXAM: CT CHEST WITHOUT CONTRAST TECHNIQUE: Multidetector CT imaging of the chest was performed following the standard protocol without IV contrast. RADIATION DOSE REDUCTION: This exam was performed according to the departmental dose-optimization program which includes automated exposure control, adjustment of the mA and/or kV according to patient size and/or use of iterative reconstruction technique. COMPARISON:  09/17/2022 FINDINGS: Cardiovascular: Heart is normal size. Aorta is normal caliber. Diffuse 3 vessel coronary artery disease and aortic atherosclerosis. Mediastinum/Nodes: No mediastinal, hilar, or axillary adenopathy. Trachea and esophagus are unremarkable. Thyroid  unremarkable. Lungs/Pleura: Peribronchial thickening in the lower lobes. Linear areas of scarring or atelectasis in the left lower lobe, linear areas of scarring in the left lower lobe and lingula, stable since prior study. Clustered nodular densities in the right middle lobe. No effusions or pneumothorax. Upper Abdomen: No acute findings Musculoskeletal: Bilateral gynecomastia, left greater than right. IMPRESSION: No acute bony abnormality. Peribronchial thickening suggesting bronchitis, either acute or chronic. Clustered nodular densities in the right middle lobe could reflect small airways disease or also can be seen with mycobacterial avium infection. Diffuse 3 vessel coronary artery disease. Aortic Atherosclerosis (ICD10-I70.0). Electronically Signed   By: Franky Crease M.D.   On: 07/20/2023 17:40    Recent  Results (from the past 2160 hours)  Basic Metabolic Panel (BMET)     Status: Abnormal   Collection Time: 07/06/23  8:27 AM  Result Value Ref Range   Sodium 138 135 - 145 mEq/L   Potassium 4.6 3.5 - 5.1 mEq/L   Chloride 106 96 - 112 mEq/L   CO2 25 19 - 32 mEq/L   Glucose, Bld 96 70 - 99 mg/dL   BUN 33 (H) 6 - 23 mg/dL   Creatinine, Ser 8.31 (H) 0.40 - 1.50 mg/dL   GFR 63.86 (L) >39.99 mL/min    Comment: Calculated using the CKD-EPI Creatinine Equation (2021)   Calcium  9.7 8.4 - 10.5 mg/dL  Hepatic function panel     Status: None   Collection Time: 07/06/23  8:27 AM  Result Value Ref Range   Total Bilirubin 0.6 0.2 - 1.2 mg/dL   Bilirubin, Direct 0.1 0.0 - 0.3 mg/dL   Alkaline Phosphatase 63 39 - 117 U/L   AST 17 0 - 37 U/L   ALT 13 0 - 53 U/L   Total Protein 6.6 6.0 - 8.3 g/dL   Albumin 4.4 3.5 - 5.2 g/dL  Lipid Profile     Status: Abnormal   Collection Time: 07/06/23  8:27 AM  Result Value Ref Range   Cholesterol 185 0 - 200 mg/dL    Comment: ATP III Classification       Desirable:  < 200 mg/dL               Borderline High:  200 - 239 mg/dL          High:  > = 759 mg/dL   Triglycerides 894.9 0.0 - 149.0 mg/dL    Comment: Normal:  <849 mg/dLBorderline High:  150 - 199 mg/dL   HDL 35.99 >60.99 mg/dL   VLDL 78.9 0.0 - 59.9 mg/dL   LDL Cholesterol 899 (H) 0 - 99 mg/dL   Total CHOL/HDL Ratio 3     Comment:  Men          Women1/2 Average Risk     3.4          3.3Average Risk          5.0          4.42X Average Risk          9.6          7.13X Average Risk          15.0          11.0                       NonHDL 120.60     Comment: NOTE:  Non-HDL goal should be 30 mg/dL higher than patient's LDL goal (i.e. LDL goal of < 70 mg/dL, would have non-HDL goal of < 100 mg/dL)  POC RNCPI-80     Status: Normal   Collection Time: 07/20/23  9:22 AM  Result Value Ref Range   SARS Coronavirus 2 Ag Negative Negative  POCT Influenza A/B     Status: Normal   Collection Time:  07/20/23  9:22 AM  Result Value Ref Range   Influenza A, POC Negative Negative   Influenza B, POC Negative Negative  CBC w/Diff     Status: Abnormal   Collection Time: 07/20/23  9:36 AM  Result Value Ref Range   WBC 8.7 4.0 - 10.5 K/uL   RBC 4.70 4.22 - 5.81 Mil/uL   Hemoglobin 13.8 13.0 - 17.0 g/dL   HCT 59.2 60.9 - 47.9 %   MCV 86.6 78.0 - 100.0 fl   MCHC 33.8 30.0 - 36.0 g/dL   RDW 85.2 88.4 - 84.4 %   Platelets 299.0 150.0 - 400.0 K/uL   Neutrophils Relative % 83.5 (H) 43.0 - 77.0 %   Lymphocytes Relative 6.9 Repeated and verified X2. (L) 12.0 - 46.0 %   Monocytes Relative 9.0 3.0 - 12.0 %   Eosinophils Relative 0.3 0.0 - 5.0 %   Basophils Relative 0.3 0.0 - 3.0 %   Neutro Abs 7.2 1.4 - 7.7 K/uL   Lymphs Abs 0.6 (L) 0.7 - 4.0 K/uL   Monocytes Absolute 0.8 0.1 - 1.0 K/uL   Eosinophils Absolute 0.0 0.0 - 0.7 K/uL   Basophils Absolute 0.0 0.0 - 0.1 K/uL  Comp Met (CMET)     Status: Abnormal   Collection Time: 07/20/23  9:36 AM  Result Value Ref Range   Sodium 136 135 - 145 mEq/L   Potassium 4.4 3.5 - 5.1 mEq/L   Chloride 104 96 - 112 mEq/L   CO2 22 19 - 32 mEq/L   Glucose, Bld 95 70 - 99 mg/dL   BUN 40 (H) 6 - 23 mg/dL   Creatinine, Ser 8.22 (H) 0.40 - 1.50 mg/dL   Total Bilirubin 0.6 0.2 - 1.2 mg/dL   Alkaline Phosphatase 53 39 - 117 U/L   AST 14 0 - 37 U/L   ALT 12 0 - 53 U/L   Total Protein 7.0 6.0 - 8.3 g/dL   Albumin 4.4 3.5 - 5.2 g/dL   GFR 66.06 (L) >39.99 mL/min    Comment: Calculated using the CKD-EPI Creatinine Equation (2021)   Calcium  9.7 8.4 - 10.5 mg/dL  C-reactive protein     Status: None   Collection Time: 07/20/23  9:36 AM  Result Value Ref Range   CRP <1.0 0.5 - 20.0 mg/dL  Procalcitonin     Status: Abnormal  Collection Time: 07/20/23  9:36 AM  Result Value Ref Range   Procalcitonin 0.12 (H) <0.10 ng/mL    Comment: . Interpretation Guidelines . Diagnosis of systemic bacterial infection/sepsis <0.50 ng/mL: Low risk for sepsis: local bacterial  infection possible >=0.50 to <2.00 ng/mL: Sepsis is possible; other conditions possible >=2.00 to <10.00 ng/mL: Sepsis likely >10.00 ng/mL: Severe bacterial sepsis or septic shock probable . Diagnosis of lower respiratory tract infections: <0.10 ng/mL: Bacterial infection very unlikely >=0.10 to <0.25 ng/mL: Bacterial infection unlikely >=0.25 to <0.50 ng/mL: Bacterial infection likely >=0.50 ng/mL: Bacterial infection very likely . Procalcitonin levels should be evaluated in context of all laboratory findings and the total clinical status of the patient. . For additional information, please refer to http://education.[facility].com/faq/FAQ46 (This link is being provided for informational/educational purposes only.)   B Nat Peptide     Status: None   Collection Time: 07/20/23  9:36 AM  Result Value Ref Range   Pro B Natriuretic peptide (BNP) 68.0 0.0 - 100.0 pg/mL  Vitamin D  1,25 dihydroxy     Status: None   Collection Time: 08/31/23 11:16 AM  Result Value Ref Range   Vitamin D  1, 25 (OH)2 Total 34 18 - 72 pg/mL   Vitamin D3 1, 25 (OH)2 34 pg/mL   Vitamin D2 1, 25 (OH)2 <8 pg/mL    Comment: (Note) Vitamin D3, 1,25(OH)2 indicates both endogenous  production and supplementation. Vitamin D2, 1,25(OH)2 is  an indicator of exogenous sources, such as diet or  supplementation. Interpretation and therapy are based on  measurement of Vitamin D , 1,25 (OH)2, Total. . This test was developed, and its analytical performance  characteristics have been determined by [employer]. It has not been cleared or approved by the  FDA. This assay has been validated pursuant to the CLIA  regulations and is used for clinical purposes. . For additional information, please refer to http://education.[facility].com/faq/FAQ199 (This link is being provided for  informational/educational purposes only.) . [facility] [address] [place]  [address] [phone] Johanna Agent L. Gino, MD, PhD   PSA     Status: Abnormal   Collection Time: 08/31/23 11:16 AM  Result Value Ref Range   PSA 0.07 (L) 0.10 - 4.00 ng/mL    Comment: Test performed using [employer] PSA Assay, a parmagnetic partical, chemiluminecent immunoassay.  TSH     Status: None   Collection Time: 08/31/23 11:16 AM  Result Value Ref Range   TSH 2.11 0.35 - 5.50 uIU/mL        Beverley Adine Hummer, MD, MS

## 2023-09-17 NOTE — Patient Instructions (Addendum)
  VISIT SUMMARY: Today, you came in for a blood pressure check. Your blood pressure readings at home have been significantly higher than those recorded in the clinic. We discussed your current medications and made some adjustments to better manage your blood pressure. We also reviewed your cholesterol management.  YOUR PLAN: -RESISTANT HYPERTENSION: Resistant hypertension means your blood pressure remains high despite taking multiple medications. Your blood pressure in the clinic was well controlled, but your home readings are much higher. We will refer you to cardiology for 24-hour ambulatory blood pressure monitoring to get a more accurate assessment. Please bring your home blood pressure cuff to your next appointment for calibration.   INSTRUCTIONS: 1. Refer to cardiology for 24-hour ambulatory blood pressure monitoring. 2. Bring your home blood pressure cuff to your next appointment for calibration. 3. Start take hydralazine  10 mg three times a daily as needed if your blood pressure is greater than 160/100. 4. Schedule a follow-up appointment in two weeks.

## 2023-09-21 ENCOUNTER — Ambulatory Visit (HOSPITAL_COMMUNITY)
Admission: RE | Admit: 2023-09-21 | Discharge: 2023-09-21 | Disposition: A | Discharge status: Home / Self Care | Source: Ambulatory Visit | Attending: Family Medicine | Admitting: Family Medicine

## 2023-09-21 DIAGNOSIS — I10 Essential (primary) hypertension: Secondary | ICD-10-CM | POA: Diagnosis not present

## 2023-09-23 ENCOUNTER — Institutional Professional Consult (permissible substitution) (HOSPITAL_BASED_OUTPATIENT_CLINIC_OR_DEPARTMENT_OTHER): Admitting: Family

## 2023-09-24 ENCOUNTER — Telehealth: Payer: Self-pay | Admitting: *Deleted

## 2023-09-24 ENCOUNTER — Other Ambulatory Visit (HOSPITAL_BASED_OUTPATIENT_CLINIC_OR_DEPARTMENT_OTHER): Payer: Self-pay | Admitting: *Deleted

## 2023-09-24 DIAGNOSIS — I10 Essential (primary) hypertension: Secondary | ICD-10-CM

## 2023-09-24 NOTE — Telephone Encounter (Signed)
 Returned call to patient's wife. She would like the results of 09/15/23 CT scan.

## 2023-09-26 ENCOUNTER — Ambulatory Visit: Payer: Self-pay | Admitting: Pulmonary Disease

## 2023-09-26 NOTE — Progress Notes (Signed)
 Please let patient know his lung nodules have cleared up on recent CT scan.

## 2023-09-27 NOTE — Telephone Encounter (Signed)
 Spoke with patient verbalized understanding   NFN

## 2023-09-29 ENCOUNTER — Ambulatory Visit (INDEPENDENT_AMBULATORY_CARE_PROVIDER_SITE_OTHER): Admitting: Family Medicine

## 2023-09-29 ENCOUNTER — Encounter: Payer: Self-pay | Admitting: Family Medicine

## 2023-09-29 VITALS — BP 150/50 | HR 51 | Temp 97.3°F | Resp 18 | Wt 198.4 lb

## 2023-09-29 DIAGNOSIS — M25562 Pain in left knee: Secondary | ICD-10-CM

## 2023-09-29 DIAGNOSIS — M5442 Lumbago with sciatica, left side: Secondary | ICD-10-CM | POA: Diagnosis not present

## 2023-09-29 DIAGNOSIS — I1A0 Resistant hypertension: Secondary | ICD-10-CM | POA: Diagnosis not present

## 2023-09-29 MED ORDER — DICLOFENAC SODIUM 1 % EX GEL: 4.0000 g | Freq: Four times a day (QID) | CUTANEOUS | 3 refills | Status: AC | PRN

## 2023-09-29 MED ORDER — ACETAMINOPHEN ER 650 MG PO TBCR: 650.0000 mg | EXTENDED_RELEASE_TABLET | Freq: Three times a day (TID) | ORAL | 0 refills | Status: AC | PRN

## 2023-09-29 NOTE — Progress Notes (Signed)
 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 Blood pressure readings range from 150-160/50-60. Managed by cardiology with recent addition of spironolactone  12.5 mg daily and continuation of amlodipine  10 mg daily. No acute symptoms such as dizziness, chest pain, palpitations, or shortness of breath. - Defer management to cardiology for further monitoring - Follow up with cardiology in two days for stress test and further evaluation  Chronic right hip and knee pain likely due to osteoarthritis Chronic right hip and knee pain, likely due to osteoarthritis, persisting for about 1.5 to 2 weeks. Pain is constant, sometimes worse in the morning, and may be related to weather changes. Pain radiates from the hip down the leg, possibly indicating sciatica or lower disc issues. No falls or significant weakness. Prefers to avoid oral pain medications. - Recommend over-the-counter arthritis Tylenol  650 mg for pain management - Suggest diclofenac  gel (Voltaren ) for topical pain relief - Order lumbar spine and knee x-rays to assess for arthritis or other structural issues      There are no discontinued medications.  Return if symptoms worsen or fail to improve.        Subjective:   Encounter date: 09/29/2023  [name] is a 89 y.o. male who has Rectus diastasis; Community acquired pneumonia of left lower lobe of lung; Acute hypoxic respiratory failure (HCC); Pleural effusion; Stage 3a chronic kidney disease (CKD) (HCC); HTN (hypertension); Normocytic anemia; Recurrent pneumonia; Empyema (HCC); Protein-calorie malnutrition, severe; Dysphagia; Stenosis of right carotid artery; Proteinuria; Chronic fatigue; Spinal stenosis of lumbar region without neurogenic claudication; Bradycardia; Mixed hyperlipidemia; Benign prostatic hyperplasia with lower urinary tract  symptoms; Memory loss; BBB (bundle branch block); Vitamin D  deficiency; History of colon cancer; Solar lentigo; Feels cold; History of pneumonia; Pulmonary nodule; Stage 3b chronic kidney disease (HCC); 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   He  has a past medical history of Acute hypoxic respiratory failure (HCC) (02/26/2022), BBB (bundle branch block) (11/12/2022), Benign prostatic hyperplasia with lower urinary tract symptoms (11/12/2022), Bradycardia (11/12/2022), Chronic fatigue (11/12/2022), Community acquired pneumonia of left lower lobe of lung (02/26/2022), Dysphagia (05/08/2022), Empyema (HCC) (02/28/2022), Feels cold (01/14/2023), History of colon cancer (12/17/2022), History of pneumonia (01/14/2023), HTN (hypertension) (02/26/2022), Hypertension, Memory loss (11/12/2022), Mixed hyperlipidemia (11/12/2022), Normocytic anemia (02/26/2022), Pleural effusion (02/26/2022), Protein-calorie malnutrition, severe (03/02/2022), Proteinuria (11/12/2022), Pulmonary nodule (01/14/2023), Rectus diastasis (01/04/2012), Recurrent pneumonia (02/27/2022), Solar lentigo (01/14/2023), Spinal stenosis of lumbar region without neurogenic claudication (11/12/2022), Stage 3a chronic kidney disease (CKD) (HCC) (02/26/2022), Stenosis of right carotid artery (11/12/2022), and Vitamin D  deficiency (12/17/2022).David [name]   He presents with chief complaint of Hypertension (2 week follow up. Pt stated BP reading at home range from 160/55. ) .   Discussed the use of AI scribe software for clinical note transcription with the patient, who gave verbal consent to proceed.  History of Present Illness David [name] is an 89 year old male with resistant hypertension who presents with difficulty in blood  pressure management.  He has resistant hypertension with a wide pulse pressure and difficulty controlling his blood pressure despite being on multiple medications. His current regimen includes spironolactone  12.5 mg daily and amlodipine  10 mg daily. A recent cardiology visit recorded his blood pressure at  149/66 mmHg, and today it ranges from 150-160/50-60 mmHg, consistent with his baseline. No chest pain, palpitations, shortness of breath, or dizziness upon standing. He notes some leg swelling. He has a history of multiple medication trials, including hydralazine , with varying dosages and combinations, but has not achieved optimal control.  He also has knee and hip pain that started about one and a half to two weeks ago, with pain radiating from the hip down the leg. The pain is constant but worsens in the morning. He has not been taking any pain medication, including over-the-counter options, due to concerns about dependency. He denies any recent falls or significant weakness.  He mentions having undergone lung tests recently, with results pending. He has a stress test scheduled for Friday and plans to travel for his daughter's birthday celebration.     ROS  Past Surgical History:  Procedure Laterality Date   COLON SURGERY     KNEE SURGERY  2009 or 2010   replaced knee cap   PROSTATE SURGERY  2012    Outpatient Medications Prior to Visit  Medication Sig Dispense Refill   spironolactone  (ALDACTONE ) 25 MG tablet Take 25 mg by mouth daily.     albuterol  (VENTOLIN  HFA) 108 (90 Base) MCG/ACT inhaler Inhale 2 puffs into the lungs.     Amino Acids (AMINO ACID PO) Take 5 tablets by mouth daily. (Patient not taking: Reported on 09/17/2023)     amLODipine  (NORVASC ) 10 MG tablet Take 1 tablet (10 mg total) by mouth at bedtime. 90 tablet 0   Ascorbic Acid (VITAMIN C) 1000 MG tablet Take 1,000 mg by mouth daily.     aspirin  EC 81 MG tablet Take 81 mg by mouth in the morning. Swallow whole. (Patient  not taking: Reported on 09/17/2023)     B Complex Vitamins (VITAMIN B-COMPLEX) TABS Take 1 tablet by mouth every morning.     Cholecalciferol (VITAMIN D3) 50 MCG (2000 UT) TABS Take 2,000 Units by mouth daily.     Coenzyme Q10 (CO Q10 PO) Take 30 mLs by mouth daily.     Cyanocobalamin (VITAMIN B-12) 2500 MCG SUBL Place 2,500 mcg under the tongue daily.     finasteride (PROSCAR) 5 MG tablet Take 5 mg by mouth daily.     fluticasone  (FLONASE ) 50 MCG/ACT nasal spray Place 1 spray into both nostrils daily. 16 g 3   GARLIC PO Take by mouth.     hydrALAZINE  (APRESOLINE ) 10 MG tablet Take 10 mg (1 table)  3 times day if BP > 160/100 270 tablet 3   loratadine  (CLARITIN ) 10 MG tablet Take 1 tablet (10 mg total) by mouth daily. (Patient not taking: Reported on 09/17/2023) 30 tablet 11   MAGNESIUM GLYCINATE PO Take 240 mg by mouth daily.     olmesartan -hydrochlorothiazide (BENICAR  HCT) 40-25 MG tablet Take 1 tablet by mouth daily. 90 tablet 3   Omega-3 Fatty Acids (FISH OIL PO) Take 1 capsule by mouth daily.     OVER THE COUNTER MEDICATION Joint liquid 2,00 glucosamine, 200 chondrotin     pantoprazole  (PROTONIX ) 40 MG tablet Take 1 tablet by mouth once daily 90 tablet 0   Probiotic Product (PROBIOTIC PO) Take 1 capsule by mouth daily.     promethazine -dextromethorphan (PROMETHAZINE -DM) 6.25-15 MG/5ML syrup Take 5 mLs by mouth 4 (four) times daily as needed for cough. (Patient not taking: Reported on 09/17/2023) 118 mL 0   rosuvastatin  (CRESTOR ) 5 MG tablet Take 1 tablet (5 mg total) by mouth daily.  90 tablet 3   TURMERIC CURCUMIN PO Take 1 tablet by mouth daily.     Zinc  50 MG TABS Take 50 mg by mouth daily.     No facility-administered medications prior to visit.    Family History  Problem Relation Age of Onset   Cancer Mother        breast   Heart disease Father        heart attack at age 50    Social History   Socioeconomic History   Marital status: Married    Spouse name: Elveria   Number  of children: 3   Years of education: 12   Highest education level: Not on file  Occupational History    Comment: retired  Tobacco Use   Smoking status: Former    Current packs/day: 0.00    Average packs/day: 2.0 packs/day for 20.0 years (40.0 ttl pk-yrs)    Types: Cigarettes    Start date: 02/24/1948    Quit date: 02/24/1968    Years since quitting: 55.6    Passive exposure: Never   Smokeless tobacco: Never  Vaping Use   Vaping status: Never Used  Substance and Sexual Activity   Alcohol use: No   Drug use: No   Sexual activity: Not Currently  Other Topics Concern   Not on file  Social History Narrative   Lives at home with spouse   Caffeine use- occass coffee, tea   Social Drivers of Corporate investment banker Strain: Low Risk  (06/14/2023)   Overall Financial Resource Strain (CARDIA)    Difficulty of Paying Living Expenses: Not hard at all  Food Insecurity: Low Risk  (07/18/2023)   Received from [facility]   Hunger Vital Sign    Within the past 12 months, you worried that your food would run out before you got money to buy more: Never true    Within the past 12 months, the food you bought just didn't last and you didn't have money to get more. : Never true  Transportation Needs: No Transportation Needs (07/18/2023)   Received from [facility]    In the past 12 months, has lack of reliable transportation kept you from medical appointments, meetings, work or from getting things needed for daily living? : No  Physical Activity: Sufficiently Active (06/14/2023)   Exercise Vital Sign    Days of Exercise per Week: 3 days    Minutes of Exercise per Session: 60 min  Stress: No Stress Concern Present (06/14/2023)   [employer] of Occupational Health - Occupational Stress Questionnaire    Feeling of Stress : Not at all  Social Connections: Moderately Integrated (06/14/2023)   Social Connection and Isolation Panel    Frequency of Communication with Friends  and Family: More than three times a week    Frequency of Social Gatherings with Friends and Family: Once a week    Attends Religious Services: More than 4 times per year    Active Member of [employer] or Organizations: No    Attends Banker Meetings: Never    Marital Status: Married  Catering manager Violence: Not At Risk (06/14/2023)   Humiliation, Afraid, Rape, and Kick questionnaire    Fear of Current or Ex-Partner: No    Emotionally Abused: No    Physically Abused: No    Sexually Abused: No  Objective:  Physical Exam: BP (!) 150/50 (BP Location: Right Arm, Patient Position: Sitting, Cuff Size: Normal) Comment: recheck  Pulse (!) 51   Temp (!) 97.3 F (36.3 C) (Temporal)   Resp 18   Wt 198 lb 6.4 oz (90 kg)   SpO2 96%   BMI 27.67 kg/m    Physical Exam GENERAL: Alert, cooperative, well developed, no acute distress HEENT: Normocephalic, normal oropharynx, moist mucous membranes CHEST: Clear to auscultation bilaterally, No wheezes, rhonchi, or crackles CARDIOVASCULAR: Normal heart rate and rhythm, S1 and S2 normal without murmurs ABDOMEN: Soft, non-tender, non-distended, without organomegaly, Normal bowel sounds EXTREMITIES: 1+ BLT Lex, pitting, nontender midline lumbar spine NEUROLOGICAL: Cranial nerves grossly intact, Moves all extremities without gross motor or sensory deficit   Physical Exam  VAS US  RENAL ARTERY DUPLEX Result Date: 09/22/2023 ABDOMINAL VISCERAL Patient Name:  David [name]  Date of Exam:   09/21/2023 Medical Rec #: [id number]      Accession #:    [id number] Date of Birth: 1933/11/16      Patient Gender: M Patient Age:   22 years Exam Location:  [address] Procedure:      VAS US  RENAL ARTERY DUPLEX Referring Phys: [id number] BEVERLEY KATHEE HUMMER -------------------------------------------------------------------------------- Indications: primary hypertension  High Risk Factors: Hypertension, hyperlipidemia, past history of smoking,                    coronary artery disease. Comparison Study: N/A Performing Technologist: Dena Pane  Examination Guidelines: A complete evaluation includes B-mode imaging, spectral Doppler, color Doppler, and power Doppler as needed of all accessible portions of each vessel. Bilateral testing is considered an integral part of a complete examination. Limited examinations for reoccurring indications may be performed as noted.  Duplex Findings: +--------------------+--------+--------+------+--------+ Mesenteric          PSV cm/sEDV cm/sPlaqueComments +--------------------+--------+--------+------+--------+ Aorta Prox            137                          +--------------------+--------+--------+------+--------+ Aorta Distal          184                          +--------------------+--------+--------+------+--------+ Celiac Artery Origin  330                          +--------------------+--------+--------+------+--------+ SMA Proximal          260                          +--------------------+--------+--------+------+--------+    +------------------+--------+--------+-------+ Right Renal ArteryPSV cm/sEDV cm/sComment +------------------+--------+--------+-------+ Origin              141      41           +------------------+--------+--------+-------+ Proximal            123      23           +------------------+--------+--------+-------+ Mid                 213      22           +------------------+--------+--------+-------+ Distal              168      17           +------------------+--------+--------+-------+ +-----------------+--------+--------+-------+  Left Renal ArteryPSV cm/sEDV cm/sComment +-----------------+--------+--------+-------+ Origin             126      22           +-----------------+--------+--------+-------+ Proximal           187      13            +-----------------+--------+--------+-------+ Mid                 85      18           +-----------------+--------+--------+-------+ Distal              87      14           +-----------------+--------+--------+-------+  Technologist observations: Technically difficult exam due to patient respirations. +------------+--------+--------+----+-----------+--------+--------+----+ Right KidneyPSV cm/sEDV cm/sRI  Left KidneyPSV cm/sEDV cm/sRI   +------------+--------+--------+----+-----------+--------+--------+----+ Upper Pole  26      3       0.88Upper Pole 26      5       0.83 +------------+--------+--------+----+-----------+--------+--------+----+ Mid         31      4       0.        29      4       0.86 +------------+--------+--------+----+-----------+--------+--------+----+ Lower Pole  26      4       0.86Lower Pole 28      4       0.86 +------------+--------+--------+----+-----------+--------+--------+----+ Hilar       49      7       0.86Hilar      46      7       0.85 +------------+--------+--------+----+-----------+--------+--------+----+ +------------------+-----+------------------+-----+ Right Kidney           Left Kidney             +------------------+-----+------------------+-----+ RAR                    RAR                     +------------------+-----+------------------+-----+ RAR (manual)      1.6  RAR (manual)      1.4   +------------------+-----+------------------+-----+ Cortex            15   Cortex            16    +------------------+-----+------------------+-----+ Cortex thickness       Corex thickness         +------------------+-----+------------------+-----+ Kidney length (cm)13.60Kidney length (cm)13.10 +------------------+-----+------------------+-----+  Summary: Renal:  Right: Normal size right kidney. RRV flow present. 1-59% stenosis of        the right renal artery. Abnormal right Resistive Index. Left:   Normal size of left kidney. LRV flow present. No evidence of        left renal artery stenosis. Abnormal left Resisitve Index. Mesenteric: Normal Superior Mesenteric artery findings. 70 to 99% stenosis in the celiac artery.  *See table(s) above for measurements and observations.  Diagnosing physician: Deatrice Cage MD  Electronically signed by Deatrice Cage MD on 09/22/2023 at 1:06:59 PM.    Final    CT Chest Wo Contrast Result Date: 09/21/2023 CLINICAL DATA:  Nodule follow up EXAM: CT CHEST WITHOUT CONTRAST TECHNIQUE: Multidetector CT imaging of the chest was performed following the standard protocol without IV contrast. RADIATION DOSE REDUCTION: This  exam was performed according to the departmental dose-optimization program which includes automated exposure control, adjustment of the mA and/or kV according to patient size and/or use of iterative reconstruction technique. COMPARISON:  None Available. FINDINGS: Cardiovascular: Dense atheromatous calcifications of aorta and coronary arteries. Mild cardiomegaly. No pericardial effusion. Mediastinum/Nodes: No enlarged mediastinal or axillary lymph nodes. Thyroid  gland, trachea, and esophagus demonstrate no significant findings. Lungs/Pleura: Tree-in-bud appearance of the interstitium in the right middle lobe present on the prior study has resolved. There is minimal dependent subsegmental atelectasis or scarring in lungs otherwise clear. No pneumothorax or pleural effusion. Upper Abdomen: No acute abnormality. Musculoskeletal: Mild bilateral gynecomastia. IMPRESSION: 1. Interval resolution of the tree-in-bud appearance in the right middle lobe. 2. Aortic and coronary artery atherosclerosis (ICD10-I70.0). 3. Gynecomastia. Electronically Signed   By: Fonda Field M.D.   On: 09/21/2023 17:12   CT Chest Wo Contrast Result Date: 07/20/2023 CLINICAL DATA:  Dyspnea, chronic, unclear etiology Respiratory illness, nondiagnostic xray EXAM: CT CHEST WITHOUT CONTRAST  TECHNIQUE: Multidetector CT imaging of the chest was performed following the standard protocol without IV contrast. RADIATION DOSE REDUCTION: This exam was performed according to the departmental dose-optimization program which includes automated exposure control, adjustment of the mA and/or kV according to patient size and/or use of iterative reconstruction technique. COMPARISON:  09/17/2022 FINDINGS: Cardiovascular: Heart is normal size. Aorta is normal caliber. Diffuse 3 vessel coronary artery disease and aortic atherosclerosis. Mediastinum/Nodes: No mediastinal, hilar, or axillary adenopathy. Trachea and esophagus are unremarkable. Thyroid  unremarkable. Lungs/Pleura: Peribronchial thickening in the lower lobes. Linear areas of scarring or atelectasis in the left lower lobe, linear areas of scarring in the left lower lobe and lingula, stable since prior study. Clustered nodular densities in the right middle lobe. No effusions or pneumothorax. Upper Abdomen: No acute findings Musculoskeletal: Bilateral gynecomastia, left greater than right. IMPRESSION: No acute bony abnormality. Peribronchial thickening suggesting bronchitis, either acute or chronic. Clustered nodular densities in the right middle lobe could reflect small airways disease or also can be seen with mycobacterial avium infection. Diffuse 3 vessel coronary artery disease. Aortic Atherosclerosis (ICD10-I70.0). Electronically Signed   By: Franky Crease M.D.   On: 07/20/2023 17:40    Recent Results (from the past 2160 hours)  Basic Metabolic Panel (BMET)     Status: Abnormal   Collection Time: 07/06/23  8:27 AM  Result Value Ref Range   Sodium 138 135 - 145 mEq/L   Potassium 4.6 3.5 - 5.1 mEq/L   Chloride 106 96 - 112 mEq/L   CO2 25 19 - 32 mEq/L   Glucose, Bld 96 70 - 99 mg/dL   BUN 33 (H) 6 - 23 mg/dL   Creatinine, Ser 8.31 (H) 0.40 - 1.50 mg/dL   GFR 63.86 (L) >39.99 mL/min    Comment: Calculated using the CKD-EPI Creatinine Equation  (2021)   Calcium  9.7 8.4 - 10.5 mg/dL  Hepatic function panel     Status: None   Collection Time: 07/06/23  8:27 AM  Result Value Ref Range   Total Bilirubin 0.6 0.2 - 1.2 mg/dL   Bilirubin, Direct 0.1 0.0 - 0.3 mg/dL   Alkaline Phosphatase 63 39 - 117 U/L   AST 17 0 - 37 U/L   ALT 13 0 - 53 U/L   Total Protein 6.6 6.0 - 8.3 g/dL   Albumin 4.4 3.5 - 5.2 g/dL  Lipid Profile     Status: Abnormal   Collection Time: 07/06/23  8:27 AM  Result Value Ref Range  Cholesterol 185 0 - 200 mg/dL    Comment: ATP III Classification       Desirable:  < 200 mg/dL               Borderline High:  200 - 239 mg/dL          High:  > = 759 mg/dL   Triglycerides 894.9 0.0 - 149.0 mg/dL    Comment: Normal:  <849 mg/dLBorderline High:  150 - 199 mg/dL   HDL 35.99 >60.99 mg/dL   VLDL 78.9 0.0 - 59.9 mg/dL   LDL Cholesterol 899 (H) 0 - 99 mg/dL   Total CHOL/HDL Ratio 3     Comment:                Men          Women1/2 Average Risk     3.4          3.3Average Risk          5.0          4.42X Average Risk          9.6          7.13X Average Risk          15.0          11.0                       NonHDL 120.60     Comment: NOTE:  Non-HDL goal should be 30 mg/dL higher than patient's LDL goal (i.e. LDL goal of < 70 mg/dL, would have non-HDL goal of < 100 mg/dL)  POC RNCPI-80     Status: Normal   Collection Time: 07/20/23  9:22 AM  Result Value Ref Range   SARS Coronavirus 2 Ag Negative Negative  POCT Influenza A/B     Status: Normal   Collection Time: 07/20/23  9:22 AM  Result Value Ref Range   Influenza A, POC Negative Negative   Influenza B, POC Negative Negative  CBC w/Diff     Status: Abnormal   Collection Time: 07/20/23  9:36 AM  Result Value Ref Range   WBC 8.7 4.0 - 10.5 K/uL   RBC 4.70 4.22 - 5.81 Mil/uL   Hemoglobin 13.8 13.0 - 17.0 g/dL   HCT 59.2 60.9 - 47.9 %   MCV 86.6 78.0 - 100.0 fl   MCHC 33.8 30.0 - 36.0 g/dL   RDW 85.2 88.4 - 84.4 %   Platelets 299.0 150.0 - 400.0 K/uL   Neutrophils  Relative % 83.5 (H) 43.0 - 77.0 %   Lymphocytes Relative 6.9 Repeated and verified X2. (L) 12.0 - 46.0 %   Monocytes Relative 9.0 3.0 - 12.0 %   Eosinophils Relative 0.3 0.0 - 5.0 %   Basophils Relative 0.3 0.0 - 3.0 %   Neutro Abs 7.2 1.4 - 7.7 K/uL   Lymphs Abs 0.6 (L) 0.7 - 4.0 K/uL   Monocytes Absolute 0.8 0.1 - 1.0 K/uL   Eosinophils Absolute 0.0 0.0 - 0.7 K/uL   Basophils Absolute 0.0 0.0 - 0.1 K/uL  Comp Met (CMET)     Status: Abnormal   Collection Time: 07/20/23  9:36 AM  Result Value Ref Range   Sodium 136 135 - 145 mEq/L   Potassium 4.4 3.5 - 5.1 mEq/L   Chloride 104 96 - 112 mEq/L   CO2 22 19 - 32 mEq/L   Glucose, Bld 95 70 - 99 mg/dL  BUN 40 (H) 6 - 23 mg/dL   Creatinine, Ser 8.22 (H) 0.40 - 1.50 mg/dL   Total Bilirubin 0.6 0.2 - 1.2 mg/dL   Alkaline Phosphatase 53 39 - 117 U/L   AST 14 0 - 37 U/L   ALT 12 0 - 53 U/L   Total Protein 7.0 6.0 - 8.3 g/dL   Albumin 4.4 3.5 - 5.2 g/dL   GFR 66.06 (L) >39.99 mL/min    Comment: Calculated using the CKD-EPI Creatinine Equation (2021)   Calcium  9.7 8.4 - 10.5 mg/dL  C-reactive protein     Status: None   Collection Time: 07/20/23  9:36 AM  Result Value Ref Range   CRP <1.0 0.5 - 20.0 mg/dL  Procalcitonin     Status: Abnormal   Collection Time: 07/20/23  9:36 AM  Result Value Ref Range   Procalcitonin 0.12 (H) <0.10 ng/mL    Comment: . Interpretation Guidelines . Diagnosis of systemic bacterial infection/sepsis <0.50 ng/mL: Low risk for sepsis: local bacterial infection possible >=0.50 to <2.00 ng/mL: Sepsis is possible; other conditions possible >=2.00 to <10.00 ng/mL: Sepsis likely >10.00 ng/mL: Severe bacterial sepsis or septic shock probable . Diagnosis of lower respiratory tract infections: <0.10 ng/mL: Bacterial infection very unlikely >=0.10 to <0.25 ng/mL: Bacterial infection unlikely >=0.25 to <0.50 ng/mL: Bacterial infection likely >=0.50 ng/mL: Bacterial infection very likely . Procalcitonin levels  should be evaluated in context of all laboratory findings and the total clinical status of the patient. . For additional information, please refer to http://education.[facility].com/faq/FAQ46 (This link is being provided for informational/educational purposes only.)   B Nat Peptide     Status: None   Collection Time: 07/20/23  9:36 AM  Result Value Ref Range   Pro B Natriuretic peptide (BNP) 68.0 0.0 - 100.0 pg/mL  Vitamin D  1,25 dihydroxy     Status: None   Collection Time: 08/31/23 11:16 AM  Result Value Ref Range   Vitamin D  1, 25 (OH)2 Total 34 18 - 72 pg/mL   Vitamin D3 1, 25 (OH)2 34 pg/mL   Vitamin D2 1, 25 (OH)2 <8 pg/mL    Comment: (Note) Vitamin D3, 1,25(OH)2 indicates both endogenous  production and supplementation. Vitamin D2, 1,25(OH)2 is  an indicator of exogenous sources, such as diet or  supplementation. Interpretation and therapy are based on  measurement of Vitamin D , 1,25 (OH)2, Total. . This test was developed, and its analytical performance  characteristics have been determined by [employer]. It has not been cleared or approved by the  FDA. This assay has been validated pursuant to the CLIA  regulations and is used for clinical purposes. . For additional information, please refer to http://education.[facility].com/faq/FAQ199 (This link is being provided for  informational/educational purposes only.) . [facility] [address] [facility] [address] [place] [address] [id number] Johanna Agent L. Gino, MD, PhD   PSA     Status: Abnormal   Collection Time: 08/31/23 11:16 AM  Result Value Ref Range   PSA 0.07 (L) 0.10 - 4.00 ng/mL    Comment: Test performed using Access Hybritech PSA Assay, a parmagnetic partical, chemiluminecent immunoassay.  TSH     Status: None   Collection Time: 08/31/23 11:16 AM  Result Value Ref Range   TSH 2.11 0.35 - 5.50 uIU/mL        Beverley Adine Hummer, MD, MS

## 2023-09-29 NOTE — Patient Instructions (Signed)
  VISIT SUMMARY: Today, we discussed your ongoing issues with resistant hypertension and your recent hip and knee pain. Your blood pressure remains difficult to control despite multiple medications, and you have been experiencing pain in your right hip and knee for the past couple of weeks.  YOUR PLAN: -RESISTANT HYPERTENSION WITH WIDE PULSE PRESSURE: Resistant hypertension means your blood pressure is difficult to control even with multiple medications. Your current blood pressure readings are between 150-160/50-60 mmHg. We will continue to defer management to your cardiologist, and you should follow up with them in two days for your scheduled stress test and further evaluation.  -CHRONIC RIGHT HIP AND KNEE PAIN LIKELY DUE TO OSTEOARTHRITIS: Osteoarthritis is a condition where the protective cartilage that cushions the ends of your bones wears down over time, causing pain and stiffness. Your hip and knee pain has been persistent for about 1.5 to 2 weeks. We recommend taking over-the-counter arthritis Tylenol  650 mg for pain management and using diclofenac  gel (Voltaren ) for topical pain relief. We will also order x-rays of your lumbar spine and knee to check for arthritis or other structural issues.  INSTRUCTIONS: Please follow up with your cardiologist in two days for your stress test and further evaluation of your blood pressure. Additionally, get the x-rays of your lumbar spine and knee as ordered.  For back and knee xray, go to:    [place] [facility] [address] Christianna Hering [place], [place], [place] [address] Phone: [phone]

## 2023-09-30 ENCOUNTER — Other Ambulatory Visit: Payer: Self-pay | Admitting: Family Medicine

## 2023-10-01 ENCOUNTER — Ambulatory Visit: Admitting: Family Medicine

## 2023-10-06 ENCOUNTER — Ambulatory Visit (INDEPENDENT_AMBULATORY_CARE_PROVIDER_SITE_OTHER)
Admission: RE | Admit: 2023-10-06 | Discharge: 2023-10-06 | Disposition: A | Discharge status: Home / Self Care | Source: Ambulatory Visit | Attending: Family Medicine | Admitting: Family Medicine

## 2023-10-06 DIAGNOSIS — M5442 Lumbago with sciatica, left side: Secondary | ICD-10-CM | POA: Diagnosis not present

## 2023-10-06 DIAGNOSIS — M25562 Pain in left knee: Secondary | ICD-10-CM | POA: Diagnosis not present

## 2023-10-21 ENCOUNTER — Encounter: Payer: Self-pay | Admitting: Primary Care

## 2023-10-21 ENCOUNTER — Ambulatory Visit (INDEPENDENT_AMBULATORY_CARE_PROVIDER_SITE_OTHER): Admitting: Primary Care

## 2023-10-21 VITALS — BP 126/60 | HR 47 | Temp 97.3°F | Ht 71.0 in | Wt 190.4 lb

## 2023-10-21 DIAGNOSIS — R911 Solitary pulmonary nodule: Secondary | ICD-10-CM

## 2023-10-21 DIAGNOSIS — R009 Unspecified abnormalities of heart beat: Secondary | ICD-10-CM

## 2023-10-21 NOTE — Patient Instructions (Addendum)
  YOUR PLAN: -PULMONARY SCARRING: Pulmonary scarring refers to the presence of scar tissue in the lungs, which can occur after an infection like pneumonia. Your CT scan showed minimal scarring, and your previous lung nodules have resolved. Your lungs are clear, and your breathing is back to normal.   -BRADYCARDIA: Bradycardia means having a slower than normal heart rate. Your heart rate is currently low but stable, and you have not experienced any concerning symptoms like dizziness or fainting, except in heat. We will recheck your heart rate, and if it is less than 50 beats per minute, we will perform an EKG to monitor your heart's activity.  Orders: Recheck HR <50 please perform ECG   Follow-up 6-12 months with Dr. Kara or sooner if experience symptoms like coughing up colored mucus or worsening breathing.

## 2023-10-21 NOTE — Addendum Note (Signed)
 Addended by: Sarahann Horrell T on: 10/21/2023 10:29 AM   Modules accepted: Orders

## 2023-10-21 NOTE — Progress Notes (Signed)
 @Patient  ID: David [name] Mania, male    DOB: 1933-03-05, 89 y.o.   MRN: [id number]  No chief complaint on file.   Referring provider: Sebastian Beverley NOVAK, MD  HPI: 89 year old male, former smoker. PMG significant for HTN, CAD, BBB, aortic atherosclerosis, recurrent pneumonia, pleural effusion, dysphagia, CKD, mixed hyperlipidemia. Patient of Dr. Kara, last seen by Dr. MEADE on 07/27/23 for CAP.   Previous [facility] pulmonary encounter:  Interval Updates: Former patient of Dr. Gladis here for acute visit. For the past two weeks having coughing, white mucus, sometimes brown or orange. Treated with abx by PCP Dr. Sebastian. Received doxycycline and prednisone . Having trouble sleeping at night but is taking promethazine  cough syrup. He does have an albuterol  inhaler which he uses 2-3 times/day. He doesn't feel like it helps very much. He does have a nebulizer machine but doesn't know how to use it.   He was given a nasal spray but doesn't know the name of it. He doesn't think it's helping.   Had CT Chest Jul 20 2023 which showed cluster of nodules concerning for MAI infection. Sent here for acute visit  No fevers chills night sweats or weight loss. Appetite is decreased but keeping down fluids, no nausea, vomiting, diarrhea.    I have reviewed the patient's family social and past medical history and updated as appropriate.    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  Most of these should go away with the antibiotics you are just prescribed by Dr. Sebastian.  There is a chance it could be an atypical bacterial infection called Mycobacterium.  Patients that are older, and have a history of smoking are more at risk for developing these infections.  At this time you do not have any signs or symptoms of chronic respiratory illness so I think this is much  less likely.  I recommend repeating a CT scan in 8 weeks to see if this pneumonia has cleared up.  In the meantime if you are able to bring the sputum culture please do so with the instructions below.  I need two samples.  There is already a CT scan ordered for end of July so you should keep that appointment.   You can use the albuterol  inhaler as needed for symptoms of chest pain, chest tightness, wheezing, shortness of breath.  The albuterol  inhaler is not going to help with symptoms of sinus congestion and nasal drainage.   Most of your cough is related to sinus congestion and nasal drainage from the recent infection.  I recommend taking nasal saline spray over-the-counter as needed during the day.  You can take this multiple times a day.  I also recommend starting Flonase  nasal spray 1 spray each side your nose twice daily.  You can also take an antihistamine like Claritin  or Allegra to help dry up the drainage.  This will help with coughing.  You can continue the promethazine  cough syrup as well.  10/21/2023- Interim hx  Discussed the use of AI scribe software for clinical note transcription with the patient, who gave verbal consent to proceed.  History of Present Illness David [name] is an 89 year old male who presents for follow-up after treatment for pneumonia.  He was treated for pneumonia in June with doxycycline and prednisone . A follow-up CT scan in July showed  resolution of tree-in-bud nodules.  He feels 'all right' and has returned to his baseline health. No acute symptoms such as changes in breathing or cough.  He uses a rescue inhaler sporadically, only when necessary, and not on a daily or weekly basis. He also uses Flonase  nasal spray as needed. He does not take Claritin .  He experienced weight loss during the pneumonia but has since regained part of the weight. No night sweats or further weight loss.  His heart rate typically runs around 55 beats per minute. No dizziness,  lightheadedness, or fainting, except in situations involving heat. He is under the care of a cardiologist.    No Known Allergies  Immunization History  Administered Date(s) Administered   Hep A / Hep B 02/20/2014, 03/22/2014   Hep A, Unspecified 05/28/2006, 02/20/2014, 03/22/2014   Hep B, Unspecified 02/20/2014, 03/22/2014   Hepatitis A, Ped/Adol-2 Dose 05/28/2006, 02/20/2014, 03/22/2014   Hepatitis B, PED/ADOLESCENT 02/20/2014, 03/22/2014   Influenza-Unspecified 12/21/2003, 01/02/2005, 01/07/2007, 12/10/2016   Janssen (J&J) SARS-COV-2 Vaccination 08/01/2019   Moderna Sars-Covid-2 Vaccination 05/09/2019, 06/07/2019   PNEUMOCOCCAL CONJUGATE-20 04/06/2022   Pneumococcal Conjugate-13 01/09/2014   Pneumococcal Polysaccharide-23 05/07/2015   Td (Adult),5 Lf Tetanus Toxid, Preservative Free 10/21/2004   Tdap 05/28/2006, 02/20/2014   Typhoid Live 02/20/2014   Yellow Fever 03/22/2014   Zoster Recombinant(Shingrix) 10/09/2016, 12/10/2016    Past Medical History:  Diagnosis Date   Acute hypoxic respiratory failure (HCC) 02/26/2022   BBB (bundle branch block) 11/12/2022   Benign prostatic hyperplasia with lower urinary tract symptoms 11/12/2022   Bradycardia 11/12/2022   Chronic fatigue 11/12/2022   Community acquired pneumonia of left lower lobe of lung 02/26/2022   Dysphagia 05/08/2022   Empyema (HCC) 02/28/2022   Feels cold 01/14/2023   History of colon cancer 12/17/2022   History of pneumonia 01/14/2023   HTN (hypertension) 02/26/2022   Hypertension    Memory loss 11/12/2022   Mixed hyperlipidemia 11/12/2022   Normocytic anemia 02/26/2022   Pleural effusion 02/26/2022   Protein-calorie malnutrition, severe 03/02/2022   Proteinuria 11/12/2022   Pulmonary nodule 01/14/2023   Rectus diastasis 01/04/2012   Recurrent pneumonia 02/27/2022   Solar lentigo 01/14/2023   Spinal stenosis of lumbar region without neurogenic claudication 11/12/2022   Stage 3a chronic kidney disease  (CKD) (HCC) 02/26/2022   Stenosis of right carotid artery 11/12/2022   Vitamin D  deficiency 12/17/2022    Tobacco History: Social History   Tobacco Use  Smoking Status Former   Current packs/day: 0.00   Average packs/day: 2.0 packs/day for 20.0 years (40.0 ttl pk-yrs)   Types: Cigarettes   Start date: 02/24/1948   Quit date: 02/24/1968   Years since quitting: 55.6   Passive exposure: Never  Smokeless Tobacco Never   Counseling given: Not Answered   Outpatient Medications Prior to Visit  Medication Sig Dispense Refill   acetaminophen  (QC ACETAMINOPHEN  8 HOURS) 650 MG CR tablet Take 1 tablet (650 mg total) by mouth every 8 (eight) hours as needed for pain. 90 tablet 0   albuterol  (VENTOLIN  HFA) 108 (90 Base) MCG/ACT inhaler Inhale 2 puffs into the lungs.     Amino Acids (AMINO ACID PO) Take 5 tablets by mouth daily. (Patient not taking: Reported on 09/17/2023)     amLODipine  (NORVASC ) 10 MG tablet TAKE 1 TABLET BY MOUTH AT BEDTIME 90 tablet 0   Ascorbic Acid (VITAMIN C) 1000 MG tablet Take 1,000 mg by mouth daily.     aspirin  EC 81 MG tablet Take 81 mg  by mouth in the morning. Swallow whole. (Patient not taking: Reported on 09/17/2023)     B Complex Vitamins (VITAMIN B-COMPLEX) TABS Take 1 tablet by mouth every morning.     Cholecalciferol (VITAMIN D3) 50 MCG (2000 UT) TABS Take 2,000 Units by mouth daily.     Coenzyme Q10 (CO Q10 PO) Take 30 mLs by mouth daily.     Cyanocobalamin (VITAMIN B-12) 2500 MCG SUBL Place 2,500 mcg under the tongue daily.     diclofenac  Sodium (VOLTAREN ) 1 % GEL Apply 4 g topically 4 (four) times daily as needed. 100 g 3   finasteride (PROSCAR) 5 MG tablet Take 5 mg by mouth daily.     fluticasone  (FLONASE ) 50 MCG/ACT nasal spray Place 1 spray into both nostrils daily. 16 g 3   GARLIC PO Take by mouth.     hydrALAZINE  (APRESOLINE ) 10 MG tablet Take 10 mg (1 table)  3 times day if BP > 160/100 270 tablet 3   loratadine  (CLARITIN ) 10 MG tablet Take 1 tablet (10  mg total) by mouth daily. (Patient not taking: Reported on 09/17/2023) 30 tablet 11   MAGNESIUM GLYCINATE PO Take 240 mg by mouth daily.     olmesartan -hydrochlorothiazide (BENICAR  HCT) 40-25 MG tablet Take 1 tablet by mouth daily. 90 tablet 3   Omega-3 Fatty Acids (FISH OIL PO) Take 1 capsule by mouth daily.     OVER THE COUNTER MEDICATION Joint liquid 2,00 glucosamine, 200 chondrotin     pantoprazole  (PROTONIX ) 40 MG tablet Take 1 tablet by mouth once daily 90 tablet 0   Probiotic Product (PROBIOTIC PO) Take 1 capsule by mouth daily.     promethazine -dextromethorphan (PROMETHAZINE -DM) 6.25-15 MG/5ML syrup Take 5 mLs by mouth 4 (four) times daily as needed for cough. (Patient not taking: Reported on 09/17/2023) 118 mL 0   rosuvastatin  (CRESTOR ) 5 MG tablet Take 1 tablet (5 mg total) by mouth daily. 90 tablet 3   spironolactone  (ALDACTONE ) 25 MG tablet Take 25 mg by mouth daily.     TURMERIC CURCUMIN PO Take 1 tablet by mouth daily.     Zinc  50 MG TABS Take 50 mg by mouth daily.     No facility-administered medications prior to visit.   Review of Systems  Review of Systems  Constitutional: Negative.  Negative for fatigue and unexpected weight change.  Respiratory: Negative.  Negative for cough and shortness of breath.   Neurological:  Negative for dizziness and light-headedness.   Physical Exam  There were no vitals taken for this visit. Physical Exam Constitutional:      General: He is not in acute distress.    Appearance: Normal appearance. He is not ill-appearing.  HENT:     Head: Normocephalic and atraumatic.     Mouth/Throat:     Mouth: Mucous membranes are moist.     Pharynx: Oropharynx is clear.  Cardiovascular:     Rate and Rhythm: Normal rate and regular rhythm.  Pulmonary:     Effort: Pulmonary effort is normal.     Breath sounds: Normal breath sounds. No wheezing, rhonchi or rales.  Skin:    General: Skin is warm and dry.  Neurological:     General: No focal deficit  present.     Mental Status: He is alert and oriented to person, place, and time. Mental status is at baseline.  Psychiatric:        Mood and Affect: Mood normal.        Behavior: Behavior normal.  Thought Content: Thought content normal.        Judgment: Judgment normal.     Lab Results:  CBC    Component Value Date/Time   WBC 8.7 07/20/2023 0936   RBC 4.70 07/20/2023 0936   HGB 13.8 07/20/2023 0936   HCT 40.7 07/20/2023 0936   PLT 299.0 07/20/2023 0936   MCV 86.6 07/20/2023 0936   MCH 27.6 03/09/2022 0406   MCHC 33.8 07/20/2023 0936   RDW 14.7 07/20/2023 0936   LYMPHSABS 0.6 (L) 07/20/2023 0936   MONOABS 0.8 07/20/2023 0936   EOSABS 0.0 07/20/2023 0936   BASOSABS 0.0 07/20/2023 0936    BMET    Component Value Date/Time   NA 136 07/20/2023 0936   K 4.4 07/20/2023 0936   CL 104 07/20/2023 0936   CO2 22 07/20/2023 0936   GLUCOSE 95 07/20/2023 0936   BUN 40 (H) 07/20/2023 0936   CREATININE 1.77 (H) 07/20/2023 0936   CALCIUM  9.7 07/20/2023 0936   GFRNONAA >60 03/10/2022 0354   GFRAA 54 (L) 09/09/2017 1421    BNP No results found for: BNP  ProBNP    Component Value Date/Time   PROBNP 68.0 07/20/2023 0936    Imaging: VAS US  RENAL ARTERY DUPLEX Result Date: 09/22/2023 ABDOMINAL VISCERAL Patient Name:  David [name]  Date of Exam:   09/21/2023 Medical Rec #: [id number]      Accession #:    [id number] Date of Birth: May 18, 1933      Patient Gender: M Patient Age:   68 years Exam Location:  [address] Procedure:      VAS US  RENAL ARTERY DUPLEX Referring Phys: [id number] BEVERLEY KATHEE HUMMER -------------------------------------------------------------------------------- Indications: primary hypertension High Risk Factors: Hypertension, hyperlipidemia, past history of smoking,                    coronary artery disease. Comparison Study: N/A Performing Technologist: Dena Pane  Examination Guidelines: A complete evaluation includes B-mode imaging, spectral Doppler,  color Doppler, and power Doppler as needed of all accessible portions of each vessel. Bilateral testing is considered an integral part of a complete examination. Limited examinations for reoccurring indications may be performed as noted.  Duplex Findings: +--------------------+--------+--------+------+--------+ Mesenteric          PSV cm/sEDV cm/sPlaqueComments +--------------------+--------+--------+------+--------+ Aorta Prox            137                          +--------------------+--------+--------+------+--------+ Aorta Distal          184                          +--------------------+--------+--------+------+--------+ Celiac Artery Origin  330                          +--------------------+--------+--------+------+--------+ SMA Proximal          260                          +--------------------+--------+--------+------+--------+    +------------------+--------+--------+-------+ Right Renal ArteryPSV cm/sEDV cm/sComment +------------------+--------+--------+-------+ Origin              141      41           +------------------+--------+--------+-------+ Proximal            123  23           +------------------+--------+--------+-------+ Mid                 213      22           +------------------+--------+--------+-------+ Distal              168      17           +------------------+--------+--------+-------+ +-----------------+--------+--------+-------+ Left Renal ArteryPSV cm/sEDV cm/sComment +-----------------+--------+--------+-------+ Origin             126      22           +-----------------+--------+--------+-------+ Proximal           187      13           +-----------------+--------+--------+-------+ Mid                 85      18           +-----------------+--------+--------+-------+ Distal              87      14           +-----------------+--------+--------+-------+  Technologist observations: Technically  difficult exam due to patient respirations. +------------+--------+--------+----+-----------+--------+--------+----+ Right KidneyPSV cm/sEDV cm/sRI  Left KidneyPSV cm/sEDV cm/sRI   +------------+--------+--------+----+-----------+--------+--------+----+ Upper Pole  26      3       0.88Upper Pole 26      5       0.83 +------------+--------+--------+----+-----------+--------+--------+----+ Mid         31      4       0.        29      4       0.86 +------------+--------+--------+----+-----------+--------+--------+----+ Lower Pole  26      4       0.86Lower Pole 28      4       0.86 +------------+--------+--------+----+-----------+--------+--------+----+ Hilar       49      7       0.86Hilar      46      7       0.85 +------------+--------+--------+----+-----------+--------+--------+----+ +------------------+-----+------------------+-----+ Right Kidney           Left Kidney             +------------------+-----+------------------+-----+ RAR                    RAR                     +------------------+-----+------------------+-----+ RAR (manual)      1.6  RAR (manual)      1.4   +------------------+-----+------------------+-----+ Cortex            15   Cortex            16    +------------------+-----+------------------+-----+ Cortex thickness       Corex thickness         +------------------+-----+------------------+-----+ Kidney length (cm)13.60Kidney length (cm)13.10 +------------------+-----+------------------+-----+  Summary: Renal:  Right: Normal size right kidney. RRV flow present. 1-59% stenosis of        the right renal artery. Abnormal right Resistive Index. Left:  Normal size of left kidney. LRV flow present. No evidence of        left renal artery stenosis. Abnormal left Resisitve Index. Mesenteric: Normal Superior Mesenteric artery findings.  70 to 99% stenosis in the celiac artery.  *See table(s) above for measurements and  observations.  Diagnosing physician: Deatrice Cage MD  Electronically signed by Deatrice Cage MD on 09/22/2023 at 1:06:59 PM.    Final      Assessment & Plan:    1. Pulmonary nodule (Primary)  Assessment & Plan CAP Treated for pneumonia in June 2025. Minimal scarring in the lungs on CT scan. Previous nodules in the right middle lobe have resolved. Lungs are otherwise clear. No acute respiratory symptoms. Cough has resolved. Breathing is at baseline. - Follow up in 6 to 12 months unless symptoms change - Advise to return if coughing up colored mucus or if breathing worsens  Bradycardia Heart rate is 47 bpm, typically between 45-55 bpm. Last EKG showed heart rate at 57 bpm. On antihypertensive medications. No dizziness, lightheadedness, or syncope reported unless in heat.  - Recheck heart rate. Perform EKG if heart rate is less than 50 bpm    Almarie [name] Ferrari, NP 10/21/2023

## 2023-10-22 ENCOUNTER — Ambulatory Visit: Payer: Self-pay | Admitting: Family Medicine

## 2023-10-22 NOTE — Progress Notes (Signed)
 Spoke to patient while in clinic with his wife OV with Dr. Sebastian MD. I went over results and plan. Patient verbalized understanding. OV is scheduled.

## 2023-10-29 ENCOUNTER — Encounter: Payer: Self-pay | Admitting: Family Medicine

## 2023-10-29 ENCOUNTER — Ambulatory Visit: Payer: Self-pay

## 2023-10-29 ENCOUNTER — Ambulatory Visit: Admitting: Family Medicine

## 2023-10-29 VITALS — BP 138/60 | HR 61 | Temp 97.9°F | Ht 71.0 in | Wt 191.8 lb

## 2023-10-29 DIAGNOSIS — U071 COVID-19: Secondary | ICD-10-CM

## 2023-10-29 LAB — POC COVID19 BINAXNOW: SARS Coronavirus 2 Ag: POSITIVE — AB

## 2023-10-29 MED ORDER — NIRMATRELVIR/RITONAVIR (PAXLOVID) TABLET (RENAL DOSING): 2.0000 | ORAL_TABLET | Freq: Two times a day (BID) | ORAL | 0 refills | Status: AC

## 2023-10-29 NOTE — Telephone Encounter (Signed)
 FYI Only or Action Required?: FYI only for provider.  Patient was last seen in primary care on 09/29/2023 by Sebastian Beverley NOVAK, MD.  Called Nurse Triage reporting Cough.  Symptoms began 1-2 days ago.  Interventions attempted: OTC medications: cough syrup and Prescription medications: albuterol  inhaler.  Symptoms are: mild SOB at night or with exertion; productive cough with clear mucus, sore throat, intermittent wheezing, chest congestion gradually worsening.  Triage Disposition: See HCP Within 4 Hours (Or PCP Triage)  Patient/caregiver understands and will follow disposition?: Yes         Copied from CRM [id number]. Topic: Clinical - Pink Word Triage >> Oct 29, 2023 10:13 AM [place] E wrote: Reason for Triage: Patient's wife called in stating her husband has had a cough for 2 days and a fever of 99.5. Wife has appointment today at 4pm and she was wanting her husband seen around the same time, but agent could not populate that timeframe for patient's wife. Callback number for patient is [phone]. Reason for Disposition  [1] MILD difficulty breathing (e.g., minimal/no SOB at rest, SOB with walking, pulse < 100) AND [2] still present when not coughing  Answer Assessment - Initial Assessment Questions Patient's wife originally called in requesting for patient to be seen at 4pm due to she is also scheduled for that time. No available appts at clinic during that time. Offered only available appt to patient with Dr Thedora at 1pm and patient is agreeable.  1. ONSET: When did the cough begin?      2 days ago, mostly yesterday.  2. SEVERITY: How bad is the cough today?      During the night he states it is a 8/10. He states he feels bad during the day and feels bad right now.  3. SPUTUM: Describe the color of your sputum (e.g., none, dry cough; clear, white, yellow, green)     Clear.  4. HEMOPTYSIS: Are you coughing up any blood? If Yes, ask: How much? (e.g., flecks,  streaks, tablespoons, etc.)     No.  5. DIFFICULTY BREATHING: Are you having difficulty breathing? If Yes, ask: How bad is it? (e.g., mild, moderate, severe)      He states he has difficulty breathing, SOB at night. He states right now at rest he does not feel SOB. Mild SOB.  6. FEVER: Do you have a fever? If Yes, ask: What is your temperature, how was it measured, and when did it start?     Highest temperature was 99.5.  7. CARDIAC HISTORY: Do you have any history of heart disease? (e.g., heart attack, congestive heart failure)   HTN (hypertension),Stenosis of right carotid artery, BBB (bundle branch block), Labile hypertension, Coronary artery disease involving native coronary artery of native heart without angina pectoris, Senile purpura (HCC), Aortic atherosclerosis (HCC)    8. LUNG HISTORY: Do you have any history of lung disease?  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 PE RISK FACTORS: Do you have a history of blood clots? (or: recent major surgery, recent prolonged travel, bedridden)     No.  10. OTHER SYMPTOMS: Do you have any other symptoms? (e.g., runny nose, wheezing, chest pain)       Sore throat, intermittent wheezing, chest congestion. Denies chest pain, earaches.  11. PREGNANCY: Is there any chance  you are pregnant? When was your last menstrual period?       N/A.  12. TRAVEL: Have you traveled out of the country in the last month? (e.g., travel history, exposures)       No.  Protocols used: Cough - Acute Productive-A-AH

## 2023-10-29 NOTE — Progress Notes (Signed)
 [facility] [address] [place] [address] Dept: [phone] Dept Fax: [phone]  Office Visit  Subjective:    Patient ID: David [name], male    DOB: Nov 07, 1933, 89 y.o..   MRN: [id number]  Chief Complaint  Patient presents with   Cough    C/o having a cough x weeks and now having very ST.  Has been taking cough syrup.     History of Present Illness:  Patient is in today with a 2-day history of increased cough with associated body aches, malaise, decreased appetite and sore throat. He notes the throat makes it difficult to swallow. He denies any fever, nasal congestion, N/V/D. David [name] has a prior history of pneumonia and is worried this might recur.  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) 07/21/2023   Labile hypertension 07/21/2023   Stage 3b chronic kidney disease (HCC) 07/13/2023   Solar lentigo 01/14/2023   Feels cold 01/14/2023   History of pneumonia 01/14/2023   Pulmonary nodule 01/14/2023   Vitamin D  deficiency 12/17/2022   History of colon cancer 12/17/2022   Stenosis of right carotid artery 11/12/2022   Proteinuria 11/12/2022   Chronic fatigue 11/12/2022   Spinal stenosis of lumbar region without neurogenic claudication 11/12/2022   Bradycardia 11/12/2022   Mixed hyperlipidemia 11/12/2022   Benign prostatic hyperplasia with lower urinary tract symptoms 11/12/2022   Memory loss 11/12/2022   BBB (bundle branch block) 11/12/2022   Dysphagia 05/08/2022   Protein-calorie malnutrition, severe 03/02/2022   Empyema (HCC) 02/28/2022   Recurrent pneumonia 02/27/2022   Community acquired pneumonia of  left lower lobe of lung 02/26/2022   Acute hypoxic respiratory failure (HCC) 02/26/2022   Pleural effusion 02/26/2022   Stage 3a chronic kidney disease (CKD) (HCC) 02/26/2022   HTN (hypertension) 02/26/2022   Normocytic anemia 02/26/2022   Rectus diastasis 01/04/2012   Past Surgical History:  Procedure Laterality Date   COLON SURGERY     KNEE SURGERY  2009 or 2010   replaced knee cap   PROSTATE SURGERY  2012   Family History  Problem Relation Age of Onset   Cancer Mother        breast   Heart disease Father        heart attack at age 76   Outpatient Medications Prior to Visit  Medication Sig Dispense Refill   acetaminophen  (QC ACETAMINOPHEN  8 HOURS) 650 MG CR tablet Take 1 tablet (650 mg total) by mouth every 8 (eight) hours as needed for pain. 90 tablet 0   albuterol  (VENTOLIN  HFA) 108 (90 Base) MCG/ACT inhaler Inhale 2 puffs into the lungs.     Amino Acids (AMINO ACID PO) Take 5 tablets by mouth daily.     amLODipine  (NORVASC ) 10 MG tablet TAKE 1 TABLET BY MOUTH AT BEDTIME 90 tablet 0   Ascorbic Acid (VITAMIN C) 1000 MG tablet Take 1,000 mg by mouth daily.     aspirin  EC 81 MG tablet Take 81 mg by mouth in the morning. Swallow whole.     B Complex Vitamins (VITAMIN B-COMPLEX) TABS Take 1 tablet by mouth every morning.     Cholecalciferol (VITAMIN D3) 50 MCG (2000 UT) TABS Take  2,000 Units by mouth daily.     Coenzyme Q10 (CO Q10 PO) Take 30 mLs by mouth daily.     Cyanocobalamin (VITAMIN B-12) 2500 MCG SUBL Place 2,500 mcg under the tongue daily.     diclofenac  Sodium (VOLTAREN ) 1 % GEL Apply 4 g topically 4 (four) times daily as needed. 100 g 3   finasteride (PROSCAR) 5 MG tablet Take 5 mg by mouth daily.     fluticasone  (FLONASE ) 50 MCG/ACT nasal spray Place 1 spray into both nostrils daily. 16 g 3   GARLIC PO Take by mouth.     hydrALAZINE  (APRESOLINE ) 10 MG tablet Take 10 mg (1 table)  3 times day if BP > 160/100 270 tablet 3   loratadine  (CLARITIN ) 10 MG tablet Take 1  tablet (10 mg total) by mouth daily. 30 tablet 11   MAGNESIUM GLYCINATE PO Take 240 mg by mouth daily.     olmesartan -hydrochlorothiazide (BENICAR  HCT) 40-25 MG tablet Take 1 tablet by mouth daily. 90 tablet 3   Omega-3 Fatty Acids (FISH OIL PO) Take 1 capsule by mouth daily.     OVER THE COUNTER MEDICATION Joint liquid 2,00 glucosamine, 200 chondrotin     pantoprazole  (PROTONIX ) 40 MG tablet Take 1 tablet by mouth once daily 90 tablet 0   Probiotic Product (PROBIOTIC PO) Take 1 capsule by mouth daily.     promethazine -dextromethorphan (PROMETHAZINE -DM) 6.25-15 MG/5ML syrup Take 5 mLs by mouth 4 (four) times daily as needed for cough. 118 mL 0   rosuvastatin  (CRESTOR ) 5 MG tablet Take 1 tablet (5 mg total) by mouth daily. 90 tablet 3   spironolactone  (ALDACTONE ) 25 MG tablet Take 25 mg by mouth daily.     TURMERIC CURCUMIN PO Take 1 tablet by mouth daily.     Zinc  50 MG TABS Take 50 mg by mouth daily.     No facility-administered medications prior to visit.   No Known Allergies   Objective:   Today's Vitals   10/29/23 1257  BP: 138/60  Pulse: 61  Temp: 97.9 F (36.6 C)  TempSrc: Temporal  SpO2: 93%  Weight: 191 lb 12.8 oz (87 kg)  Height: 5' 11 (1.803 m)   Body mass index is 26.75 kg/m.   General: Well developed, well nourished. No acute distress. HEENT: Normocephalic, non-traumatic. PERRL, EOMI. Conjunctiva clear. External ears normal. EAC and TMs normal   bilaterally. Nose clear without congestion or rhinorrhea. Mucous membranes moist. Moderate redness of the posterior   oropharynx clear. Good dentition. Neck: Supple. No lymphadenopathy. No thyromegaly. Lungs: Clear to auscultation bilaterally. No wheezing, rales or rhonchi. CV: RRR without murmurs or rubs. Pulses 2+ bilaterally. Psych: Alert and oriented. Normal mood and affect.  There are no preventive care reminders to display for this patient.  Lab Results POCT Covid: Positive    Assessment & Plan:   Problem  List Items Addressed This Visit       Other   COVID-19 - Primary   Reviewed home care instructions for COVID, including rest, pushing fluids, and OTC medications as needed for symptom relief. Recommend hot tea with honey for sore throat symptoms. I will prescribe Paxlovid . If symptoms, esp, dyspnea develops/worsens, recommend in-person evaluation at either an urgent care or the emergency room.       Relevant Medications   nirmatrelvir /ritonavir , renal dosing, (PAXLOVID ) 10 x 150 MG & 10 x 100MG  TABS   Other Relevant Orders   POC COVID-19 BinaxNow (Completed)    Return if symptoms worsen  or fail to improve.   Garnette CHRISTELLA Simpler, MD

## 2023-10-29 NOTE — Patient Instructions (Addendum)
 Hold off on taking rosuvastatin  while on Paxlovid  Go to ED if oxygen level drops under 90%.

## 2023-10-29 NOTE — Telephone Encounter (Signed)
 Noted

## 2023-10-29 NOTE — Assessment & Plan Note (Signed)
 Reviewed home care instructions for COVID, including rest, pushing fluids, and OTC medications as needed for symptom relief. Recommend hot tea with honey for sore throat symptoms. I will prescribe Paxlovid . If symptoms, esp, dyspnea develops/worsens, recommend in-person evaluation at either an urgent care or the emergency room.

## 2023-11-01 ENCOUNTER — Other Ambulatory Visit: Payer: Self-pay

## 2023-11-01 MED ORDER — FINASTERIDE 5 MG PO TABS: 5.0000 mg | ORAL_TABLET | Freq: Every day | ORAL | 0 refills | Status: DC

## 2023-11-03 ENCOUNTER — Ambulatory Visit (INDEPENDENT_AMBULATORY_CARE_PROVIDER_SITE_OTHER): Admitting: Family Medicine

## 2023-11-03 ENCOUNTER — Ambulatory Visit: Payer: Self-pay | Admitting: Family Medicine

## 2023-11-03 VITALS — BP 140/50 | HR 57 | Temp 97.2°F | Resp 18 | Wt 186.2 lb

## 2023-11-03 DIAGNOSIS — U071 COVID-19: Secondary | ICD-10-CM

## 2023-11-03 DIAGNOSIS — I1A0 Resistant hypertension: Secondary | ICD-10-CM

## 2023-11-03 DIAGNOSIS — E782 Mixed hyperlipidemia: Secondary | ICD-10-CM

## 2023-11-03 DIAGNOSIS — I700 Atherosclerosis of aorta: Secondary | ICD-10-CM

## 2023-11-03 DIAGNOSIS — I2510 Atherosclerotic heart disease of native coronary artery without angina pectoris: Secondary | ICD-10-CM

## 2023-11-03 DIAGNOSIS — N1831 Chronic kidney disease, stage 3a: Secondary | ICD-10-CM

## 2023-11-03 DIAGNOSIS — E875 Hyperkalemia: Secondary | ICD-10-CM

## 2023-11-03 LAB — COMPREHENSIVE METABOLIC PANEL WITH GFR
ALT: 13 U/L (ref 0–53)
AST: 19 U/L (ref 0–37)
Albumin: 4.5 g/dL (ref 3.5–5.2)
Alkaline Phosphatase: 42 U/L (ref 39–117)
BUN: 27 mg/dL — ABNORMAL HIGH (ref 6–23)
CO2: 28 meq/L (ref 19–32)
Calcium: 9.9 mg/dL (ref 8.4–10.5)
Chloride: 101 meq/L (ref 96–112)
Creatinine, Ser: 1.84 mg/dL — ABNORMAL HIGH (ref 0.40–1.50)
GFR: 32.32 mL/min — ABNORMAL LOW (ref >60.00)
Glucose, Bld: 96 mg/dL (ref 70–99)
Potassium: 5.3 meq/L — ABNORMAL HIGH (ref 3.5–5.1)
Sodium: 136 meq/L (ref 135–145)
Total Bilirubin: 0.8 mg/dL (ref 0.2–1.2)
Total Protein: 7.5 g/dL (ref 6.0–8.3)

## 2023-11-03 LAB — LIPID PANEL
Cholesterol: 138 mg/dL (ref 0–200)
HDL: 49.5 mg/dL (ref >39.00)
LDL Cholesterol: 61 mg/dL (ref 0–99)
NonHDL: 88.96
Total CHOL/HDL Ratio: 3
Triglycerides: 140 mg/dL (ref 0.0–149.0)
VLDL: 28 mg/dL (ref 0.0–40.0)

## 2023-11-03 MED ORDER — PROMETHAZINE-DM 6.25-15 MG/5ML PO SYRP: 5.0000 mL | ORAL_SOLUTION | Freq: Four times a day (QID) | ORAL | 0 refills | Status: AC | PRN

## 2023-11-03 MED ORDER — FLUTICASONE PROPIONATE 50 MCG/ACT NA SUSP
1.0000 | Freq: Every day | NASAL | 3 refills | Status: AC
Start: 2023-11-03 — End: ?

## 2023-11-03 MED ORDER — BENZONATATE 200 MG PO CAPS: 200.0000 mg | ORAL_CAPSULE | Freq: Three times a day (TID) | ORAL | 0 refills | Status: AC | PRN

## 2023-11-03 NOTE — Progress Notes (Signed)
 Assessment & Plan   Assessment/Plan:     Assessment & Plan COVID-19 infection with persistent cough COVID-19 infection diagnosed on October 29, 2023, with persistent cough causing fits, especially at night. Shortness of breath reported but improving. No chest pain or fever. Oxygen levels are stable. Nasal drainage present. - Prescribe benzonatate  200 mg three times a day for cough - Refill promethazine  DM syrup for cough - Refill Flonase  for nasal drainage - Advise to stay hydrated - Advise to return if symptoms do not improve in a week - Complete Paxlovid  course  Resistant hypertension Blood pressure at baseline is 140/50 mmHg with a wide pulse pressure. Continues to follow with cardiology advanced hypertensive clinic. Current antihypertensive regimen includes amlodipine , hydralazine , olmesartan -hydrochlorothiazide, and spironolactone . - Continue current antihypertensive medications  Coronary artery disease and aortic atherosclerosis Mild coronary artery disease and aortic atherosclerosis. On aspirin  81 mg daily for secondary prevention of cardiovascular disease.  Hyperlipidemia On rosuvastatin  5 mg daily for hyperlipidemia and secondary prevention of cardiovascular disease. Lipid goal is under 100 mg/dL, with an ideal goal under 70 mg/dL or even under 50 mg/dL. - Order fasting lipid panel - Order complete metabolic panel to check liver function  Chronic kidney disease, stage 3a Chronic kidney disease stage 3a, borderline stage 3b. - Order complete metabolic panel to check creatinine and blood sugar      Medications Discontinued During This Encounter  Medication Reason   promethazine -dextromethorphan (PROMETHAZINE -DM) 6.25-15 MG/5ML syrup Reorder   fluticasone  (FLONASE ) 50 MCG/ACT nasal spray Reorder    Return in about 6 months (around 05/02/2024) for fasting labs, HLD, BP.        Subjective:   Encounter date: 11/03/2023  David [name] is a 89 y.o. male who has  Rectus diastasis; Community acquired pneumonia of left lower lobe of lung; Acute hypoxic respiratory failure (HCC); Pleural effusion; Stage 3a chronic kidney disease (CKD) (HCC); HTN (hypertension); Normocytic anemia; Recurrent pneumonia; Empyema (HCC); Protein-calorie malnutrition, severe; Dysphagia; Stenosis of right carotid artery; Proteinuria; Chronic fatigue; Spinal stenosis of lumbar region without neurogenic claudication; Bradycardia; Mixed hyperlipidemia; Benign prostatic hyperplasia with lower urinary tract symptoms; Memory loss; BBB (bundle branch block); Vitamin D  deficiency; History of colon cancer; Solar lentigo; Feels cold; History of pneumonia; Pulmonary nodule; Stage 3b chronic kidney disease (HCC); 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   He  has a past medical history of Acute hypoxic respiratory failure (HCC) (02/26/2022), BBB (bundle branch block) (11/12/2022), Benign prostatic hyperplasia with lower urinary tract symptoms (11/12/2022), Bradycardia (11/12/2022), Chronic fatigue (11/12/2022), Community acquired pneumonia of left lower lobe of lung (02/26/2022), Dysphagia (05/08/2022), Empyema (HCC) (02/28/2022), Feels cold (01/14/2023), History of colon cancer (12/17/2022), History of pneumonia (01/14/2023), HTN (hypertension) (02/26/2022), Hypertension, Memory loss (11/12/2022), Mixed hyperlipidemia (11/12/2022), Normocytic anemia (02/26/2022), Pleural effusion (02/26/2022), Protein-calorie malnutrition, severe (03/02/2022), Proteinuria (11/12/2022), Pulmonary nodule (01/14/2023), Rectus diastasis (01/04/2012), Recurrent pneumonia (02/27/2022), Solar lentigo (01/14/2023), Spinal stenosis of lumbar region without neurogenic claudication (11/12/2022),  Stage 3a chronic kidney disease (CKD) (HCC) (02/26/2022), Stenosis of right carotid artery (11/12/2022), and Vitamin D  deficiency (12/17/2022).[name]   He presents with chief complaint of Hyperlipidemia, Hypertension (2 month follow up. Pt is fasting today; Pt have at home BP readings in clinic //HM due-  flu vaccine (patient declined) ), and Medication Refill (RX refill request for Benzonatate  100MG  for cough ) .  Discussed the use of AI scribe software for clinical note transcription with the patient, who gave verbal consent to proceed.  History of Present Illness David [name] is an 89 year old male with hyperlipidemia, coronary artery disease, and resistant hypertension who presents for follow-up.  Hypertension and cardiovascular disease - Resistant hypertension with baseline blood pressure of 140/50 mmHg and wide pulse pressure - Coronary artery disease and aortic atherosclerosis - Follows with cardiology advanced hypertensive clinic for primary and secondary prevention of cardiovascular disease - Current antihypertensive regimen: amlodipine  10 mg daily, hydralazine  10 mg, olmesartan -hydrochlorothiazide 40-25 mg daily, spironolactone  25 mg daily - Hyperlipidemia managed with rosuvastatin  5 mg daily - Aspirin  81 mg daily for cardiovascular secondary prevention  Chronic kidney disease - Chronic kidney disease stage 3A, borderline B - Last GFR noted (value not specified)  Acute respiratory symptoms post-covid-19 infection - Tested positive for COVID-19 on October 29, 2023 - Completed a course of Paxlovid  - Persistent cough, especially at night, sometimes causing 'a bad fit' - Cough managed with benzonatate  200 mg three times a day and promethazine  DM syrup - Uses an inhaler nightly with some relief - Shortness of breath and nasal drainage - No chest pain, fever, or chills - Previously used Flonase  for nasal symptoms but has run out - Increased water  intake, especially with  medications     ROS  Past Surgical History:  Procedure Laterality Date   COLON SURGERY     KNEE SURGERY  2009 or 2010   replaced knee cap   PROSTATE SURGERY  2012    Outpatient Medications Prior to Visit  Medication Sig Dispense Refill   acetaminophen  (QC ACETAMINOPHEN  8 HOURS) 650 MG CR tablet Take 1 tablet (650 mg total) by mouth every 8 (eight) hours as needed for pain. 90 tablet 0   albuterol  (VENTOLIN  HFA) 108 (90 Base) MCG/ACT inhaler Inhale 2 puffs into the lungs.     Amino Acids (AMINO ACID PO) Take 5 tablets by mouth daily.     amLODipine  (NORVASC ) 10 MG tablet TAKE 1 TABLET BY MOUTH AT BEDTIME 90 tablet 0   Ascorbic Acid (VITAMIN C) 1000 MG tablet Take 1,000 mg by mouth daily.     aspirin  EC 81 MG tablet Take 81 mg by mouth in the morning. Swallow whole.     B Complex Vitamins (VITAMIN B-COMPLEX) TABS Take 1 tablet by mouth every morning.     Cholecalciferol (VITAMIN D3) 50 MCG (2000 UT) TABS Take 2,000 Units by mouth daily.     Coenzyme Q10 (CO Q10 PO) Take 30 mLs by mouth daily.     Cyanocobalamin (VITAMIN B-12) 2500 MCG SUBL Place 2,500 mcg under the tongue daily.     diclofenac  Sodium (VOLTAREN ) 1 % GEL Apply 4 g topically 4 (four) times daily as needed. 100 g 3   finasteride  (PROSCAR ) 5 MG tablet Take 1 tablet (5 mg total) by mouth daily. 90 tablet 0   GARLIC PO Take by mouth.     hydrALAZINE  (APRESOLINE ) 10 MG tablet Take 10 mg (1 table)  3 times day if BP > 160/100 270 tablet 3   loratadine  (CLARITIN ) 10 MG tablet Take 1 tablet (10 mg total) by mouth daily. 30 tablet 11   MAGNESIUM GLYCINATE PO Take 240 mg by mouth daily.     nirmatrelvir /ritonavir , renal dosing, (PAXLOVID ) 10 x 150 MG & 10 x 100MG  TABS Take 2 tablets by mouth 2 (two) times daily for 5 days. (Take nirmatrelvir  150 mg one  tablet twice daily for 5 days and ritonavir  100 mg one tablet twice daily for 5 days) Patient GFR is 33 20 tablet 0   olmesartan -hydrochlorothiazide (BENICAR  HCT) 40-25 MG tablet  Take 1 tablet by mouth daily. 90 tablet 3   Omega-3 Fatty Acids (FISH OIL PO) Take 1 capsule by mouth daily.     OVER THE COUNTER MEDICATION Joint liquid 2,00 glucosamine, 200 chondrotin     pantoprazole  (PROTONIX ) 40 MG tablet Take 1 tablet by mouth once daily 90 tablet 0   Probiotic Product (PROBIOTIC PO) Take 1 capsule by mouth daily.     rosuvastatin  (CRESTOR ) 5 MG tablet Take 1 tablet (5 mg total) by mouth daily. 90 tablet 3   spironolactone  (ALDACTONE ) 25 MG tablet Take 25 mg by mouth daily.     TURMERIC CURCUMIN PO Take 1 tablet by mouth daily.     Zinc  50 MG TABS Take 50 mg by mouth daily.     fluticasone  (FLONASE ) 50 MCG/ACT nasal spray Place 1 spray into both nostrils daily. 16 g 3   promethazine -dextromethorphan (PROMETHAZINE -DM) 6.25-15 MG/5ML syrup Take 5 mLs by mouth 4 (four) times daily as needed for cough. 118 mL 0   No facility-administered medications prior to visit.    Family History  Problem Relation Age of Onset   Cancer Mother        breast   Heart disease Father        heart attack at age 75    Social History   Socioeconomic History   Marital status: Married    Spouse name: Elveria   Number of children: 3   Years of education: 12   Highest education level: Not on file  Occupational History    Comment: retired  Tobacco Use   Smoking status: Former    Current packs/day: 0.00    Average packs/day: 2.0 packs/day for 20.0 years (40.0 ttl pk-yrs)    Types: Cigarettes    Start date: 02/24/1948    Quit date: 02/24/1968    Years since quitting: 55.7    Passive exposure: Never   Smokeless tobacco: Never  Vaping Use   Vaping status: Never Used  Substance and Sexual Activity   Alcohol use: No   Drug use: No   Sexual activity: Not Currently  Other Topics Concern   Not on file  Social History Narrative   Lives at home with spouse   Caffeine use- occass coffee, tea   Social Drivers of Corporate investment banker Strain: Low Risk  (06/14/2023)   Overall  Financial Resource Strain (CARDIA)    Difficulty of Paying Living Expenses: Not hard at all  Food Insecurity: Low Risk  (07/18/2023)   Received from [facility]   Hunger Vital Sign    Within the past 12 months, you worried that your food would run out before you got money to buy more: Never true    Within the past 12 months, the food you bought just didn't last and you didn't have money to get more. : Never true  Transportation Needs: No Transportation Needs (07/18/2023)   Received from [facility]    In the past 12 months, has lack of reliable transportation kept you from medical appointments, meetings, work or from getting things needed for daily living? : No  Physical Activity: Sufficiently Active (06/14/2023)   Exercise Vital Sign    Days of Exercise per Week: 3 days    Minutes of Exercise per Session: 60  min  Stress: No Stress Concern Present (06/14/2023)   [employer] of Occupational Health - Occupational Stress Questionnaire    Feeling of Stress : Not at all  Social Connections: Moderately Integrated (06/14/2023)   Social Connection and Isolation Panel    Frequency of Communication with Friends and Family: More than three times a week    Frequency of Social Gatherings with Friends and Family: Once a week    Attends Religious Services: More than 4 times per year    Active Member of [employer] or Organizations: No    Attends Banker Meetings: Never    Marital Status: Married  Catering manager Violence: Not At Risk (06/14/2023)   Humiliation, Afraid, Rape, and Kick questionnaire    Fear of Current or Ex-Partner: No    Emotionally Abused: No    Physically Abused: No    Sexually Abused: No                                                                                                  Objective:  Physical Exam: BP (!) 140/50 (BP Location: Right Arm, Patient Position: Sitting, Cuff Size: Large) Comment: recheck done manual  Pulse (!) 57   Temp  (!) 97.2 F (36.2 C) (Temporal)   Resp 18   Wt 186 lb 3.2 oz (84.5 kg)   SpO2 98%   BMI 25.97 kg/m    Physical Exam VITALS: BP- 140/50 GENERAL: Alert, cooperative, well developed, no acute distress. HEENT: Normocephalic, normal oropharynx, moist mucous membranes. CHEST: Clear to auscultation bilaterally, no wheezes, rhonchi, or crackles. CARDIOVASCULAR: Normal heart rate and rhythm, S1 and S2 normal without murmurs. ABDOMEN: Soft, non-tender, non-distended, without organomegaly, normal bowel sounds. EXTREMITIES: No cyanosis or edema. NEUROLOGICAL: Cranial nerves grossly intact, moves all extremities without gross motor or sensory deficit.   Physical Exam  DG Knee Complete 4 Views Left Result Date: 10/21/2023 CLINICAL DATA:  Left knee pain for 6 weeks. EXAM: LEFT KNEE - COMPLETE 4+ VIEW COMPARISON:  None Available. FINDINGS: Medial tibiofemoral joint space narrowing. Mild tricompartmental peripheral spurring. No fracture, erosion, or focal bone abnormality. Small to moderate joint effusion. Chondrocalcinosis. Vascular calcifications. IMPRESSION: 1. Mild tricompartmental osteoarthritis, most prominent in the medial tibiofemoral compartment. 2. Small to moderate joint effusion. 3. Chondrocalcinosis. Electronically Signed   By: Andrea Gasman M.D.   On: 10/21/2023 17:09   DG Lumbar Spine Complete Result Date: 10/21/2023 CLINICAL DATA:  Chronic low back pain.  No known injury. EXAM: LUMBAR SPINE - COMPLETE 4+ VIEW COMPARISON:  Radiograph dated 06/29/2022. FINDINGS: Five lumbar type vertebra. There is no acute fracture or subluxation of the lumbar spine. Multilevel degenerative changes with spurring. Lower lumbar facet arthropathy. There is advanced atherosclerotic calcification of the abdominal aorta. The soft tissues are unremarkable. IMPRESSION: 1. No acute findings. 2. Multilevel degenerative changes. Electronically Signed   By: Vanetta Chou M.D.   On: 10/21/2023 16:23   VAS US  RENAL  ARTERY DUPLEX Result Date: 09/22/2023 ABDOMINAL VISCERAL Patient Name:  David [name]  Date of Exam:   09/21/2023 Medical Rec #: [id number]  Accession #:    [id number] Date of Birth: 07-31-1933      Patient Gender: M Patient Age:   80 years Exam Location:  [address] Procedure:      VAS US  RENAL ARTERY DUPLEX Referring Phys: [id number] BEVERLEY KATHEE HUMMER -------------------------------------------------------------------------------- Indications: primary hypertension High Risk Factors: Hypertension, hyperlipidemia, past history of smoking,                    coronary artery disease. Comparison Study: N/A Performing Technologist: Dena Pane  Examination Guidelines: A complete evaluation includes B-mode imaging, spectral Doppler, color Doppler, and power Doppler as needed of all accessible portions of each vessel. Bilateral testing is considered an integral part of a complete examination. Limited examinations for reoccurring indications may be performed as noted.  Duplex Findings: +--------------------+--------+--------+------+--------+ Mesenteric          PSV cm/sEDV cm/sPlaqueComments +--------------------+--------+--------+------+--------+ Aorta Prox            137                          +--------------------+--------+--------+------+--------+ Aorta Distal          184                          +--------------------+--------+--------+------+--------+ Celiac Artery Origin  330                          +--------------------+--------+--------+------+--------+ SMA Proximal          260                          +--------------------+--------+--------+------+--------+    +------------------+--------+--------+-------+ Right Renal ArteryPSV cm/sEDV cm/sComment +------------------+--------+--------+-------+ Origin              141      41           +------------------+--------+--------+-------+ Proximal            123      23            +------------------+--------+--------+-------+ Mid                 213      22           +------------------+--------+--------+-------+ Distal              168      17           +------------------+--------+--------+-------+ +-----------------+--------+--------+-------+ Left Renal ArteryPSV cm/sEDV cm/sComment +-----------------+--------+--------+-------+ Origin             126      22           +-----------------+--------+--------+-------+ Proximal           187      13           +-----------------+--------+--------+-------+ Mid                 85      18           +-----------------+--------+--------+-------+ Distal              87      14           +-----------------+--------+--------+-------+  Technologist observations: Technically difficult exam due to patient respirations. +------------+--------+--------+----+-----------+--------+--------+----+ Right KidneyPSV cm/sEDV cm/sRI  Left KidneyPSV cm/sEDV cm/sRI   +------------+--------+--------+----+-----------+--------+--------+----+ Upper Pole  26  3       0.88Upper Pole 26      5       0.83 +------------+--------+--------+----+-----------+--------+--------+----+ Mid         31      4       0.        29      4       0.86 +------------+--------+--------+----+-----------+--------+--------+----+ Lower Pole  26      4       0.86Lower Pole 28      4       0.86 +------------+--------+--------+----+-----------+--------+--------+----+ Hilar       49      7       0.86Hilar      46      7       0.85 +------------+--------+--------+----+-----------+--------+--------+----+ +------------------+-----+------------------+-----+ Right Kidney           Left Kidney             +------------------+-----+------------------+-----+ RAR                    RAR                     +------------------+-----+------------------+-----+ RAR (manual)      1.6  RAR (manual)      1.4    +------------------+-----+------------------+-----+ Cortex            15   Cortex            16    +------------------+-----+------------------+-----+ Cortex thickness       Corex thickness         +------------------+-----+------------------+-----+ Kidney length (cm)13.60Kidney length (cm)13.10 +------------------+-----+------------------+-----+  Summary: Renal:  Right: Normal size right kidney. RRV flow present. 1-59% stenosis of        the right renal artery. Abnormal right Resistive Index. Left:  Normal size of left kidney. LRV flow present. No evidence of        left renal artery stenosis. Abnormal left Resisitve Index. Mesenteric: Normal Superior Mesenteric artery findings. 70 to 99% stenosis in the celiac artery.  *See table(s) above for measurements and observations.  Diagnosing physician: Deatrice Cage MD  Electronically signed by Deatrice Cage MD on 09/22/2023 at 1:06:59 PM.    Final    CT Chest Wo Contrast Result Date: 09/21/2023 CLINICAL DATA:  Nodule follow up EXAM: CT CHEST WITHOUT CONTRAST TECHNIQUE: Multidetector CT imaging of the chest was performed following the standard protocol without IV contrast. RADIATION DOSE REDUCTION: This exam was performed according to the departmental dose-optimization program which includes automated exposure control, adjustment of the mA and/or kV according to patient size and/or use of iterative reconstruction technique. COMPARISON:  None Available. FINDINGS: Cardiovascular: Dense atheromatous calcifications of aorta and coronary arteries. Mild cardiomegaly. No pericardial effusion. Mediastinum/Nodes: No enlarged mediastinal or axillary lymph nodes. Thyroid  gland, trachea, and esophagus demonstrate no significant findings. Lungs/Pleura: Tree-in-bud appearance of the interstitium in the right middle lobe present on the prior study has resolved. There is minimal dependent subsegmental atelectasis or scarring in lungs otherwise clear. No pneumothorax  or pleural effusion. Upper Abdomen: No acute abnormality. Musculoskeletal: Mild bilateral gynecomastia. IMPRESSION: 1. Interval resolution of the tree-in-bud appearance in the right middle lobe. 2. Aortic and coronary artery atherosclerosis (ICD10-I70.0). 3. Gynecomastia. Electronically Signed   By: Fonda Field M.D.   On: 09/21/2023 17:12    Recent Results (from the past 2160 hours)  Vitamin D  1,25 dihydroxy  Status: None   Collection Time: 08/31/23 11:16 AM  Result Value Ref Range   Vitamin D  1, 25 (OH)2 Total 34 18 - 72 pg/mL   Vitamin D3 1, 25 (OH)2 34 pg/mL   Vitamin D2 1, 25 (OH)2 <8 pg/mL    Comment: (Note) Vitamin D3, 1,25(OH)2 indicates both endogenous  production and supplementation. Vitamin D2, 1,25(OH)2 is  an indicator of exogenous sources, such as diet or  supplementation. Interpretation and therapy are based on  measurement of Vitamin D , 1,25 (OH)2, Total. . This test was developed, and its analytical performance  characteristics have been determined by [employer]. It has not been cleared or approved by the  FDA. This assay has been validated pursuant to the CLIA  regulations and is used for clinical purposes. . For additional information, please refer to http://education.[facility].com/faq/FAQ199 (This link is being provided for  informational/educational purposes only.) . [facility] [address] [place] [address] [phone] Johanna Agent L. Gino, MD, PhD   PSA     Status: Abnormal   Collection Time: 08/31/23 11:16 AM  Result Value Ref Range   PSA 0.07 (L) 0.10 - 4.00 ng/mL    Comment: Test performed using [employer] PSA Assay, a parmagnetic partical, chemiluminecent immunoassay.  TSH     Status: None   Collection Time: 08/31/23 11:16 AM  Result Value Ref Range   TSH 2.11 0.35 - 5.50 uIU/mL  POC COVID-19 Bin[employer]Now     Status: Abnormal   Collection Time: 10/29/23  1:39 PM  Result Value Ref Range    SARS Coronavirus 2 Ag Positive (A) Negative        Beverley Adine Hummer, MD, MS

## 2023-11-03 NOTE — Patient Instructions (Signed)
  VISIT SUMMARY: Today, we reviewed your ongoing health conditions, including your hypertension, coronary artery disease, hyperlipidemia, chronic kidney disease, and recent COVID-19 infection with persistent cough. We made adjustments to your medications and provided guidance on managing your symptoms.  YOUR PLAN: COVID-19 INFECTION WITH PERSISTENT COUGH: You have a persistent cough following your COVID-19 infection, along with shortness of breath and nasal drainage. -Continue taking benzonatate  200 mg three times a day for your cough. -Refill your promethazine  DM syrup for cough relief. -Refill your Flonase  for nasal drainage. -Stay hydrated by drinking plenty of water . -Return to the clinic if your symptoms do not improve in a week. -You have completed your course of Paxlovid .  RESISTANT HYPERTENSION: Your blood pressure remains at 140/50 mmHg with a wide pulse pressure. You are on multiple medications to manage this. -Continue your current antihypertensive medications: amlodipine , hydralazine , olmesartan -hydrochlorothiazide, and spironolactone .  CORONARY ARTERY DISEASE AND AORTIC ATHEROSCLEROSIS: You have mild coronary artery disease and aortic atherosclerosis. -Continue taking aspirin  81 mg daily for secondary prevention of cardiovascular disease.  HYPERLIPIDEMIA: You are managing your high cholesterol with medication. -Continue taking rosuvastatin  5 mg daily. -We will order a fasting lipid panel to check your cholesterol levels. -We will order a complete metabolic panel to check your liver function.  CHRONIC KIDNEY DISEASE, STAGE 3A: You have stage 3a chronic kidney disease. -We will order a complete metabolic panel to check your creatinine and blood sugar levels.

## 2023-11-04 ENCOUNTER — Telehealth: Payer: Self-pay

## 2023-11-04 NOTE — Telephone Encounter (Signed)
 Copied from CRM #[id number]. Topic: Clinical - Medication Question >> Nov 03, 2023  3:21 PM Sasha M wrote: Reason for CRM: pt wife wants to know if pt still needs to take the pantoprazole  medication. She said that he just had an appt today and dr sebastian says to continue to take current meds but pantoprazole  is not on the list and they need clarification of whether or not he needs it. [phone] is the wifes number and she would a phone call to update her on this info, thank you

## 2023-11-04 NOTE — Telephone Encounter (Signed)
 Spoke to patient and wife and advised that he can continue taking medication per Dr. Sebastian MD verbal approval. Patient and wife verbalized understandings and a thank you for callback.

## 2023-11-05 ENCOUNTER — Other Ambulatory Visit (INDEPENDENT_AMBULATORY_CARE_PROVIDER_SITE_OTHER)

## 2023-11-05 ENCOUNTER — Ambulatory Visit: Payer: Self-pay | Admitting: Family Medicine

## 2023-11-05 DIAGNOSIS — E875 Hyperkalemia: Secondary | ICD-10-CM

## 2023-11-05 LAB — BASIC METABOLIC PANEL WITH GFR
BUN: 29 mg/dL — ABNORMAL HIGH (ref 6–23)
CO2: 24 meq/L (ref 19–32)
Calcium: 10 mg/dL (ref 8.4–10.5)
Chloride: 103 meq/L (ref 96–112)
Creatinine, Ser: 2.09 mg/dL — ABNORMAL HIGH (ref 0.40–1.50)
GFR: 27.74 mL/min — ABNORMAL LOW (ref >60.00)
Glucose, Bld: 98 mg/dL (ref 70–99)
Potassium: 5.2 meq/L — ABNORMAL HIGH (ref 3.5–5.1)
Sodium: 135 meq/L (ref 135–145)

## 2023-11-06 ENCOUNTER — Emergency Department (HOSPITAL_COMMUNITY)

## 2023-11-06 ENCOUNTER — Emergency Department (HOSPITAL_COMMUNITY)
Admission: EM | Admit: 2023-11-06 | Discharge: 2023-11-06 | Disposition: A | Discharge status: Home / Self Care | Attending: Emergency Medicine | Admitting: Emergency Medicine

## 2023-11-06 ENCOUNTER — Encounter (HOSPITAL_COMMUNITY): Payer: Self-pay

## 2023-11-06 DIAGNOSIS — R7989 Other specified abnormal findings of blood chemistry: Secondary | ICD-10-CM | POA: Diagnosis present

## 2023-11-06 DIAGNOSIS — Z7982 Long term (current) use of aspirin: Secondary | ICD-10-CM | POA: Insufficient documentation

## 2023-11-06 DIAGNOSIS — N183 Chronic kidney disease, stage 3 unspecified: Secondary | ICD-10-CM

## 2023-11-06 LAB — COMPREHENSIVE METABOLIC PANEL WITH GFR
ALT: 16 U/L (ref 0–44)
AST: 24 U/L (ref 15–41)
Albumin: 4.4 g/dL (ref 3.5–5.0)
Alkaline Phosphatase: 54 U/L (ref 38–126)
Anion gap: 16 — ABNORMAL HIGH (ref 5–15)
BUN: 27 mg/dL — ABNORMAL HIGH (ref 8–23)
CO2: 18 mmol/L — ABNORMAL LOW (ref 22–32)
Calcium: 10.3 mg/dL (ref 8.9–10.3)
Chloride: 101 mmol/L (ref 98–111)
Creatinine, Ser: 1.76 mg/dL — ABNORMAL HIGH (ref 0.61–1.24)
GFR, Estimated: 37 mL/min — ABNORMAL LOW (ref >60)
Glucose, Bld: 89 mg/dL (ref 70–99)
Potassium: 5.1 mmol/L (ref 3.5–5.1)
Sodium: 135 mmol/L (ref 135–145)
Total Bilirubin: 0.7 mg/dL (ref 0.0–1.2)
Total Protein: 7.2 g/dL (ref 6.5–8.1)

## 2023-11-06 LAB — CBC WITH DIFFERENTIAL/PLATELET
Abs Immature Granulocytes: 0.03 K/uL (ref 0.00–0.07)
Basophils Absolute: 0 K/uL (ref 0.0–0.1)
Basophils Relative: 0 %
Eosinophils Absolute: 0.2 K/uL (ref 0.0–0.5)
Eosinophils Relative: 3 %
HCT: 41.5 % (ref 39.0–52.0)
Hemoglobin: 13.7 g/dL (ref 13.0–17.0)
Immature Granulocytes: 0 %
Lymphocytes Relative: 17 %
Lymphs Abs: 1.2 K/uL (ref 0.7–4.0)
MCH: 28.8 pg (ref 26.0–34.0)
MCHC: 33 g/dL (ref 30.0–36.0)
MCV: 87.2 fL (ref 80.0–100.0)
Monocytes Absolute: 0.7 K/uL (ref 0.1–1.0)
Monocytes Relative: 10 %
Neutro Abs: 4.8 K/uL (ref 1.7–7.7)
Neutrophils Relative %: 70 %
Platelets: 256 K/uL (ref 150–400)
RBC: 4.76 MIL/uL (ref 4.22–5.81)
RDW: 13.6 % (ref 11.5–15.5)
WBC: 6.9 K/uL (ref 4.0–10.5)
nRBC: 0 % (ref 0.0–0.2)

## 2023-11-06 LAB — URINALYSIS, ROUTINE W REFLEX MICROSCOPIC
Bilirubin Urine: NEGATIVE
Glucose, UA: NEGATIVE mg/dL
Hgb urine dipstick: NEGATIVE
Ketones, ur: 5 mg/dL — AB
Leukocytes,Ua: NEGATIVE
Nitrite: NEGATIVE
Protein, ur: NEGATIVE mg/dL
Specific Gravity, Urine: 1.01 (ref 1.005–1.030)
pH: 8 (ref 5.0–8.0)

## 2023-11-06 LAB — CK: Total CK: 100 U/L (ref 49–397)

## 2023-11-06 MED ORDER — LACTATED RINGERS IV BOLUS
500.0000 mL | Freq: Once | INTRAVENOUS | Status: AC
  Administered 2023-11-06: 500 mL via INTRAVENOUS

## 2023-11-06 MED ORDER — SODIUM CHLORIDE 0.9 % IV SOLN: INTRAVENOUS | Status: DC

## 2023-11-06 NOTE — ED Provider Notes (Signed)
 [place] EMERGENCY DEPARTMENT AT [facility] Provider Note   CSN: [id number] Arrival date & time: 11/06/23  9056     Patient presents with: abnormal labs (Pt presents after being told by his PCP that he needed to report to the ED for abnormal labs and decreased kidney function. Labs available in results review. Pt reports feeling sluggish for about the last two weeks. He reports decreased urine output and frequency. )   David [name] is a 89 y.o. male.   Patient presents due to abnormal laboratory studies.  Review of patient's old chart shows that he had laboratories done yesterday which showed worsening kidney function.  Creatinine is up to above 2.  K was 5.2.  Patient states he has had several weeks of not feeling well.  Denies any volume loss.  Denies any pain anywhere.  No change to his urination.  No recent changes to his medications.  He does take spironolactone        Prior to Admission medications   Medication Sig Start Date End Date Taking? Authorizing Provider  acetaminophen  (QC ACETAMINOPHEN  8 HOURS) 650 MG CR tablet Take 1 tablet (650 mg total) by mouth every 8 (eight) hours as needed for pain. 09/29/23   Sebastian Beverley NOVAK, MD  albuterol  (VENTOLIN  HFA) 108 [phone] Base) MCG/ACT inhaler Inhale 2 puffs into the lungs. 07/18/23 01/14/24  [provider]  Amino Acids (AMINO ACID PO) Take 5 tablets by mouth daily.    [provider]  amLODipine  (NORVASC ) 10 MG tablet TAKE 1 TABLET BY MOUTH AT BEDTIME 09/30/23   Sebastian Beverley NOVAK, MD  Ascorbic Acid (VITAMIN C) 1000 MG tablet Take 1,000 mg by mouth daily.    [provider]  aspirin  EC 81 MG tablet Take 81 mg by mouth in the morning. Swallow whole.    [provider]  B Complex Vitamins (VITAMIN B-COMPLEX) TABS Take 1 tablet by mouth every morning. 06/26/21   [provider]  benzonatate  (TESSALON ) 200 MG capsule Take 1 capsule (200 mg total) by mouth 3 (three) times daily as needed  for cough. 11/03/23   Sebastian Beverley NOVAK, MD  Cholecalciferol (VITAMIN D3) 50 MCG (2000 UT) TABS Take 2,000 Units by mouth daily.    [provider]  Coenzyme Q10 (CO Q10 PO) Take 30 mLs by mouth daily.    [provider]  Cyanocobalamin (VITAMIN B-12) 2500 MCG SUBL Place 2,500 mcg under the tongue daily.    [provider]  diclofenac  Sodium (VOLTAREN ) 1 % GEL Apply 4 g topically 4 (four) times daily as needed. 09/29/23   Sebastian Beverley NOVAK, MD  finasteride  (PROSCAR ) 5 MG tablet Take 1 tablet (5 mg total) by mouth daily. 11/01/23   Sebastian Beverley NOVAK, MD  fluticasone  (FLONASE ) 50 MCG/ACT nasal spray Place 1 spray into both nostrils daily. 11/03/23   Sebastian Beverley NOVAK, MD  GARLIC PO Take by mouth. 12/16/20   [provider]  hydrALAZINE  (APRESOLINE ) 10 MG tablet Take 10 mg (1 table)  3 times day if BP > 160/100 09/17/23   Sebastian Beverley NOVAK, MD  loratadine  (CLARITIN ) 10 MG tablet Take 1 tablet (10 mg total) by mouth daily. 07/27/23   Desai, Nikita S, MD  MAGNESIUM GLYCINATE PO Take 240 mg by mouth daily.    [provider]  olmesartan -hydrochlorothiazide (BENICAR  HCT) 40-25 MG tablet Take 1 tablet by mouth daily. 09/07/23   Walker, Caitlin S, NP  Omega-3 Fatty Acids (FISH OIL PO) Take 1  capsule by mouth daily.    [provider]  OVER THE COUNTER MEDICATION Joint liquid 2,00 glucosamine, 200 chondrotin    [provider]  pantoprazole  (PROTONIX ) 40 MG tablet Take 1 tablet by mouth once daily 09/10/23   Sebastian Beverley NOVAK, MD  Probiotic Product (PROBIOTIC PO) Take 1 capsule by mouth daily.    [provider]  promethazine -dextromethorphan (PROMETHAZINE -DM) 6.25-15 MG/5ML syrup Take 5 mLs by mouth 4 (four) times daily as needed for cough. 11/03/23   Sebastian Beverley NOVAK, MD  rosuvastatin  (CRESTOR ) 5 MG tablet Take 1 tablet (5 mg total) by mouth daily. 08/31/23   Sebastian Beverley NOVAK, MD  spironolactone  (ALDACTONE ) 25 MG tablet Take 25 mg by mouth daily.  09/23/23 09/22/24  [provider]  TURMERIC CURCUMIN PO Take 1 tablet by mouth daily.    [provider]  Zinc  50 MG TABS Take 50 mg by mouth daily.    [provider]    Allergies: Patient has no known allergies.    Review of Systems  All other systems reviewed and are negative.   Updated Vital Signs BP (!) 139/55 (BP Location: Left Arm)   Pulse (!) 52   Temp 97.7 F (36.5 C) (Oral)   Resp 18   SpO2 95%   Physical Exam Vitals and nursing note reviewed.  Constitutional:      General: He is not in acute distress.    Appearance: Normal appearance. He is well-developed. He is not toxic-appearing.  HENT:     Head: Normocephalic and atraumatic.  Eyes:     General: Lids are normal.     Conjunctiva/sclera: Conjunctivae normal.     Pupils: Pupils are equal, round, and reactive to light.  Neck:     Thyroid : No thyroid  mass.     Trachea: No tracheal deviation.  Cardiovascular:     Rate and Rhythm: Normal rate and regular rhythm.     Heart sounds: Normal heart sounds. No murmur heard.    No gallop.  Pulmonary:     Effort: Pulmonary effort is normal. No respiratory distress.     Breath sounds: Normal breath sounds. No stridor. No decreased breath sounds, wheezing, rhonchi or rales.  Abdominal:     General: There is no distension.     Palpations: Abdomen is soft.     Tenderness: There is no abdominal tenderness. There is no rebound.  Musculoskeletal:        General: No tenderness. Normal range of motion.     Cervical back: Normal range of motion and neck supple.  Skin:    General: Skin is warm and dry.     Findings: No abrasion or rash.  Neurological:     Mental Status: He is alert and oriented to person, place, and time. Mental status is at baseline.     GCS: GCS eye subscore is 4. GCS verbal subscore is 5. GCS motor subscore is 6.     Cranial Nerves: No cranial nerve deficit.     Sensory: No sensory deficit.     Motor: Motor function is intact.   Psychiatric:        Attention and Perception: Attention normal.        Speech: Speech normal.        Behavior: Behavior normal.     (all labs ordered are listed, but only abnormal results are displayed) Labs Reviewed  COMPREHENSIVE METABOLIC PANEL WITH GFR  CBC WITH DIFFERENTIAL/PLATELET  URINALYSIS, ROUTINE W REFLEX MICROSCOPIC  CK  EKG: EKG Interpretation Date/Time:  Saturday November 06 2023 10:54:18 EDT Ventricular Rate:  55 PR Interval:    QRS Duration:  145 QT Interval:  488 QTC Calculation: 467 R Axis:   -73  Text Interpretation: Junctional rhythm RBBB and LAFB No significant change since last tracing Confirmed by Dasie Faden ([id number]) on 11/06/2023 12:33:00 PM  Radiology: [facility] Chest 2 View Result Date: 11/06/2023 CLINICAL DATA:  Cough. EXAM: CHEST - 2 VIEW COMPARISON:  Chest radiograph dated 07/18/2023. FINDINGS: No focal consolidation, pleural effusion, pneumothorax. The cardiac silhouette is within normal limits. Atherosclerotic calcification of the aorta. No acute osseous pathology. IMPRESSION: No active cardiopulmonary disease. Electronically Signed   By: Vanetta Chou M.D.   On: 11/06/2023 12:11     Procedures   Medications Ordered in the ED - No data to display                                  Medical Decision Making  Patient is EKG shows junctional rhythm which is unchanged from prior studies.  Chest x-ray without acute findings.  Urinalysis negative except for slight increased ketones.  CBC is normal.  Patient's old record reviewed and shows that his creatinine over the last 2 days has increased quite a bit from his baseline.  From what I can tell, his last creatinine prior to recently was back in June and it was 1.4.  According to recent value, it was above 2.  Patient is on spironolactone  as well as hydrochlorothiazide /arb.suspect this is the cause of the kidney injury.  Awaiting confirmation of this.  Will sign patient out to Dr. Mannie and patient  will likely require admission     Final diagnoses:  None    ED Discharge Orders     None          Dasie Faden, MD 11/06/23 1523

## 2023-11-06 NOTE — ED Provider Notes (Signed)
  Physical Exam  BP (!) 138/48 (BP Location: Right Arm)   Pulse (!) 52   Temp 98.6 F (37 C) (Oral)   Resp 18   SpO2 98%   Physical Exam  Procedures  Procedures  ED Course / MDM    Medical Decision Making Risk Prescription drug management.   LILLETTE Fairy Gravely, assumed care for this patient.  In brief 89 year old male here today at the request of his PCP due to elevated creatinine.  Reportedly, patient's labs showed creatinine greater than 2, K of 5.2.  Discussed patient with hospitalist, Dr. Gala.  We discussed risks and benefits of admission for this patient versus discharge home.  I discussed this with the patient, his son, and wife.  They strongly prefer to go home.  Will provide the patient with some additional IV fluids here in the ED.  He is going to call his PCP on Monday to arrange for some repeat blood work.  Return precautions were discussed at bedside       Gravely Fairy DASEN, DO 11/06/23 1642

## 2023-11-06 NOTE — Discharge Instructions (Addendum)
 Your kidney function improved compared to yesterday, and you received some IV fluids here in the ED.  When you go home, I would like you to try to drink 64 ounces of water  each day.  This will protect your kidneys going forward.  Please call your primary care doctor's office on Monday.  You can tell them that when you were seen in the emergency room, your kidney function had improved from the labs that they had drawn.  They may recommend having repeat blood work done next week to make sure things are still going in the right direction.  Return to the emergency room if you feel unwell, pain in your chest, pain in your abdomen, or have any difficult time producing urine, or develop fever.

## 2023-11-06 NOTE — ED Provider Triage Note (Signed)
 Emergency Medicine Provider Triage Evaluation Note  JALAN FARISS , a 89 y.o. male  was evaluated in triage.  Pt complains of elevated kidney function.  Was sent in by his PCP.  He was recently diagnosed with COVID last week.  Reports that he has been drinking water  but was seen by his PCP and told to come in due to his increasing kidney function and mildly elevated potassium.  Reports he is having some cough but overall is feeling better.  Reports that he has been drinking water  but feels like he has been urinating less.  Denies any flank pain, abdominal pain, or recorded fevers at home.  Denies any dysuria or hematuria.  Review of Systems  Positive:  Negative:   Physical Exam  BP (!) 139/55 (BP Location: Left Arm)   Pulse (!) 52   Temp 97.7 F (36.5 C) (Oral)   Resp 18   SpO2 95%  Gen:   Awake, no distress   Resp:  Normal effort  MSK:   Moves extremities without difficulty  Other:    Medical Decision Making  Medically screening exam initiated at 11:13 AM.  Appropriate orders placed.  [name] was informed that the remainder of the evaluation will be completed by another provider, this initial triage assessment does not replace that evaluation, and the importance of remaining in the ED until their evaluation is complete.  Labs and CXR ordered based on patient h/o PNA   Bernis Ernst, PA-C 11/06/23 1114

## 2023-11-06 NOTE — ED Triage Notes (Signed)
 Chief Complaint  Patient presents with   abnormal labs    Pt presents after being told by his PCP that he needed to report to the ED for abnormal labs and decreased kidney function. Labs available in results review. Pt reports feeling sluggish for about the last two weeks. He reports decreased urine output and frequency.

## 2023-11-09 ENCOUNTER — Encounter: Payer: Self-pay | Admitting: Family Medicine

## 2023-11-09 NOTE — Progress Notes (Signed)
 Noted

## 2023-11-17 ENCOUNTER — Ambulatory Visit (INDEPENDENT_AMBULATORY_CARE_PROVIDER_SITE_OTHER)

## 2023-11-17 ENCOUNTER — Ambulatory Visit: Payer: Self-pay

## 2023-11-17 ENCOUNTER — Ambulatory Visit (INDEPENDENT_AMBULATORY_CARE_PROVIDER_SITE_OTHER): Admitting: Nurse Practitioner

## 2023-11-17 ENCOUNTER — Ambulatory Visit: Payer: Self-pay | Admitting: Nurse Practitioner

## 2023-11-17 ENCOUNTER — Encounter: Payer: Self-pay | Admitting: Nurse Practitioner

## 2023-11-17 VITALS — BP 120/54 | HR 55 | Temp 97.0°F | Ht 71.0 in | Wt 188.0 lb

## 2023-11-17 DIAGNOSIS — N1832 Chronic kidney disease, stage 3b: Secondary | ICD-10-CM | POA: Diagnosis not present

## 2023-11-17 DIAGNOSIS — W19XXXA Unspecified fall, initial encounter: Secondary | ICD-10-CM

## 2023-11-17 DIAGNOSIS — M25512 Pain in left shoulder: Secondary | ICD-10-CM | POA: Diagnosis not present

## 2023-11-17 LAB — BASIC METABOLIC PANEL WITH GFR
BUN: 28 mg/dL — ABNORMAL HIGH (ref 6–23)
CO2: 23 meq/L (ref 19–32)
Calcium: 9.5 mg/dL (ref 8.4–10.5)
Chloride: 95 meq/L — ABNORMAL LOW (ref 96–112)
Creatinine, Ser: 1.79 mg/dL — ABNORMAL HIGH (ref 0.40–1.50)
GFR: 33.4 mL/min — ABNORMAL LOW (ref >60.00)
Glucose, Bld: 83 mg/dL (ref 70–99)
Potassium: 4.8 meq/L (ref 3.5–5.1)
Sodium: 127 meq/L — ABNORMAL LOW (ref 135–145)

## 2023-11-17 MED ORDER — CEPHALEXIN 500 MG PO CAPS: 500.0000 mg | ORAL_CAPSULE | Freq: Three times a day (TID) | ORAL | 0 refills | Status: DC

## 2023-11-17 NOTE — Telephone Encounter (Signed)
 FYI Only or Action Required?: FYI only for provider.  Patient was last seen in primary care on 11/03/2023 by Sebastian Beverley NOVAK, MD.  Called Nurse Triage reporting Fall.  Symptoms began yesterday.  Interventions attempted: Nothing.  Symptoms are: unchanged.  Triage Disposition: See PCP When Office is Open (Within 3 Days)  Patient/caregiver understands and will follow disposition?: Yes    Copied from CRM #[id number]. Topic: Clinical - Red Word Triage >> Nov 17, 2023  8:57 AM Roselie BROCKS wrote: [facility] that prompted transfer to Nurse Triage: Patients wife ,carolyn Korol is stating the patient fell and hurt his shoulder, and is very painful when he try's to move it Reason for Disposition  MILD weakness (e.g., does not interfere with ability to work, go to school, normal activities)  (Exception: Mild weakness is a chronic symptom.)  Answer Assessment - Initial Assessment Questions 1. MECHANISM: How did the fall happen?     Yesterday, states he was outdoors working  2. DOMESTIC VIOLENCE AND ELDER ABUSE SCREENING: Did you fall because someone pushed you or tried to hurt you? If Yes, ask: Are you safe now?     denies 3. ONSET: When did the fall happen? (e.g., minutes, hours, or days ago)    yesterday 4. LOCATION: What part of the body hit the ground? (e.g., back, buttocks, head, hips, knees, hands, head, stomach)     Right shoulder 5. INJURY: Did you hurt (injure) yourself when you fell? If Yes, ask: What did you injure? Tell me more about this? (e.g., body area; type of injury; pain severity)     Can move it, painful when he moves it.  6. PAIN: Is there any pain? If Yes, ask: How bad is the pain? (e.g., Scale 0-10; or none, mild,      unknown 7. SIZE: For cuts, bruises, or swelling, ask: How large is it? (e.g., inches or centimeters)      no 8. PREGNANCY: Is there any chance you are pregnant? When was your last menstrual period?     na 9. OTHER SYMPTOMS: Do you  have any other symptoms? (e.g., dizziness, fever, weakness; new-onset or worsening).      denies 10. CAUSE: What do you think caused the fall (or falling)? (e.g., dizzy spell, tripped)       tripped  Protocols used: Falls and [facility]

## 2023-11-17 NOTE — Telephone Encounter (Signed)
 Noted. Appointment scheduled by Nurse Triage for today 11/17/23 with Tinnie.

## 2023-11-17 NOTE — Assessment & Plan Note (Signed)
 Chronic kidney disease with recent elevation in blood levels. Recheck BMP today.

## 2023-11-17 NOTE — Patient Instructions (Signed)
 It was great to see you!  Start using ice for 10 minutes 4-5 times per day  Take tylenol  1,000mg  3 times a day as needed for pain   We will get an xray today  We are checking your labs today and will let you know the results via mychart/phone.   Let's follow-up with any concerns   Take care,  Tinnie Harada, NP

## 2023-11-17 NOTE — Progress Notes (Signed)
 Acute Office Visit  Subjective:     Patient ID: David [name], male    DOB: 21-May-1933, 89 y.o.   MRN: [id number]  Chief Complaint  Patient presents with   Shoulder Pain    Left shoulder pain-fell at home  on 11/16/23    HPI Discussed the use of AI scribe software for clinical note transcription with the patient, who gave verbal consent to proceed.  History of Present Illness   David [name] is an 89 year old male who presents with shoulder pain after a fall. He is accompanied by his wife.  He fell yesterday while washing a screen, hitting a flower pot and landing on cement. There was no head injury, dizziness, or lightheadedness before the fall. He experiences soreness in the shoulder without hand weakness. Tylenol  has not been effective for pain relief. Voltaren  gel was prescribed in August but has not been used. Ice has not been applied to the shoulder.  He visited the emergency room last Saturday for elevated blood potassium and kidney levels, with no medication changes. He denies trouble with urination, chest pain, or shortness of breath. He had COVID-19 at the beginning of this month.      ROS See pertinent positives and negatives per HPI.     Objective:    BP (!) 120/54 (BP Location: Right Arm, Patient Position: Sitting, Cuff Size: Normal)   Pulse (!) 55   Temp (!) 97 F (36.1 C)   Ht 5' 11 (1.803 m)   Wt 188 lb (85.3 kg)   SpO2 95%   BMI 26.22 kg/m  BP Readings from Last 3 Encounters:  11/17/23 (!) 120/54  11/06/23 (!) 138/48  11/03/23 (!) 140/50   Wt Readings from Last 3 Encounters:  11/17/23 188 lb (85.3 kg)  11/03/23 186 lb 3.2 oz (84.5 kg)  10/29/23 191 lb 12.8 oz (87 kg)      Physical Exam Vitals and nursing note reviewed.  Constitutional:      Appearance: Normal appearance.  HENT:     Head: Normocephalic.  Eyes:     Conjunctiva/sclera: Conjunctivae normal.  Cardiovascular:     Rate and Rhythm: Normal rate and regular rhythm.     Pulses:  Normal pulses.     Heart sounds: Normal heart sounds.  Pulmonary:     Effort: Pulmonary effort is normal.     Breath sounds: Normal breath sounds.  Musculoskeletal:        General: Tenderness present. No swelling.     Cervical back: Normal range of motion.     Comments: Limited left shoulder ROM due to pain. Significant tenderness to top of left shoulder  Skin:    General: Skin is warm.     Findings: No bruising.         Comments: Pain to this location with palpation  Neurological:     General: No focal deficit present.     Mental Status: He is alert and oriented to person, place, and time.  Psychiatric:        Mood and Affect: Mood normal.        Behavior: Behavior normal.        Thought Content: Thought content normal.        Judgment: Judgment normal.      Assessment & Plan:   Problem List Items Addressed This Visit       Genitourinary   Stage 3b chronic kidney disease (HCC)   Chronic kidney disease with recent elevation  in blood levels. Recheck BMP today.       Relevant Orders   Basic metabolic panel with GFR     Other   Acute pain of left shoulder - Primary   He has acute pain following a fall, with a possible contusion or fracture. Order an x-ray of the left shoulder to rule out fracture. Advise ice application for ten minutes several times daily. Recommend Tylenol  three times daily as needed for pain. Suggest Voltaren  gel for additional pain relief.      Relevant Orders   DG Shoulder Left   Other Visit Diagnoses       Fall, initial encounter       Tripped while cleaning a screen. Pain in left shoulder, see note above. No dizziness, chest pain or symptoms before fall.       Meds ordered this encounter  Medications   DISCONTD: cephALEXin  (KEFLEX ) 500 MG capsule    Sig: Take 1 capsule (500 mg total) by mouth 3 (three) times daily.    Dispense:  21 capsule    Refill:  0    Return if symptoms worsen or fail to improve.  Tinnie DELENA Harada, NP

## 2023-11-17 NOTE — Assessment & Plan Note (Signed)
 He has acute pain following a fall, with a possible contusion or fracture. Order an x-ray of the left shoulder to rule out fracture. Advise ice application for ten minutes several times daily. Recommend Tylenol  three times daily as needed for pain. Suggest Voltaren  gel for additional pain relief.

## 2023-11-18 ENCOUNTER — Ambulatory Visit: Admitting: Family Medicine

## 2023-11-24 ENCOUNTER — Encounter: Payer: Self-pay | Admitting: Family Medicine

## 2023-11-24 ENCOUNTER — Ambulatory Visit (INDEPENDENT_AMBULATORY_CARE_PROVIDER_SITE_OTHER): Admitting: Family Medicine

## 2023-11-24 VITALS — BP 147/59 | HR 47 | Temp 97.2°F | Resp 18 | Ht 71.0 in | Wt 182.0 lb

## 2023-11-24 DIAGNOSIS — M25512 Pain in left shoulder: Secondary | ICD-10-CM | POA: Diagnosis not present

## 2023-11-24 DIAGNOSIS — M1712 Unilateral primary osteoarthritis, left knee: Secondary | ICD-10-CM | POA: Diagnosis not present

## 2023-11-24 DIAGNOSIS — M47816 Spondylosis without myelopathy or radiculopathy, lumbar region: Secondary | ICD-10-CM

## 2023-11-24 DIAGNOSIS — M150 Primary generalized (osteo)arthritis: Secondary | ICD-10-CM | POA: Insufficient documentation

## 2023-11-24 DIAGNOSIS — M19012 Primary osteoarthritis, left shoulder: Secondary | ICD-10-CM

## 2023-11-24 DIAGNOSIS — N1831 Chronic kidney disease, stage 3a: Secondary | ICD-10-CM

## 2023-11-24 DIAGNOSIS — I1A0 Resistant hypertension: Secondary | ICD-10-CM

## 2023-11-24 DIAGNOSIS — E871 Hypo-osmolality and hyponatremia: Secondary | ICD-10-CM

## 2023-11-24 NOTE — Progress Notes (Signed)
 Assessment & Plan   Assessment/Plan:   Assessment and Plan Assessment & Plan Left shoulder pain and acromioclavicular osteoarthritis Left shoulder pain following a fall on 11/16/2023. X-ray showed no fracture or dislocation but mild acromioclavicular degenerative changes. Pain is improving but still sore, especially with heavy lifting. No weakness or significant range of motion limitation. Voltaren  gel and Tylenol  have been ineffective. - Refer to orthopedics for further evaluation and possible additional imaging or interventions. - Educate on regular application of Voltaren  gel for potential relief.  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 Pain persists, and he is not interested in knee replacement. No recent steroid injections. - Refer to orthopedics for evaluation and potential joint aspiration or steroid injection.  Lumbar spondylosis (degenerative disc disease) Mild lumbar spondylosis noted on x-ray, consistent with age-related degenerative changes. No acute changes or significant bone alterations. Pain is present but not debilitating. - Refer to orthopedics for evaluation and discussion of potential interventions if symptoms worsen.  Chronic kidney disease stage 3b CKD stage 3b with recent ER visit for elevated potassium and kidney levels. Potassium has improved, but sodium was low on recent labs, possibly due to dehydration or recent COVID-19 illness. - Recheck electrolytes to monitor sodium levels and kidney function.  Hypertension, difficult to control Hypertension with mild elevation in blood pressure (147/59). Follows with hypertensive clinic. Blood pressure is close to baseline but remains labile and resistant to control.  Hyponatremia, improving Recent hyponatremia noted, likely related to hydration status or recent illness. Sodium levels were low on recent labs but  expected to normalize with improved hydration and recovery from COVID-19. - Recheck electrolytes to monitor sodium levels.      There are no discontinued medications.  Return in about 3 months (around 02/24/2024) for BP.        Subjective:   Encounter date: 11/24/2023  David [name] is a 89 y.o. male who has Rectus diastasis; Community acquired pneumonia of left lower lobe of lung; Pleural effusion; Stage 3a chronic kidney disease (CKD) (HCC); HTN (hypertension); Normocytic anemia; Recurrent pneumonia; Empyema (HCC); Protein-calorie malnutrition, severe; Dysphagia; Stenosis of right carotid artery; Proteinuria; Chronic fatigue; Spinal stenosis of lumbar region without neurogenic claudication; Bradycardia; Mixed hyperlipidemia; Benign prostatic hyperplasia with lower urinary tract symptoms; Memory loss; BBB (bundle branch block); Vitamin D  deficiency; History of colon cancer; Solar lentigo; Feels cold; History of pneumonia; Pulmonary nodule; Stage 3b chronic kidney disease (HCC); 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   He  has a past medical history of Acute hypoxic respiratory failure (HCC) (02/26/2022), BBB (bundle branch block) (11/12/2022), Benign prostatic hyperplasia with lower urinary tract symptoms (11/12/2022), Bradycardia (11/12/2022), Chronic fatigue (11/12/2022), Community acquired pneumonia of left lower lobe of lung (02/26/2022), Dysphagia (05/08/2022), Empyema (HCC) (02/28/2022), Feels cold (01/14/2023), History of colon cancer (12/17/2022), History of pneumonia (01/14/2023),  HTN (hypertension) (02/26/2022), Hypertension, Memory loss (11/12/2022), Mixed hyperlipidemia (11/12/2022), Normocytic anemia (02/26/2022), Pleural effusion (02/26/2022), Protein-calorie malnutrition, severe (03/02/2022), Proteinuria (11/12/2022), Pulmonary nodule (01/14/2023), Rectus diastasis (01/04/2012), Recurrent pneumonia (02/27/2022), Solar lentigo (01/14/2023), Spinal stenosis of lumbar region without neurogenic claudication (11/12/2022), Stage 3a chronic kidney disease (CKD) (HCC) (02/26/2022), Stenosis  of right carotid artery (11/12/2022), and Vitamin D  deficiency (12/17/2022).[name]   He presents with chief complaint of Pain (Patient presents today for  a follow up from a recent fall. Pt had pain in his left shoulder, states he still has not received recent x-ray results /) .   Discussed the use of AI scribe software for clinical note transcription with the patient, who gave verbal consent to proceed.  History of Present Illness David [name] is an 89 year old male with chronic kidney disease stage 3B who presents for follow-up after a fall resulting in left shoulder pain.  Left shoulder pain after fall - Fall occurred on November 16, 2023, while washing a screen, hitting a flower pot, and landing on concrete - Left shoulder pain with soreness, no weakness - No head injury, dizziness, or lightheadedness prior to the fall - Tylenol  has not provided relief - Voltaren  gel was prescribed in August but has not been used - Ice has been applied to the shoulder - X-ray of the left shoulder showed no fracture or dislocation - Shoulder pain is improving but remains sore, especially with lifting heavy objects  Electrolyte abnormalities and renal function - Visited emergency room last Saturday for elevated potassium and kidney levels - No difficulty with urination - Recent laboratory results show improved potassium but decreased sodium - Chronic kidney disease stage 3B  Hypertension - Blood  pressure mildly elevated at 147/59 - Hypertension described as difficult to control and labile - Follows with a hypertensive clinic  Osteoarthritis and chronic joint pain - History of osteoarthritis - Knee x-ray in August showed tricompartmental osteoarthritis and joint effusion - Lower back x-ray showed mild disc disease - Chronic knee and back pain - No recent steroid injections in the knee - Voltaren  gel and Tylenol  have not been effective for pain - Avoids NSAIDs due to kidney concerns - Reluctant to pursue knee replacement after previous right knee replacement  Recent respiratory infection - COVID-19 infection earlier this month - Respiratory status has improved - No chest pain or shortness of breath     ROS  Past Surgical History:  Procedure Laterality Date   COLON SURGERY     KNEE SURGERY  2009 or 2010   replaced knee cap   PROSTATE SURGERY  2012    Outpatient Medications Prior to Visit  Medication Sig Dispense Refill   acetaminophen  (QC ACETAMINOPHEN  8 HOURS) 650 MG CR tablet Take 1 tablet (650 mg total) by mouth every 8 (eight) hours as needed for pain. 90 tablet 0   albuterol  (VENTOLIN  HFA) 108 (90 Base) MCG/ACT inhaler Inhale 2 puffs into the lungs.     Amino Acids (AMINO ACID PO) Take 5 tablets by mouth daily.     amLODipine  (NORVASC ) 10 MG tablet TAKE 1 TABLET BY MOUTH AT BEDTIME 90 tablet 0   Ascorbic Acid (VITAMIN C) 1000 MG tablet Take 1,000 mg by mouth daily.     aspirin  EC 81 MG tablet Take 81 mg by mouth in the morning. Swallow whole.     B Complex Vitamins (VITAMIN B-COMPLEX) TABS Take 1 tablet by mouth every morning.     benzonatate  (TESSALON ) 200 MG capsule Take 1 capsule (200 mg total) by mouth 3 (three) times daily as needed for cough. 30 capsule 0   Cholecalciferol (VITAMIN D3) 50 MCG (2000 UT) TABS Take 2,000 Units by mouth daily.     Coenzyme Q10 (CO Q10 PO) Take 30 mLs by mouth daily.  Cyanocobalamin (VITAMIN B-12) 2500 MCG SUBL Place 2,500  mcg under the tongue daily.     diclofenac  Sodium (VOLTAREN ) 1 % GEL Apply 4 g topically 4 (four) times daily as needed. 100 g 3   finasteride  (PROSCAR ) 5 MG tablet Take 1 tablet (5 mg total) by mouth daily. 90 tablet 0   fluticasone  (FLONASE ) 50 MCG/ACT nasal spray Place 1 spray into both nostrils daily. 16 g 3   GARLIC PO Take by mouth.     hydrALAZINE  (APRESOLINE ) 10 MG tablet Take 10 mg (1 table)  3 times day if BP > 160/100 270 tablet 3   loratadine  (CLARITIN ) 10 MG tablet Take 1 tablet (10 mg total) by mouth daily. 30 tablet 11   MAGNESIUM GLYCINATE PO Take 240 mg by mouth daily.     olmesartan -hydrochlorothiazide (BENICAR  HCT) 40-25 MG tablet Take 1 tablet by mouth daily. 90 tablet 3   Omega-3 Fatty Acids (FISH OIL PO) Take 1 capsule by mouth daily.     OVER THE COUNTER MEDICATION Joint liquid 2,00 glucosamine, 200 chondrotin     pantoprazole  (PROTONIX ) 40 MG tablet Take 1 tablet by mouth once daily 90 tablet 0   Probiotic Product (PROBIOTIC PO) Take 1 capsule by mouth daily.     promethazine -dextromethorphan (PROMETHAZINE -DM) 6.25-15 MG/5ML syrup Take 5 mLs by mouth 4 (four) times daily as needed for cough. 118 mL 0   rosuvastatin  (CRESTOR ) 5 MG tablet Take 1 tablet (5 mg total) by mouth daily. 90 tablet 3   spironolactone  (ALDACTONE ) 25 MG tablet Take 25 mg by mouth daily.     TURMERIC CURCUMIN PO Take 1 tablet by mouth daily.     Zinc  50 MG TABS Take 50 mg by mouth daily.     No facility-administered medications prior to visit.    Family History  Problem Relation Age of Onset   Cancer Mother        breast   Heart disease Father        heart attack at age 31    Social History   Socioeconomic History   Marital status: Married    Spouse name: Elveria   Number of children: 3   Years of education: 12   Highest education level: Not on file  Occupational History    Comment: retired  Tobacco Use   Smoking status: Former    Current packs/day: 0.00    Average packs/day: 2.0  packs/day for 20.0 years (40.0 ttl pk-yrs)    Types: Cigarettes    Start date: 02/24/1948    Quit date: 02/24/1968    Years since quitting: 55.7    Passive exposure: Never   Smokeless tobacco: Never  Vaping Use   Vaping status: Never Used  Substance and Sexual Activity   Alcohol use: No   Drug use: No   Sexual activity: Not Currently  Other Topics Concern   Not on file  Social History Narrative   Lives at home with spouse   Caffeine use- occass coffee, tea   Social Drivers of Corporate investment banker Strain: Low Risk  (06/14/2023)   Overall Financial Resource Strain (CARDIA)    Difficulty of Paying Living Expenses: Not hard at all  Food Insecurity: Low Risk  (07/18/2023)   Received from [facility]   Hunger Vital Sign    Within the past 12 months, you worried that your food would run out before you got money to buy more: Never true    Within the past 12  months, the food you bought just didn't last and you didn't have money to get more. : Never true  Transportation Needs: No Transportation Needs (07/18/2023)   Received from [facility]   Transportation    In the past 12 months, has lack of reliable transportation kept you from medical appointments, meetings, work or from getting things needed for daily living? : No  Physical Activity: Sufficiently Active (06/14/2023)   Exercise Vital Sign    Days of Exercise per Week: 3 days    Minutes of Exercise per Session: 60 min  Stress: No Stress Concern Present (06/14/2023)   [employer] of Occupational Health - Occupational Stress Questionnaire    Feeling of Stress : Not at all  Social Connections: Moderately Integrated (06/14/2023)   Social Connection and Isolation Panel    Frequency of Communication with Friends and Family: More than three times a week    Frequency of Social Gatherings with Friends and Family: Once a week    Attends Religious Services: More than 4 times per year    Active Member of [employer] or Organizations: No     Attends Banker Meetings: Never    Marital Status: Married  Catering manager Violence: Not At Risk (06/14/2023)   Humiliation, Afraid, Rape, and Kick questionnaire    Fear of Current or Ex-Partner: No    Emotionally Abused: No    Physically Abused: No    Sexually Abused: No                                                                                                  Objective:  Physical Exam: BP (!) 147/59 (BP Location: Left Arm, Patient Position: Sitting, Cuff Size: Normal) Comment: recheck after resting  Pulse (!) 47   Temp (!) 97.2 F (36.2 C) (Temporal)   Resp 18   Ht 5' 11 (1.803 m)   Wt 182 lb (82.6 kg)   SpO2 97%   BMI 25.38 kg/m    Physical Exam VITALS: BP- 147/59 GENERAL: Alert, cooperative, well developed, no acute distress. HEENT: Normocephalic, normal oropharynx, moist mucous membranes. CHEST: Clear to auscultation bilaterally, no wheezes, rhonchi, or crackles. CARDIOVASCULAR: Normal heart rate and rhythm, S1 and S2 normal without murmurs. ABDOMEN: Soft, non-tender, non-distended, without organomegaly, normal bowel sounds. EXTREMITIES: No cyanosis or edema. MUSCULOSKELETAL: Left shoulder range of motion good, tenderness on palpation. NEUROLOGICAL: Cranial nerves grossly intact, moves all extremities without gross motor or sensory deficit.   Physical Exam  DG Shoulder Left Result Date: 11/17/2023 CLINICAL DATA:  Left shoulder pain after fall. Decreased range of motion. EXAM: LEFT SHOULDER - 2+ VIEW COMPARISON:  None Available. FINDINGS: There is no evidence of fracture or dislocation. Mild acromioclavicular degenerative spurring. Subcortical cystic change in the lateral humeral head. Soft tissues are unremarkable. IMPRESSION: 1. No fracture or dislocation of the left shoulder. 2. Mild acromioclavicular degenerative change. Electronically Signed   By: Andrea Gasman M.D.   On: 11/17/2023 17:51   DG Chest 2 View Result Date:  11/06/2023 CLINICAL DATA:  Cough. EXAM: CHEST - 2 VIEW COMPARISON:  Chest radiograph dated  07/18/2023. FINDINGS: No focal consolidation, pleural effusion, pneumothorax. The cardiac silhouette is within normal limits. Atherosclerotic calcification of the aorta. No acute osseous pathology. IMPRESSION: No active cardiopulmonary disease. Electronically Signed   By: Vanetta Chou M.D.   On: 11/06/2023 12:11   DG Knee Complete 4 Views Left Result Date: 10/21/2023 CLINICAL DATA:  Left knee pain for 6 weeks. EXAM: LEFT KNEE - COMPLETE 4+ VIEW COMPARISON:  None Available. FINDINGS: Medial tibiofemoral joint space narrowing. Mild tricompartmental peripheral spurring. No fracture, erosion, or focal bone abnormality. Small to moderate joint effusion. Chondrocalcinosis. Vascular calcifications. IMPRESSION: 1. Mild tricompartmental osteoarthritis, most prominent in the medial tibiofemoral compartment. 2. Small to moderate joint effusion. 3. Chondrocalcinosis. Electronically Signed   By: Andrea Gasman M.D.   On: 10/21/2023 17:09   DG Lumbar Spine Complete Result Date: 10/21/2023 CLINICAL DATA:  Chronic low back pain.  No known injury. EXAM: LUMBAR SPINE - COMPLETE 4+ VIEW COMPARISON:  Radiograph dated 06/29/2022. FINDINGS: Five lumbar type vertebra. There is no acute fracture or subluxation of the lumbar spine. Multilevel degenerative changes with spurring. Lower lumbar facet arthropathy. There is advanced atherosclerotic calcification of the abdominal aorta. The soft tissues are unremarkable. IMPRESSION: 1. No acute findings. 2. Multilevel degenerative changes. Electronically Signed   By: Vanetta Chou M.D.   On: 10/21/2023 16:23   VAS US  RENAL ARTERY DUPLEX Result Date: 09/22/2023 ABDOMINAL VISCERAL Patient Name:  TAYLIN LEDER  Date of Exam:   09/21/2023 Medical Rec #: [id number]      Accession #:    [id number] Date of Birth: March 07, 1933      Patient Gender: M Patient Age:   79 years Exam Location:  [address] Procedure:      VAS US  RENAL ARTERY DUPLEX Referring Phys: [id number] BEVERLEY KATHEE HUMMER -------------------------------------------------------------------------------- Indications: primary hypertension High Risk Factors: Hypertension, hyperlipidemia, past history of smoking,                    coronary artery disease. Comparison Study: N/A Performing Technologist: Dena Pane  Examination Guidelines: A complete evaluation includes B-mode imaging, spectral Doppler, color Doppler, and power Doppler as needed of all accessible portions of each vessel. Bilateral testing is considered an integral part of a complete examination. Limited examinations for reoccurring indications may be performed as noted.  Duplex Findings: +--------------------+--------+--------+------+--------+ Mesenteric          PSV cm/sEDV cm/sPlaqueComments +--------------------+--------+--------+------+--------+ Aorta Prox            137                          +--------------------+--------+--------+------+--------+ Aorta Distal          184                          +--------------------+--------+--------+------+--------+ Celiac Artery Origin  330                          +--------------------+--------+--------+------+--------+ SMA Proximal          260                          +--------------------+--------+--------+------+--------+    +------------------+--------+--------+-------+ Right Renal ArteryPSV cm/sEDV cm/sComment +------------------+--------+--------+-------+ Origin              141      41           +------------------+--------+--------+-------+  Proximal            123      23           +------------------+--------+--------+-------+ Mid                 213      22           +------------------+--------+--------+-------+ Distal              168      17           +------------------+--------+--------+-------+ +-----------------+--------+--------+-------+ Left Renal ArteryPSV  cm/sEDV cm/sComment +-----------------+--------+--------+-------+ Origin             126      22           +-----------------+--------+--------+-------+ Proximal           187      13           +-----------------+--------+--------+-------+ Mid                 85      18           +-----------------+--------+--------+-------+ Distal              87      14           +-----------------+--------+--------+-------+  Technologist observations: Technically difficult exam due to patient respirations. +------------+--------+--------+----+-----------+--------+--------+----+ Right KidneyPSV cm/sEDV cm/sRI  Left KidneyPSV cm/sEDV cm/sRI   +------------+--------+--------+----+-----------+--------+--------+----+ Upper Pole  26      3       0.88Upper Pole 26      5       0.83 +------------+--------+--------+----+-----------+--------+--------+----+ Mid         31      4       0.        29      4       0.86 +------------+--------+--------+----+-----------+--------+--------+----+ Lower Pole  26      4       0.86Lower Pole 28      4       0.86 +------------+--------+--------+----+-----------+--------+--------+----+ Hilar       49      7       0.86Hilar      46      7       0.85 +------------+--------+--------+----+-----------+--------+--------+----+ +------------------+-----+------------------+-----+ Right Kidney           Left Kidney             +------------------+-----+------------------+-----+ RAR                    RAR                     +------------------+-----+------------------+-----+ RAR (manual)      1.6  RAR (manual)      1.4   +------------------+-----+------------------+-----+ Cortex            15   Cortex            16    +------------------+-----+------------------+-----+ Cortex thickness       Corex thickness         +------------------+-----+------------------+-----+ Kidney length (cm)13.60Kidney length (cm)13.10  +------------------+-----+------------------+-----+  Summary: Renal:  Right: Normal size right kidney. RRV flow present. 1-59% stenosis of        the right renal artery. Abnormal right Resistive Index. Left:  Normal size of left kidney. LRV flow present. No evidence of  left renal artery stenosis. Abnormal left Resisitve Index. Mesenteric: Normal Superior Mesenteric artery findings. 70 to 99% stenosis in the celiac artery.  *See table(s) above for measurements and observations.  Diagnosing physician: Deatrice Cage MD  Electronically signed by Deatrice Cage MD on 09/22/2023 at 1:06:59 PM.    Final    CT Chest Wo Contrast Result Date: 09/21/2023 CLINICAL DATA:  Nodule follow up EXAM: CT CHEST WITHOUT CONTRAST TECHNIQUE: Multidetector CT imaging of the chest was performed following the standard protocol without IV contrast. RADIATION DOSE REDUCTION: This exam was performed according to the departmental dose-optimization program which includes automated exposure control, adjustment of the mA and/or kV according to patient size and/or use of iterative reconstruction technique. COMPARISON:  None Available. FINDINGS: Cardiovascular: Dense atheromatous calcifications of aorta and coronary arteries. Mild cardiomegaly. No pericardial effusion. Mediastinum/Nodes: No enlarged mediastinal or axillary lymph nodes. Thyroid  gland, trachea, and esophagus demonstrate no significant findings. Lungs/Pleura: Tree-in-bud appearance of the interstitium in the right middle lobe present on the prior study has resolved. There is minimal dependent subsegmental atelectasis or scarring in lungs otherwise clear. No pneumothorax or pleural effusion. Upper Abdomen: No acute abnormality. Musculoskeletal: Mild bilateral gynecomastia. IMPRESSION: 1. Interval resolution of the tree-in-bud appearance in the right middle lobe. 2. Aortic and coronary artery atherosclerosis (ICD10-I70.0). 3. Gynecomastia. Electronically Signed   By: Fonda Field M.D.   On: 09/21/2023 17:12    Recent Results (from the past 2160 hours)  Vitamin D  1,25 dihydroxy     Status: None   Collection Time: 08/31/23 11:16 AM  Result Value Ref Range   Vitamin D  1, 25 (OH)2 Total 34 18 - 72 pg/mL   Vitamin D3 1, 25 (OH)2 34 pg/mL   Vitamin D2 1, 25 (OH)2 <8 pg/mL    Comment: (Note) Vitamin D3, 1,25(OH)2 indicates both endogenous  production and supplementation. Vitamin D2, 1,25(OH)2 is  an indicator of exogenous sources, such as diet or  supplementation. Interpretation and therapy are based on  measurement of Vitamin D , 1,25 (OH)2, Total. . This test was developed, and its analytical performance  characteristics have been determined by [employer]. It has not been cleared or approved by the  FDA. This assay has been validated pursuant to the CLIA  regulations and is used for clinical purposes. . For additional information, please refer to http://education.[facility].com/faq/FAQ199 (This link is being provided for  informational/educational purposes only.) . [facility] [address] [place] [id number] Johanna Agent L. Gino, MD, PhD   PSA     Status: Abnormal   Collection Time: 08/31/23 11:16 AM  Result Value Ref Range   PSA 0.07 (L) 0.10 - 4.00 ng/mL    Comment: Test performed using Access [employer] PSA Assay, a parmagnetic partical, chemiluminecent immunoassay.  TSH     Status: None   Collection Time: 08/31/23 11:16 AM  Result Value Ref Range   TSH 2.11 0.35 - 5.50 uIU/mL  POC COVID-19 Bin[employer]Now     Status: Abnormal   Collection Time: 10/29/23  1:39 PM  Result Value Ref Range   SARS Coronavirus 2 Ag Positive (A) Negative  Lipid panel     Status: None   Collection Time: 11/03/23 10:33 AM  Result Value Ref Range   Cholesterol 138 0 - 200 mg/dL    Comment: ATP III Classification       Desirable:  < 200 mg/dL  Borderline High:  200 - 239 mg/dL          High:  > =  759 mg/dL   Triglycerides 859.9 0.0 - 149.0 mg/dL    Comment: Normal:  <849 mg/dLBorderline High:  150 - 199 mg/dL   HDL 50.49 >60.99 mg/dL   VLDL 71.9 0.0 - 59.9 mg/dL   LDL Cholesterol 61 0 - 99 mg/dL   Total CHOL/HDL Ratio 3     Comment:                Men          Women1/2 Average Risk     3.4          3.3Average Risk          5.0          4.42X Average Risk          9.6          7.13X Average Risk          15.0          11.0                       NonHDL 88.96     Comment: NOTE:  Non-HDL goal should be 30 mg/dL higher than patient's LDL goal (i.e. LDL goal of < 70 mg/dL, would have non-HDL goal of < 100 mg/dL)  Comprehensive metabolic panel with GFR     Status: Abnormal   Collection Time: 11/03/23 10:33 AM  Result Value Ref Range   Sodium 136 135 - 145 mEq/L   Potassium 5.3 No hemolysis seen (H) 3.5 - 5.1 mEq/L   Chloride 101 96 - 112 mEq/L   CO2 28 19 - 32 mEq/L   Glucose, Bld 96 70 - 99 mg/dL   BUN 27 (H) 6 - 23 mg/dL   Creatinine, Ser 8.15 (H) 0.40 - 1.50 mg/dL   Total Bilirubin 0.8 0.2 - 1.2 mg/dL   Alkaline Phosphatase 42 39 - 117 U/L   AST 19 0 - 37 U/L   ALT 13 0 - 53 U/L   Total Protein 7.5 6.0 - 8.3 g/dL   Albumin 4.5 3.5 - 5.2 g/dL   GFR 67.67 (L) >39.99 mL/min    Comment: Calculated using the CKD-EPI Creatinine Equation (2021)   Calcium  9.9 8.4 - 10.5 mg/dL  Basic Metabolic Panel (BMET)     Status: Abnormal   Collection Time: 11/05/23  9:52 AM  Result Value Ref Range   Sodium 135 135 - 145 mEq/L   Potassium 5.2 No hemolysis seen (H) 3.5 - 5.1 mEq/L   Chloride 103 96 - 112 mEq/L   CO2 24 19 - 32 mEq/L   Glucose, Bld 98 70 - 99 mg/dL   BUN 29 (H) 6 - 23 mg/dL   Creatinine, Ser 7.90 (H) 0.40 - 1.50 mg/dL   GFR 72.25 (L) >39.99 mL/min    Comment: Calculated using the CKD-EPI Creatinine Equation (2021)   Calcium  10.0 8.4 - 10.5 mg/dL  Urinalysis, Routine w reflex microscopic -Urine, Clean Catch     Status: Abnormal   Collection Time: 11/06/23  2:14 PM  Result  Value Ref Range   Color, Urine YELLOW YELLOW   APPearance HAZY (A) CLEAR   Specific Gravity, Urine 1.010 1.005 - 1.030   pH 8.0 5.0 - 8.0   Glucose, UA NEGATIVE NEGATIVE mg/dL   Hgb urine dipstick NEGATIVE NEGATIVE   Bilirubin Urine NEGATIVE NEGATIVE  Ketones, ur 5 (A) NEGATIVE mg/dL   Protein, ur NEGATIVE NEGATIVE mg/dL   Nitrite NEGATIVE NEGATIVE   Leukocytes,Ua NEGATIVE NEGATIVE    Comment: Performed at [facility], [address], [place], [place] [address]  Comprehensive metabolic panel     Status: Abnormal   Collection Time: 11/06/23  2:39 PM  Result Value Ref Range   Sodium 135 135 - 145 mmol/L   Potassium 5.1 3.5 - 5.1 mmol/L   Chloride 101 98 - 111 mmol/L   CO2 18 (L) 22 - 32 mmol/L   Glucose, Bld 89 70 - 99 mg/dL    Comment: Glucose reference range applies only to samples taken after fasting for at least 8 hours.   BUN 27 (H) 8 - 23 mg/dL   Creatinine, Ser 8.23 (H) 0.61 - 1.24 mg/dL   Calcium  10.3 8.9 - 10.3 mg/dL   Total Protein 7.2 6.5 - 8.1 g/dL   Albumin 4.4 3.5 - 5.0 g/dL   AST 24 15 - 41 U/L   ALT 16 0 - 44 U/L   Alkaline Phosphatase 54 38 - 126 U/L   Total Bilirubin 0.7 0.0 - 1.2 mg/dL   GFR, Estimated 37 (L) >60 mL/min    Comment: (NOTE) Calculated using the CKD-EPI Creatinine Equation (2021)    Anion gap 16 (H) 5 - 15    Comment: Performed at [facility], [address] [place], [place] [address]  CBC with Differential     Status: None   Collection Time: 11/06/23  2:39 PM  Result Value Ref Range   WBC 6.9 4.0 - 10.5 K/uL   RBC 4.76 4.22 - 5.81 MIL/uL   Hemoglobin 13.7 13.0 - 17.0 g/dL   HCT 58.4 60.9 - 47.9 %   MCV 87.2 80.0 - 100.0 fL   MCH 28.8 26.0 - 34.0 pg   MCHC 33.0 30.0 - 36.0 g/dL   RDW 86.3 88.4 - 84.4 %   Platelets 256 150 - 400 K/uL   nRBC 0.0 0.0 - 0.2 %   Neutrophils Relative % 70 %   Neutro Abs 4.8 1.7 - 7.7 K/uL   Lymphocytes Relative 17 %   Lymphs Abs 1.2 0.7 - 4.0 K/uL   Monocytes  Relative 10 %   Monocytes Absolute 0.7 0.1 - 1.0 K/uL   Eosinophils Relative 3 %   Eosinophils Absolute 0.2 0.0 - 0.5 K/uL   Basophils Relative 0 %   Basophils Absolute 0.0 0.0 - 0.1 K/uL   Immature Granulocytes 0 %   Abs Immature Granulocytes 0.03 0.00 - 0.07 K/uL    Comment: Performed at [facility], [address], [place], [place] [address]  CK     Status: None   Collection Time: 11/06/23  2:39 PM  Result Value Ref Range   Total CK 100 49 - 397 U/L    Comment: Performed at [facility], [address], [place], [place] [address]  Basic metabolic panel with GFR     Status: Abnormal   Collection Time: 11/17/23  1:57 PM  Result Value Ref Range   Sodium 127 (L) 135 - 145 mEq/L   Potassium 4.8 3.5 - 5.1 mEq/L   Chloride 95 (L) 96 - 112 mEq/L   CO2 23 19 - 32 mEq/L   Glucose, Bld 83 70 - 99 mg/dL   BUN 28 (H) 6 - 23 mg/dL   Creatinine, Ser 8.20 (H) 0.40 - 1.50 mg/dL   GFR 66.59 (  L) >60.00 mL/min    Comment: Calculated using the CKD-EPI Creatinine Equation (2021)   Calcium  9.5 8.4 - 10.5 mg/dL        Beverley Adine Hummer, MD, MS

## 2023-11-24 NOTE — Patient Instructions (Addendum)
  VISIT SUMMARY: You visited us  today for a follow-up after a fall that caused left shoulder pain. We also discussed your chronic kidney disease, hypertension, osteoarthritis, and recent respiratory infection.  YOUR PLAN: LEFT SHOULDER PAIN AND ACROMIOCLAVICULAR OSTEOARTHRITIS: You have left shoulder pain following a fall, with no fractures or dislocations but mild degenerative changes in the shoulder joint. -We will refer you to orthopedics for further evaluation and possible additional imaging or interventions. -Please use Voltaren  gel regularly as it may provide some relief.  LEFT KNEE OSTEOARTHRITIS WITH JOINT EFFUSION: You have chronic osteoarthritis in your left knee with joint effusion, causing persistent pain. -We will refer you to orthopedics for evaluation and potential joint aspiration or steroid injection.  LUMBAR SPONDYLOSIS (DEGENERATIVE DISC DISEASE): You have mild degenerative changes in your lower back, consistent with age-related wear and tear. -We will refer you to orthopedics for evaluation and discussion of potential interventions if your symptoms worsen.  CHRONIC KIDNEY DISEASE STAGE 3B: You have stage 3b chronic kidney disease with recent issues related to elevated potassium and low sodium levels. -We will recheck your electrolytes to monitor your sodium levels and kidney function.  HYPERTENSION, DIFFICULT TO CONTROL: Your blood pressure is mildly elevated and remains difficult to control. -Continue following up with the hypertensive clinic to manage your blood pressure.  HYPONATREMIA, IMPROVING: Your sodium levels were low recently, likely due to dehydration or your recent illness, but are expected to improve. -We will recheck your electrolytes to monitor your sodium levels.                      Contains text generated by [employer]bridge.                                 Contains text generated by Abridge.

## 2023-11-25 LAB — BASIC METABOLIC PANEL WITH GFR
BUN: 33 mg/dL — ABNORMAL HIGH (ref 6–23)
CO2: 24 meq/L (ref 19–32)
Calcium: 9.5 mg/dL (ref 8.4–10.5)
Chloride: 97 meq/L (ref 96–112)
Creatinine, Ser: 1.82 mg/dL — ABNORMAL HIGH (ref 0.40–1.50)
GFR: 32.73 mL/min — ABNORMAL LOW (ref >60.00)
Glucose, Bld: 90 mg/dL (ref 70–99)
Potassium: 4.9 meq/L (ref 3.5–5.1)
Sodium: 129 meq/L — ABNORMAL LOW (ref 135–145)

## 2023-11-29 ENCOUNTER — Ambulatory Visit: Payer: Self-pay | Admitting: Family Medicine

## 2023-11-30 ENCOUNTER — Ambulatory Visit: Admitting: Physician Assistant

## 2023-11-30 ENCOUNTER — Ambulatory Visit: Admitting: Family Medicine

## 2023-11-30 DIAGNOSIS — M1712 Unilateral primary osteoarthritis, left knee: Secondary | ICD-10-CM | POA: Diagnosis not present

## 2023-11-30 DIAGNOSIS — M5442 Lumbago with sciatica, left side: Secondary | ICD-10-CM

## 2023-11-30 DIAGNOSIS — G8929 Other chronic pain: Secondary | ICD-10-CM

## 2023-11-30 DIAGNOSIS — M5441 Lumbago with sciatica, right side: Secondary | ICD-10-CM | POA: Diagnosis not present

## 2023-11-30 DIAGNOSIS — M545 Low back pain, unspecified: Secondary | ICD-10-CM | POA: Insufficient documentation

## 2023-11-30 MED ORDER — LIDOCAINE HCL 1 % IJ SOLN
4.0000 mL | INTRAMUSCULAR | Status: AC | PRN
  Administered 2023-11-30: 4 mL

## 2023-11-30 MED ORDER — METHYLPREDNISOLONE ACETATE 40 MG/ML IJ SUSP
40.0000 mg | INTRAMUSCULAR | Status: AC | PRN
  Administered 2023-11-30: 40 mg via INTRA_ARTICULAR

## 2023-11-30 NOTE — Progress Notes (Signed)
 Office Visit Note   Patient: David [name]           Date of Birth: 10-Mar-1933           MRN: [id number] Visit Date: 11/30/2023              Requested by: Sebastian Beverley NOVAK, MD [address] [place],  [place] [address] PCP: Sebastian Beverley NOVAK, MD   Assessment & Plan: Visit Diagnoses:  1. Unilateral primary osteoarthritis, left knee   2. Chronic bilateral low back pain with sciatica, sciatica laterality unspecified     Plan: Patient is a pleasant 89 year old gentleman who is seen today with his wife.  He has a history of arthritis in both his lumbar spine as well as his left knee.  He has had no recent injury.  He has no significant radicular findings in his low back.  He cannot take anti-inflammatories because of kidney disease.  He does take Tylenol .  I suggested to him trying to go to physical therapy and learn a good exercise plan for his back as well as taking Tylenol  when he has symptoms.  He has had injections in the past but did not find them helpful.  I discussed with him therapy.  He is gena think about this.  With regards to his knee he has osteoarthritis in his left knee.  Has had injections in the past.  Will go forward with a steroid injection today.  He could consider viscosupplementation.  They will let me know if he does not get good relief  Follow-Up Instructions: Return if symptoms worsen or fail to improve.   Orders:  No orders of the defined types were placed in this encounter.  No orders of the defined types were placed in this encounter.     Procedures: Large Joint Inj: L knee on 11/30/2023 10:14 AM Indications: pain and diagnostic evaluation Details: 25 G 1.5 in needle, anteromedial approach  Arthrogram: No  Medications: 40 mg methylPREDNISolone  acetate 40 MG/ML; 4 mL lidocaine  1 % Outcome: tolerated well, no immediate complications Procedure, treatment alternatives, risks and benefits explained, specific risks discussed. Consent was given by the  patient.       Clinical Data: No additional findings.   Subjective: Chief Complaint  Patient presents with   Left Knee - Pain   Lower Back - Pain    HPI pleasant 89 year old gentleman is accompanied by his wife with a chief complaint of low back pain and left knee pain.  Denies any injuries this has been a chronic problem.  He said in the past he has had injections in his back but they did not seem to help.  Takes Tylenol  for the pain in his knee  Review of Systems  All other systems reviewed and are negative.    Objective: Vital Signs: There were no vitals taken for this visit.  Physical Exam Constitutional:      Appearance: Normal appearance.  Pulmonary:     Effort: Pulmonary effort is normal.  Skin:    General: Skin is warm and dry.  Neurological:     General: No focal deficit present.     Mental Status: He is alert and oriented to person, place, and time.  Psychiatric:        Mood and Affect: Mood normal.        Behavior: Behavior normal.     Ortho Exam Examination of his left knee no warmth no effusion no erythema has good flexion extension  of his leg pent tender more over the medial joint line and the lateral joint line.  Neurologically intact. Low back is fairly asymptomatic today no pain with flexion or extension.  Does state verbally that when he does have pain it runs down his legs.  His strength is intact in the distal extremities Specialty Comments:  No specialty comments available.  Imaging: No results found.   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/21/2023   Labile hypertension 07/21/2023   Stage 3b chronic kidney disease (HCC) 07/13/2023   Solar lentigo 01/14/2023   Feels cold 01/14/2023   History of pneumonia 01/14/2023   Pulmonary nodule 01/14/2023   Vitamin D  deficiency 12/17/2022   History of colon cancer 12/17/2022   Stenosis of right carotid artery 11/12/2022   Proteinuria 11/12/2022   Chronic fatigue 11/12/2022   Spinal stenosis of lumbar region without neurogenic claudication 11/12/2022   Bradycardia 11/12/2022   Mixed hyperlipidemia 11/12/2022   Benign prostatic hyperplasia with lower urinary tract symptoms 11/12/2022   Memory loss 11/12/2022   BBB (bundle branch block) 11/12/2022   Dysphagia 05/08/2022   Protein-calorie malnutrition, severe 03/02/2022   Empyema (HCC) 02/28/2022   Recurrent pneumonia 02/27/2022   Community acquired pneumonia of left lower lobe of lung 02/26/2022   Pleural effusion 02/26/2022   Stage 3a chronic kidney disease (CKD) (HCC) 02/26/2022   HTN (hypertension) 02/26/2022   Normocytic anemia 02/26/2022   Rectus diastasis 01/04/2012   Past Medical History:  Diagnosis Date   Acute hypoxic respiratory failure (HCC) 02/26/2022   BBB (bundle branch block) 11/12/2022   Benign prostatic hyperplasia with lower urinary tract symptoms 11/12/2022   Bradycardia 11/12/2022   Chronic fatigue 11/12/2022   Community acquired pneumonia of left lower lobe of lung 02/26/2022   Dysphagia 05/08/2022   Empyema (HCC) 02/28/2022   Feels cold 01/14/2023   History of colon cancer 12/17/2022   History of pneumonia 01/14/2023   HTN (hypertension) 02/26/2022   Hypertension    Memory loss 11/12/2022   Mixed hyperlipidemia 11/12/2022   Normocytic anemia 02/26/2022   Pleural effusion 02/26/2022   Protein-calorie  malnutrition, severe 03/02/2022   Proteinuria 11/12/2022   Pulmonary nodule 01/14/2023   Rectus diastasis 01/04/2012   Recurrent pneumonia 02/27/2022   Solar lentigo 01/14/2023   Spinal stenosis of lumbar region without neurogenic claudication 11/12/2022   Stage 3a chronic kidney disease (CKD) (HCC) 02/26/2022   Stenosis of right carotid artery 11/12/2022   Vitamin D  deficiency 12/17/2022    Family History  Problem Relation Age of Onset   Cancer Mother        breast   Heart disease Father        heart attack at age 30    Past Surgical History:  Procedure Laterality Date   COLON SURGERY     KNEE SURGERY  2009 or 2010   replaced knee cap   PROSTATE SURGERY  2012   Social History   Occupational  History    Comment: retired  Tobacco Use   Smoking status: Former    Current packs/day: 0.00    Average packs/day: 2.0 packs/day for 20.0 years (40.0 ttl pk-yrs)    Types: Cigarettes    Start date: 02/24/1948    Quit date: 02/24/1968    Years since quitting: 55.8    Passive exposure: Never   Smokeless tobacco: Never  Vaping Use   Vaping status: Never Used  Substance and Sexual Activity   Alcohol use: No   Drug use: No   Sexual activity: Not Currently

## 2023-12-01 ENCOUNTER — Telehealth: Payer: Self-pay | Admitting: Physician Assistant

## 2023-12-01 NOTE — Telephone Encounter (Signed)
 Patient's wife called. Says the injection is working.

## 2023-12-06 ENCOUNTER — Telehealth: Payer: Self-pay

## 2023-12-06 NOTE — Telephone Encounter (Signed)
 Copied from CRM [id number]. Topic: Clinical - Medical Advice >> Dec 06, 2023 12:40 PM Ashley R wrote: Reason for CRM: Patient wife calling for David [name] to get an appt for the same type of shot he received in his knee to be given in his back as well. Unable to confirm what the shot was, but it was at Orthopedics and greatly helped him. Asking if Dr. Tobie at [facility] would be recommended by Dr. Sebastian. Callback at [phone]

## 2023-12-07 NOTE — Telephone Encounter (Signed)
 Called patient David [name] and informed him of Dr. Oswald comments/recommendations that he can follow-up with orthopedics if they provide that service. However, we do not provide that service here. Patient verbalized understanding and all (if any) questions were answered.

## 2023-12-08 ENCOUNTER — Other Ambulatory Visit: Payer: Self-pay | Admitting: Family Medicine

## 2023-12-08 DIAGNOSIS — K219 Gastro-esophageal reflux disease without esophagitis: Secondary | ICD-10-CM

## 2023-12-08 NOTE — Telephone Encounter (Signed)
 Noted

## 2023-12-14 ENCOUNTER — Encounter: Admitting: Physical Medicine and Rehabilitation

## 2023-12-24 ENCOUNTER — Other Ambulatory Visit: Payer: Self-pay | Admitting: Family Medicine

## 2023-12-27 ENCOUNTER — Encounter: Payer: Self-pay | Admitting: Radiology

## 2024-01-28 ENCOUNTER — Other Ambulatory Visit: Payer: Self-pay | Admitting: Family Medicine

## 2024-02-25 ENCOUNTER — Ambulatory Visit: Admitting: Family Medicine

## 2024-03-02 ENCOUNTER — Other Ambulatory Visit: Payer: Self-pay | Admitting: Family Medicine

## 2024-03-02 DIAGNOSIS — K219 Gastro-esophageal reflux disease without esophagitis: Secondary | ICD-10-CM

## 2024-03-06 ENCOUNTER — Ambulatory Visit: Admitting: Pulmonary Disease

## 2024-03-06 VITALS — BP 174/69 | HR 54 | Ht 71.5 in | Wt 201.0 lb

## 2024-03-06 DIAGNOSIS — R918 Other nonspecific abnormal finding of lung field: Secondary | ICD-10-CM

## 2024-03-06 DIAGNOSIS — R600 Localized edema: Secondary | ICD-10-CM | POA: Diagnosis not present

## 2024-03-06 NOTE — Patient Instructions (Addendum)
 Your lung nodules are gone based on the last scan.  We will check lower extremity ultrasound to evaluate the swelling in your legs  Follow up as needed

## 2024-03-06 NOTE — Progress Notes (Unsigned)
 "  Established Patient Pulmonology Office Visit   Subjective:  Patient ID: David [name], male    DOB: Jan 17, 1934  MRN: [id number]  CC:  Chief Complaint  Patient presents with   Medical Management of Chronic Issues    Pt states no new concerns     Discussed the use of AI scribe software for clinical note transcription with the patient, who gave verbal consent to proceed.  History of Present Illness David [name] is an 89 year old male who returns for follow up of lung nodule.  His lung nodules had cleared on follow up scan 08/2023. He was last evaluated by Landry Ferrari, NP 10/21/23. He was been doing fine since last visit.  He reports swelling of the left leg for 2 to 3 days, most prominent and uncomfortable around the knee. Swelling was slightly improved this morning but did not resolve, which is unusual for him. He takes a baby aspirin . He has no prior blood clots and denies shortness of breath or cough. His pneumonia from last year has resolved without ongoing respiratory issues.        ROS   Current Medications[1]      Objective:  BP (!) 174/69   Pulse (!) 54   Ht 5' 11.5 (1.816 m) Comment: per pt  Wt 201 lb (91.2 kg)   SpO2 96%   BMI 27.64 kg/m     Physical Exam Constitutional:      General: He is not in acute distress.    Appearance: Normal appearance.  Eyes:     General: No scleral icterus.    Conjunctiva/sclera: Conjunctivae normal.  Cardiovascular:     Rate and Rhythm: Normal rate and regular rhythm.  Pulmonary:     Breath sounds: No wheezing, rhonchi or rales.  Musculoskeletal:     Right lower leg: Edema present.     Left lower leg: Edema present.  Skin:    General: Skin is warm and dry.  Neurological:     General: No focal deficit present.      Diagnostic Review:  Last CBC Lab Results  Component Value Date   WBC 6.9 11/06/2023   HGB 13.7 11/06/2023   HCT 41.5 11/06/2023   MCV 87.2 11/06/2023   MCH 28.8 11/06/2023   RDW 13.6 11/06/2023    PLT 256 11/06/2023   Last metabolic panel Lab Results  Component Value Date   GLUCOSE 90 11/24/2023   NA 129 (L) 11/24/2023   K 4.9 11/24/2023   CL 97 11/24/2023   CO2 24 11/24/2023   BUN 33 (H) 11/24/2023   CREATININE 1.82 (H) 11/24/2023   GFR 32.73 (L) 11/24/2023   CALCIUM  9.5 11/24/2023   PHOS 3.6 12/03/2022   PROT 7.2 11/06/2023   ALBUMIN 4.4 11/06/2023   BILITOT 0.7 11/06/2023   ALKPHOS 54 11/06/2023   AST 24 11/06/2023   ALT 16 11/06/2023   ANIONGAP 16 (H) 11/06/2023   CT Chest 09/15/23 1. Interval resolution of the tree-in-bud appearance in the right middle lobe. 2. Aortic and coronary artery atherosclerosis (ICD10-I70.0). 3. Gynecomastia.     Assessment & Plan:   Assessment & Plan Lung nodules     Lower extremity edema  Orders:   VAS US  LOWER EXTREMITY VENOUS (DVT); Future   Assessment and Plan Assessment & Plan Lower extremity edema, rule out deep vein thrombosis Intermittent left leg swelling with pain, possible DVT. Discussed DVT risks and need for evaluation. - Ordered bilateral leg ultrasound for DVT evaluation. -  Coordinated with vascular team for ultrasound scheduling.  Lung nodules - resolved on prior imaging 08/2023. No further follow up necessary.    Return if symptoms worsen or fail to improve.   Dorn KATHEE Chill, MD     [1]  Current Outpatient Medications:    acetaminophen  (QC ACETAMINOPHEN  8 HOURS) 650 MG CR tablet, Take 1 tablet (650 mg total) by mouth every 8 (eight) hours as needed for pain., Disp: 90 tablet, Rfl: 0   albuterol  (VENTOLIN  HFA) 108 (90 Base) MCG/ACT inhaler, Inhale 2 puffs into the lungs., Disp: , Rfl:    Amino Acids (AMINO ACID PO), Take 5 tablets by mouth daily., Disp: , Rfl:    amLODipine  (NORVASC ) 10 MG tablet, TAKE 1 TABLET BY MOUTH AT BEDTIME, Disp: 90 tablet, Rfl: 0   Ascorbic Acid (VITAMIN C) 1000 MG tablet, Take 1,000 mg by mouth daily., Disp: , Rfl:    aspirin  EC 81 MG tablet, Take 81 mg by mouth in  the morning. Swallow whole., Disp: , Rfl:    B Complex Vitamins (VITAMIN B-COMPLEX) TABS, Take 1 tablet by mouth every morning., Disp: , Rfl:    benzonatate  (TESSALON ) 200 MG capsule, Take 1 capsule (200 mg total) by mouth 3 (three) times daily as needed for cough., Disp: 30 capsule, Rfl: 0   Cholecalciferol (VITAMIN D3) 50 MCG (2000 UT) TABS, Take 2,000 Units by mouth daily., Disp: , Rfl:    Coenzyme Q10 (CO Q10 PO), Take 30 mLs by mouth daily., Disp: , Rfl:    Cyanocobalamin (VITAMIN B-12) 2500 MCG SUBL, Place 2,500 mcg under the tongue daily., Disp: , Rfl:    diclofenac  Sodium (VOLTAREN ) 1 % GEL, Apply 4 g topically 4 (four) times daily as needed., Disp: 100 g, Rfl: 3   finasteride  (PROSCAR ) 5 MG tablet, Take 1 tablet by mouth once daily, Disp: 90 tablet, Rfl: 0   fluticasone  (FLONASE ) 50 MCG/ACT nasal spray, Place 1 spray into both nostrils daily., Disp: 16 g, Rfl: 3   GARLIC PO, Take by mouth., Disp: , Rfl:    hydrALAZINE  (APRESOLINE ) 10 MG tablet, Take 10 mg (1 table)  3 times day if BP > 160/100, Disp: 270 tablet, Rfl: 3   loratadine  (CLARITIN ) 10 MG tablet, Take 1 tablet (10 mg total) by mouth daily., Disp: 30 tablet, Rfl: 11   MAGNESIUM GLYCINATE PO, Take 240 mg by mouth daily., Disp: , Rfl:    olmesartan -hydrochlorothiazide (BENICAR  HCT) 40-25 MG tablet, Take 1 tablet by mouth daily., Disp: 90 tablet, Rfl: 3   Omega-3 Fatty Acids (FISH OIL PO), Take 1 capsule by mouth daily., Disp: , Rfl:    OVER THE COUNTER MEDICATION, Joint liquid 2,00 glucosamine, 200 chondrotin, Disp: , Rfl:    pantoprazole  (PROTONIX ) 40 MG tablet, Take 1 tablet by mouth once daily, Disp: 90 tablet, Rfl: 0   Probiotic Product (PROBIOTIC PO), Take 1 capsule by mouth daily., Disp: , Rfl:    promethazine -dextromethorphan (PROMETHAZINE -DM) 6.25-15 MG/5ML syrup, Take 5 mLs by mouth 4 (four) times daily as needed for cough., Disp: 118 mL, Rfl: 0   rosuvastatin  (CRESTOR ) 5 MG tablet, Take 1 tablet (5 mg total) by mouth  daily., Disp: 90 tablet, Rfl: 3   spironolactone  (ALDACTONE ) 25 MG tablet, Take 25 mg by mouth daily., Disp: , Rfl:    TURMERIC CURCUMIN PO, Take 1 tablet by mouth daily., Disp: , Rfl:    Zinc  50 MG TABS, Take 50 mg by mouth daily., Disp: , Rfl:   "

## 2024-03-07 ENCOUNTER — Ambulatory Visit (HOSPITAL_COMMUNITY)
Admission: RE | Admit: 2024-03-07 | Discharge: 2024-03-07 | Disposition: A | Discharge status: Home / Self Care | Source: Ambulatory Visit | Attending: Pulmonary Disease | Admitting: Pulmonary Disease

## 2024-03-07 ENCOUNTER — Ambulatory Visit: Payer: Self-pay | Admitting: Pulmonary Disease

## 2024-03-07 DIAGNOSIS — R600 Localized edema: Secondary | ICD-10-CM | POA: Insufficient documentation

## 2024-03-07 NOTE — Progress Notes (Signed)
 Please let patient know the swelling in his legs is not due to a blood clot. This is good news.

## 2024-03-08 ENCOUNTER — Encounter: Payer: Self-pay | Admitting: Pulmonary Disease

## 2024-03-08 NOTE — Telephone Encounter (Signed)
 Tried to reach out to patient VM?LM okay per DPR -   -NFN

## 2024-03-09 NOTE — Telephone Encounter (Signed)
 Copied from CRM [id number]. Topic: Clinical - Lab/Test Results >> Mar 08, 2024  1:00 PM Rilla B wrote: Reason for CRM: Patient returning call to [facility].  Please call patient @ [phone] >> Mar 08, 2024  3:41 PM Rozanna MATSU wrote: Pt returning call to [facility] about lab results of his ultrasound of his leg. Called CAL she was with a patient  I called and spoke to pt. Pt informed of Dr Luann note and pt verbalized understanding. NFN

## 2024-03-24 ENCOUNTER — Ambulatory Visit: Admitting: Family Medicine

## 2024-03-24 ENCOUNTER — Other Ambulatory Visit: Payer: Self-pay | Admitting: Family Medicine

## 2024-04-20 ENCOUNTER — Ambulatory Visit: Admitting: Family Medicine

## 2024-04-27 ENCOUNTER — Ambulatory Visit: Admitting: Family Medicine

## 2024-05-02 ENCOUNTER — Ambulatory Visit: Admitting: Family Medicine

## 2024-06-19 ENCOUNTER — Ambulatory Visit
